# Patient Record
Sex: Female | Born: 1960 | Race: White | Hispanic: No | Smoking: Former smoker
Health system: Southern US, Community
[De-identification: ages and names within clinical notes are randomized; demographics above are authoritative.]

## PROBLEM LIST (undated history)

## (undated) DIAGNOSIS — Z973 Presence of spectacles and contact lenses: Secondary | ICD-10-CM

## (undated) DIAGNOSIS — N12 Tubulo-interstitial nephritis, not specified as acute or chronic: Secondary | ICD-10-CM

## (undated) DIAGNOSIS — M1990 Unspecified osteoarthritis, unspecified site: Secondary | ICD-10-CM

## (undated) DIAGNOSIS — E039 Hypothyroidism, unspecified: Secondary | ICD-10-CM

## (undated) DIAGNOSIS — G4733 Obstructive sleep apnea (adult) (pediatric): Secondary | ICD-10-CM

## (undated) DIAGNOSIS — E7800 Pure hypercholesterolemia, unspecified: Secondary | ICD-10-CM

## (undated) DIAGNOSIS — K589 Irritable bowel syndrome without diarrhea: Secondary | ICD-10-CM

## (undated) DIAGNOSIS — Z8616 Personal history of COVID-19: Secondary | ICD-10-CM

## (undated) DIAGNOSIS — M5416 Radiculopathy, lumbar region: Secondary | ICD-10-CM

## (undated) DIAGNOSIS — F32A Depression, unspecified: Secondary | ICD-10-CM

## (undated) DIAGNOSIS — F419 Anxiety disorder, unspecified: Secondary | ICD-10-CM

## (undated) DIAGNOSIS — M51369 Other intervertebral disc degeneration, lumbar region without mention of lumbar back pain or lower extremity pain: Secondary | ICD-10-CM

## (undated) DIAGNOSIS — M5136 Other intervertebral disc degeneration, lumbar region: Secondary | ICD-10-CM

## (undated) DIAGNOSIS — J449 Chronic obstructive pulmonary disease, unspecified: Secondary | ICD-10-CM

## (undated) DIAGNOSIS — M25512 Pain in left shoulder: Secondary | ICD-10-CM

## (undated) DIAGNOSIS — J189 Pneumonia, unspecified organism: Secondary | ICD-10-CM

## (undated) DIAGNOSIS — E559 Vitamin D deficiency, unspecified: Secondary | ICD-10-CM

## (undated) DIAGNOSIS — Z8619 Personal history of other infectious and parasitic diseases: Secondary | ICD-10-CM

## (undated) DIAGNOSIS — F329 Major depressive disorder, single episode, unspecified: Secondary | ICD-10-CM

## (undated) DIAGNOSIS — I10 Essential (primary) hypertension: Secondary | ICD-10-CM

## (undated) DIAGNOSIS — Z972 Presence of dental prosthetic device (complete) (partial): Secondary | ICD-10-CM

## (undated) DIAGNOSIS — J309 Allergic rhinitis, unspecified: Secondary | ICD-10-CM

## (undated) DIAGNOSIS — M797 Fibromyalgia: Secondary | ICD-10-CM

## (undated) DIAGNOSIS — Z9989 Dependence on other enabling machines and devices: Secondary | ICD-10-CM

## (undated) DIAGNOSIS — Z87448 Personal history of other diseases of urinary system: Secondary | ICD-10-CM

## (undated) DIAGNOSIS — M961 Postlaminectomy syndrome, not elsewhere classified: Secondary | ICD-10-CM

## (undated) DIAGNOSIS — F4310 Post-traumatic stress disorder, unspecified: Secondary | ICD-10-CM

## (undated) DIAGNOSIS — E669 Obesity, unspecified: Secondary | ICD-10-CM

## (undated) DIAGNOSIS — M5030 Other cervical disc degeneration, unspecified cervical region: Secondary | ICD-10-CM

## (undated) DIAGNOSIS — M545 Low back pain, unspecified: Secondary | ICD-10-CM

## (undated) DIAGNOSIS — M65312 Trigger thumb, left thumb: Secondary | ICD-10-CM

## (undated) DIAGNOSIS — J42 Unspecified chronic bronchitis: Secondary | ICD-10-CM

## (undated) DIAGNOSIS — K08109 Complete loss of teeth, unspecified cause, unspecified class: Secondary | ICD-10-CM

## (undated) DIAGNOSIS — G629 Polyneuropathy, unspecified: Secondary | ICD-10-CM

## (undated) DIAGNOSIS — E119 Type 2 diabetes mellitus without complications: Secondary | ICD-10-CM

## (undated) DIAGNOSIS — G8929 Other chronic pain: Secondary | ICD-10-CM

## (undated) DIAGNOSIS — E1142 Type 2 diabetes mellitus with diabetic polyneuropathy: Secondary | ICD-10-CM

## (undated) DIAGNOSIS — K219 Gastro-esophageal reflux disease without esophagitis: Secondary | ICD-10-CM

## (undated) DIAGNOSIS — I2510 Atherosclerotic heart disease of native coronary artery without angina pectoris: Secondary | ICD-10-CM

## (undated) DIAGNOSIS — G894 Chronic pain syndrome: Secondary | ICD-10-CM

## (undated) DIAGNOSIS — E785 Hyperlipidemia, unspecified: Secondary | ICD-10-CM

## (undated) DIAGNOSIS — J45909 Unspecified asthma, uncomplicated: Secondary | ICD-10-CM

## (undated) HISTORY — DX: Polyneuropathy, unspecified: G62.9

## (undated) HISTORY — DX: Major depressive disorder, single episode, unspecified: F32.9

## (undated) HISTORY — PX: CHOLECYSTECTOMY: SHX55

## (undated) HISTORY — DX: Obesity, unspecified: E66.9

## (undated) HISTORY — PX: DILATION AND CURETTAGE OF UTERUS: SHX78

## (undated) HISTORY — PX: BACK SURGERY: SHX140

## (undated) HISTORY — DX: Gastro-esophageal reflux disease without esophagitis: K21.9

## (undated) HISTORY — PX: LAPAROSCOPIC CHOLECYSTECTOMY: SUR755

## (undated) HISTORY — DX: Post-traumatic stress disorder, unspecified: F43.10

## (undated) HISTORY — PX: COLONOSCOPY W/ POLYPECTOMY: SHX1380

## (undated) HISTORY — PX: UPPER GASTROINTESTINAL ENDOSCOPY: SHX188

## (undated) HISTORY — DX: Vitamin D deficiency, unspecified: E55.9

## (undated) HISTORY — DX: Allergic rhinitis, unspecified: J30.9

## (undated) HISTORY — DX: Hyperlipidemia, unspecified: E78.5

## (undated) HISTORY — DX: Essential (primary) hypertension: I10

## (undated) HISTORY — DX: Atherosclerotic heart disease of native coronary artery without angina pectoris: I25.10

## (undated) HISTORY — PX: JOINT REPLACEMENT: SHX530

## (undated) HISTORY — PX: TUBAL LIGATION: SHX77

## (undated) HISTORY — PX: TONSILLECTOMY: SUR1361

## (undated) HISTORY — PX: TRIGGER FINGER RELEASE: SHX641

## (undated) HISTORY — PX: CARPAL TUNNEL RELEASE: SHX101

---
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
## 1995-01-28 HISTORY — PX: LUMBAR DISC SURGERY: SHX700

## 2000-10-23 ENCOUNTER — Encounter: Admission: RE | Admit: 2000-10-23 | Discharge: 2000-10-23 | Payer: Self-pay

## 2001-07-22 ENCOUNTER — Ambulatory Visit (HOSPITAL_BASED_OUTPATIENT_CLINIC_OR_DEPARTMENT_OTHER): Admission: RE | Admit: 2001-07-22 | Discharge: 2001-07-22 | Payer: Self-pay | Admitting: Orthopedic Surgery

## 2001-07-22 HISTORY — PX: CARPAL TUNNEL RELEASE: SHX101

## 2002-11-28 ENCOUNTER — Encounter: Admission: RE | Admit: 2002-11-28 | Discharge: 2002-11-28 | Payer: Self-pay

## 2002-12-15 ENCOUNTER — Encounter
Admission: RE | Admit: 2002-12-15 | Discharge: 2003-03-15 | Payer: Self-pay | Admitting: Physical Medicine & Rehabilitation

## 2003-01-05 ENCOUNTER — Ambulatory Visit (HOSPITAL_BASED_OUTPATIENT_CLINIC_OR_DEPARTMENT_OTHER): Admission: RE | Admit: 2003-01-05 | Discharge: 2003-01-05 | Payer: Self-pay | Admitting: Orthopedic Surgery

## 2003-01-05 HISTORY — PX: CARPAL TUNNEL RELEASE: SHX101

## 2004-01-28 HISTORY — PX: RECTAL PROLAPSE REPAIR: SHX759

## 2004-01-28 HISTORY — PX: BLADDER SUSPENSION: SHX72

## 2004-01-28 HISTORY — PX: ABDOMINAL HYSTERECTOMY: SHX81

## 2006-03-20 ENCOUNTER — Encounter: Admission: RE | Admit: 2006-03-20 | Discharge: 2006-03-20 | Payer: Self-pay | Admitting: Neurosurgery

## 2006-12-08 ENCOUNTER — Encounter: Admission: RE | Admit: 2006-12-08 | Discharge: 2006-12-08 | Payer: Self-pay | Admitting: Neurosurgery

## 2006-12-26 ENCOUNTER — Encounter: Admission: RE | Admit: 2006-12-26 | Discharge: 2006-12-26 | Payer: Self-pay | Admitting: Neurosurgery

## 2010-02-16 ENCOUNTER — Encounter: Payer: Self-pay | Admitting: Orthopedic Surgery

## 2010-02-17 ENCOUNTER — Encounter: Payer: Self-pay | Admitting: Neurosurgery

## 2010-06-14 NOTE — Op Note (Signed)
[place]. [facility]  Patient:    [name], [name] Visit Number: [id number] MRN: [id number]          Service Type: DSU Location: [facility] Attending Physician:  Colbert Ewing Dictated by:   Loreta Ave, M.D. Proc. Date: 07/22/01 Admit Date:  07/22/2001                             Operative Report  PREOPERATIVE DIAGNOSIS:  Right carpal tunnel syndrome.  POSTOPERATIVE DIAGNOSIS:  Right carpal tunnel syndrome.  PROCEDURE:  Right carpal tunnel release.  SURGEON:  Loreta Ave, M.D.  ASSISTANT:  Arlys John D. Petrarca, P.A.-C.  ANESTHESIA:  IV regional.  SPECIMENS:  None.  CULTURES:  None.  COMPLICATIONS:  None.  DRESSING:  Soft compressive with bulky hand dressing and splint.  DESCRIPTION OF PROCEDURE:  Patient brought to the operating room and placed on the operating table in the supine position.  After adequate anesthesia had been obtained, the right arm was prepped and draped in the usual sterile fashion.  A small curved incision over the carpal tunnel heading slightly ulnarward at the distal wrist crease to avoid the palmar branch of the median nerve.  Skin and subcutaneous tissue divided.  Retinaculum over the carpal tunnel exposed and incised under direct visualization from the forearm fascia proximally to the palmar arch distally.  Very tight canal.  Even after release of the retinaculum, marked hourglass constriction of the nerve.  Digital branches, motor branches identified, protected, decompressed.  Epineurotomy, which improved the appearance of the nerve but it still looked a little hourglass with erythema even after that.  No other abnormalities in the carpal canal.  The wound irrigated.  Skin closed with nylon.  Margins of the wound injected with Marcaine.  A sterile compressive dressing with a bulky hand dressing and splint applied.  Anesthesia reversed.  Brought to the recovery room.  Tolerated the surgery well with no  complications. Dictated by:   Loreta Ave, M.D. Attending Physician:  Colbert Ewing DD:  07/22/01 TD:  07/24/01 Job: [id number]

## 2010-06-14 NOTE — Op Note (Signed)
NAMEMarland Kitchen  [name], [name] NO.:  [id number]   MEDICAL RECORD NO.:  [id number]                   PATIENT TYPE:  AMB   LOCATION:  [facility]                                  FACILITY:  [facility]   PHYSICIAN:  Loreta Ave, M.D.              DATE OF BIRTH:  Jun 18, 1960   DATE OF PROCEDURE:  01/05/2003  DATE OF DISCHARGE:                                 OPERATIVE REPORT   PREOPERATIVE DIAGNOSIS:  Left carpal syndrome.   POSTOPERATIVE DIAGNOSIS:  Left carpal tunnel syndrome.   OPERATIVE PROCEDURE:  Left carpal tunnel release.   SURGEON:  Loreta Ave, M.D.   ANESTHESIA:  IV regional.   SPECIMENS:  None.   CULTURES:  None.   COMPLICATIONS:  None.   DRESSINGS:  Soft compressive, bulky hand dressing and splint.   PROCEDURE:  The patient was brought to the operating room and placed on the  operating table in supine position.  After adequate anesthesia had been  obtained, prepped and draped in the usual sterile fashion.  A small curved  incision over the carpal tunnel extending slightly ulnar at the distal wrist  crease avoiding injury to the palmar branch of the median nerve.  The skin  and subcutaneous tissue were divided.  The retinaculum over the carpal  tunnel exposed and incised under direct visualization from the forearm  fascia proximally.  Thickened fibrotic material in the canal debrided.  Moderate hour glass constriction with erythema on the median nerve right in  the middle of the canal.  Improved after carpal tunnel release and  epineurotomy.  Nice release throughout.  Digital branches and motor branches  identified and protected, decompressed.  The wound was irrigated.  The skin  was closed with nylon.  The margins were injected with Marcaine.  Sterile  compressive dressing and splint applied.  Anesthesia reversed.  Brought to  the recovery room.  Tolerated the surgery well without complications.     Loreta Ave, M.D.    DFM/MEDQ  D:  01/05/2003  T:  01/05/2003  Job:  [id number]

## 2011-04-02 DIAGNOSIS — G4733 Obstructive sleep apnea (adult) (pediatric): Secondary | ICD-10-CM

## 2011-04-02 HISTORY — DX: Obstructive sleep apnea (adult) (pediatric): G47.33

## 2012-05-30 ENCOUNTER — Encounter (HOSPITAL_COMMUNITY): Payer: Self-pay

## 2012-05-30 ENCOUNTER — Emergency Department (HOSPITAL_COMMUNITY)
Admission: EM | Admit: 2012-05-30 | Discharge: 2012-05-30 | Disposition: A | Discharge status: Home / Self Care | Payer: Medicaid Other | Attending: Emergency Medicine | Admitting: Emergency Medicine

## 2012-05-30 ENCOUNTER — Emergency Department (HOSPITAL_COMMUNITY): Payer: Medicaid Other

## 2012-05-30 DIAGNOSIS — G8929 Other chronic pain: Secondary | ICD-10-CM | POA: Insufficient documentation

## 2012-05-30 DIAGNOSIS — M25559 Pain in unspecified hip: Secondary | ICD-10-CM | POA: Insufficient documentation

## 2012-05-30 DIAGNOSIS — W010XXA Fall on same level from slipping, tripping and stumbling without subsequent striking against object, initial encounter: Secondary | ICD-10-CM | POA: Insufficient documentation

## 2012-05-30 DIAGNOSIS — Y929 Unspecified place or not applicable: Secondary | ICD-10-CM | POA: Insufficient documentation

## 2012-05-30 DIAGNOSIS — J449 Chronic obstructive pulmonary disease, unspecified: Secondary | ICD-10-CM | POA: Insufficient documentation

## 2012-05-30 DIAGNOSIS — E039 Hypothyroidism, unspecified: Secondary | ICD-10-CM | POA: Insufficient documentation

## 2012-05-30 DIAGNOSIS — M1990 Unspecified osteoarthritis, unspecified site: Secondary | ICD-10-CM

## 2012-05-30 DIAGNOSIS — IMO0001 Reserved for inherently not codable concepts without codable children: Secondary | ICD-10-CM | POA: Insufficient documentation

## 2012-05-30 DIAGNOSIS — E1149 Type 2 diabetes mellitus with other diabetic neurological complication: Secondary | ICD-10-CM | POA: Insufficient documentation

## 2012-05-30 DIAGNOSIS — M549 Dorsalgia, unspecified: Secondary | ICD-10-CM | POA: Insufficient documentation

## 2012-05-30 DIAGNOSIS — R209 Unspecified disturbances of skin sensation: Secondary | ICD-10-CM | POA: Insufficient documentation

## 2012-05-30 DIAGNOSIS — J4489 Other specified chronic obstructive pulmonary disease: Secondary | ICD-10-CM | POA: Insufficient documentation

## 2012-05-30 DIAGNOSIS — M5030 Other cervical disc degeneration, unspecified cervical region: Secondary | ICD-10-CM | POA: Insufficient documentation

## 2012-05-30 DIAGNOSIS — M51379 Other intervertebral disc degeneration, lumbosacral region without mention of lumbar back pain or lower extremity pain: Secondary | ICD-10-CM | POA: Insufficient documentation

## 2012-05-30 DIAGNOSIS — M79609 Pain in unspecified limb: Secondary | ICD-10-CM | POA: Insufficient documentation

## 2012-05-30 DIAGNOSIS — Y9389 Activity, other specified: Secondary | ICD-10-CM | POA: Insufficient documentation

## 2012-05-30 DIAGNOSIS — M5137 Other intervertebral disc degeneration, lumbosacral region: Secondary | ICD-10-CM | POA: Insufficient documentation

## 2012-05-30 DIAGNOSIS — Z87891 Personal history of nicotine dependence: Secondary | ICD-10-CM | POA: Insufficient documentation

## 2012-05-30 DIAGNOSIS — G4730 Sleep apnea, unspecified: Secondary | ICD-10-CM | POA: Insufficient documentation

## 2012-05-30 HISTORY — DX: Chronic obstructive pulmonary disease, unspecified: J44.9

## 2012-05-30 HISTORY — DX: Hypothyroidism, unspecified: E03.9

## 2012-05-30 HISTORY — DX: Other intervertebral disc degeneration, lumbar region without mention of lumbar back pain or lower extremity pain: M51.369

## 2012-05-30 HISTORY — DX: Other cervical disc degeneration, unspecified cervical region: M50.30

## 2012-05-30 HISTORY — DX: Unspecified asthma, uncomplicated: J45.909

## 2012-05-30 HISTORY — DX: Fibromyalgia: M79.7

## 2012-05-30 HISTORY — DX: Other intervertebral disc degeneration, lumbar region: M51.36

## 2012-05-30 MED ORDER — KETOROLAC TROMETHAMINE 30 MG/ML IJ SOLN
30.0000 mg | Freq: Once | INTRAMUSCULAR | Status: AC
  Administered 2012-05-30: 30 mg via INTRAVENOUS
  Filled 2012-05-30: qty 1

## 2012-05-30 MED ORDER — HYDROMORPHONE HCL PF 1 MG/ML IJ SOLN
1.0000 mg | Freq: Once | INTRAMUSCULAR | Status: AC
  Administered 2012-05-30: 1 mg via INTRAVENOUS
  Filled 2012-05-30: qty 1

## 2012-05-30 MED ORDER — DIAZEPAM 5 MG/ML IJ SOLN
5.0000 mg | Freq: Once | INTRAMUSCULAR | Status: AC
  Administered 2012-05-30: 5 mg via INTRAVENOUS
  Filled 2012-05-30: qty 2

## 2012-05-30 NOTE — ED Notes (Signed)
Patient presents with c/o left hip pain. Reports that she "tripped" yesterday while walking but "caught" herself. Denies falling. No immediate pain afterwards. Woke up this AM with pain to left hip that has progressively gotten worse throughout the day. Pain is from the hip and radiates down to the posterior and lateral aspect of leg, into the knee and ankle. Endorses some numbness and tingling to LLE "at times." Patient has hx chronic pain d/t DDD and fibromyalgia. Patient receives narcotic pain medication from pain clinic because of this. Takes Morphine 30 mg BID and Lyrica TID. Patient reports no relief in current pain with prescribed medications. To note, patient has an appointment at pain clinic tomorrow.

## 2012-05-30 NOTE — ED Provider Notes (Signed)
History     CSN: [id number]  Arrival date & time 05/30/12  1940   First MD Initiated Contact with Patient 05/30/12 2034      Chief Complaint  Patient presents with  . Hip Pain    (Consider location/radiation/quality/duration/timing/severity/associated sxs/prior treatment) The history is provided by the patient.  [name] is a 52 y.o. female history of arthritis, fibromyalgia, COPD, chronic pain here presenting with left hip pain. She tripped over something yesterday but caught herself. Did not fall on her buttocks or her head. However today she had increasing pain when she ambulates. She had some tingling down her left leg but denies any weakness. She has a pain specialist and took her pain medicines without relief. Denies any groin numbness or difficulty urinating.    Past Medical History  Diagnosis Date  . DDD (degenerative disc disease), cervical   . DDD (degenerative disc disease), lumbar   . Fibromyalgia   . Diabetes mellitus without complication   . COPD (chronic obstructive pulmonary disease)   . Neuropathy   . Hypothyroid   . Asthma   . Chronic pain   . Sleep apnea     w/ CPAP    Past Surgical History  Procedure Laterality Date  . Back surgery    . Abdominal hysterectomy    . Tubal ligation    . Bladder surgery    . Rectal surgery    . Carpal tunnel release Bilateral   . Cholecystectomy      No family history on file.  History  Substance Use Topics  . Smoking status: Former Smoker    Types: Cigarettes    Quit date: 12/01/2007  . Smokeless tobacco: Never Used  . Alcohol Use: No    OB History   Grav Para Term Preterm Abortions TAB SAB Ect Mult Living                  Review of Systems  Musculoskeletal: Positive for back pain.       L leg paresthesia   All other systems reviewed and are negative.    Allergies  Latex  Home Medications  No current outpatient prescriptions on file.  BP 144/70  Pulse 94  Temp(Src) 98.2 F  (36.8 C) (Oral)  Resp 16  SpO2 96%  Physical Exam  Nursing note and vitals reviewed. Constitutional: She is oriented to person, place, and time.  Uncomfortable   HENT:  Head: Normocephalic.  Mouth/Throat: Oropharynx is clear and moist.  Eyes: Conjunctivae are normal. Pupils are equal, round, and reactive to light.  Neck: Normal range of motion. Neck supple.  Cardiovascular: Normal rate, regular rhythm and normal heart sounds.   Pulmonary/Chest: Effort normal and breath sounds normal. No respiratory distress. She has no wheezes. She has no rales.  Abdominal: Soft. Bowel sounds are normal. She exhibits no distension. There is no tenderness. There is no rebound and no guarding.  Musculoskeletal:  Dec L hip ROM from pain. 2+ pulses, nl sensation. No midline lumbar tenderness.   Neurological: She is alert and oriented to person, place, and time.  No saddle anesthesia   Skin: Skin is warm and dry.  Psychiatric: She has a normal mood and affect. Her behavior is normal. Judgment and thought content normal.    ED Course  Procedures (including critical care time)  Labs Reviewed - No data to display Dg Lumbar Spine Complete  05/30/2012  *RADIOLOGY REPORT*  Clinical Data: Fall, back pain, left hip pain.  LUMBAR SPINE -  COMPLETE 4+ VIEW  Comparison: Lumbar spine MRI 12/26/2006  Findings: Mild degenerative facet disease in the mid and lower lumbar spine.  Early degenerative disc disease changes in the lower lumbar spine.  Minimal spurring anteriorly.  No fracture.  No malalignment.  SI joints are symmetric and unremarkable.  IMPRESSION: Mild degenerative changes as above.  No acute findings.   Original Report Authenticated By: Charlett Nose, M.D.    Dg Hip Complete Left  05/30/2012  *RADIOLOGY REPORT*  Clinical Data: The left hip pain, chronic.  LEFT HIP - COMPLETE 2+ VIEW  Comparison: MRI 03/20/2006  Findings: Degenerative changes within the hips bilaterally, left greater than right.  Joint space  loss, spurring and subchondral sclerosis.  SI joints are symmetric and unremarkable. No acute bony abnormality.  Specifically, no fracture, subluxation, or dislocation.  Soft tissues are intact.  IMPRESSION: Moderate degenerative changes in the hips bilaterally, left greater than right.  No acute findings.   Original Report Authenticated By: Charlett Nose, M.D.      No diagnosis found.    MDM  [name] is a 52 y.o. female here with worsening of chronic leg pain. Xrays showed arthritis. No red flags requiring MRI. Will give pain meds and reassess.   10:28 PM Pain improved with toradol, valium and dilaudid. She will see her pain specialist tomorrow to get her refill for pain meds.         Richardean Canal, MD 05/30/12 2229

## 2012-11-17 ENCOUNTER — Encounter (HOSPITAL_COMMUNITY): Payer: Self-pay | Admitting: Pharmacy Technician

## 2012-11-18 NOTE — H&P (Signed)
HISTORY AND PHYSICAL  [name] is a 52 y.o. female patient with CC: Painful teeth  No diagnosis found.  Past Medical History  Diagnosis Date  . DDD (degenerative disc disease), cervical   . DDD (degenerative disc disease), lumbar   . Fibromyalgia   . Diabetes mellitus without complication   . COPD (chronic obstructive pulmonary disease)   . Neuropathy   . Hypothyroid   . Asthma   . Chronic pain   . Sleep apnea     w/ CPAP    No current facility-administered medications for this encounter.   Current Outpatient Prescriptions  Medication Sig Dispense Refill  . bismuth-metronidazole-tetracycline (PYLERA) 140-125-125 MG per capsule Take 3 capsules by mouth 4 (four) times daily -  before meals and at bedtime.      Marland Kitchen escitalopram (LEXAPRO) 20 MG tablet Take 20 mg by mouth daily.      Marland Kitchen FIBER COMPLETE PO Take 625 mg by mouth 2 (two) times daily.      Marland Kitchen glyBURIDE (DIABETA) 5 MG tablet Take 5 mg by mouth 2 (two) times daily with a meal.      . levothyroxine (SYNTHROID, LEVOTHROID) 50 MCG tablet Take 50 mcg by mouth daily before breakfast.      . metFORMIN (GLUCOPHAGE) 500 MG tablet Take 500 mg by mouth 2 (two) times daily with a meal.      . morphine (MS CONTIN) 30 MG 12 hr tablet Take 30 mg by mouth 2 (two) times daily.      . pantoprazole (PROTONIX) 40 MG tablet Take 40 mg by mouth 2 (two) times daily.      . temazepam (RESTORIL) 30 MG capsule Take 30 mg by mouth at bedtime as needed for sleep.       Allergies  Allergen Reactions  . Latex Itching   Active Problems:   * No active hospital problems. *  Vitals: There were no vitals taken for this visit. Lab results:No results found for this or any previous visit (from the past 24 hour(s)). Radiology Results: No results found. General appearance: alert, cooperative and moderately obese Head: Normocephalic, without obvious abnormality, atraumatic Eyes: negative Ears: normal TM's and external ear canals both  ears Nose: Nares normal. Septum midline. Mucosa normal. No drainage or sinus tenderness. Throat: Dental caries teeth # 4, 5, 6, 10, 11, 12, 13, 15, 21, 22, 27, 28, 31, 32 Neck: no adenopathy, supple, symmetrical, trachea midline and thyroid not enlarged, symmetric, no tenderness/mass/nodules Resp: clear to auscultation bilaterally Cardio: regular rate and rhythm, S1, S2 normal, no murmur, click, rub or gallop  Assessment: Non-restorable teeth #'s 4, 5, 6, 10, 11, 12, 13, 15, 21, 22, 27, 28, 31, 32.   Plan: extraction teeth #'s 4, 5, 6, 10, 11, 12, 13, 15, 21, 22, 27, 28, 31, 32, alveoloplasty. Day surgery. General anesthesia.   [name] 11/18/2012

## 2012-11-23 ENCOUNTER — Other Ambulatory Visit (HOSPITAL_COMMUNITY): Payer: Self-pay | Admitting: *Deleted

## 2012-11-23 ENCOUNTER — Encounter (HOSPITAL_COMMUNITY)
Admission: RE | Admit: 2012-11-23 | Discharge: 2012-11-23 | Disposition: A | Discharge status: Home / Self Care | Payer: Medicaid Other | Source: Ambulatory Visit | Attending: Oral Surgery | Admitting: Oral Surgery

## 2012-11-23 ENCOUNTER — Ambulatory Visit (HOSPITAL_COMMUNITY)
Admission: RE | Admit: 2012-11-23 | Discharge: 2012-11-23 | Disposition: A | Discharge status: Home / Self Care | Payer: Medicaid Other | Source: Ambulatory Visit | Attending: Oral Surgery | Admitting: Oral Surgery

## 2012-11-23 ENCOUNTER — Encounter (HOSPITAL_COMMUNITY): Payer: Self-pay

## 2012-11-23 DIAGNOSIS — Z01812 Encounter for preprocedural laboratory examination: Secondary | ICD-10-CM | POA: Insufficient documentation

## 2012-11-23 DIAGNOSIS — J449 Chronic obstructive pulmonary disease, unspecified: Secondary | ICD-10-CM | POA: Insufficient documentation

## 2012-11-23 DIAGNOSIS — J4489 Other specified chronic obstructive pulmonary disease: Secondary | ICD-10-CM | POA: Insufficient documentation

## 2012-11-23 DIAGNOSIS — Z01818 Encounter for other preprocedural examination: Secondary | ICD-10-CM | POA: Insufficient documentation

## 2012-11-23 HISTORY — DX: Depression, unspecified: F32.A

## 2012-11-23 HISTORY — DX: Irritable bowel syndrome, unspecified: K58.9

## 2012-11-23 HISTORY — DX: Anxiety disorder, unspecified: F41.9

## 2012-11-23 HISTORY — DX: Major depressive disorder, single episode, unspecified: F32.9

## 2012-11-23 LAB — BASIC METABOLIC PANEL
BUN: 17 mg/dL (ref 6–23)
Chloride: 102 mEq/L (ref 96–112)
GFR calc Af Amer: >90 mL/min (ref >90)
GFR calc non Af Amer: >90 mL/min (ref >90)
Glucose, Bld: 88 mg/dL (ref 70–99)
Potassium: 4.1 mEq/L (ref 3.5–5.1)
Sodium: 140 mEq/L (ref 135–145)

## 2012-11-23 LAB — CBC
HCT: 41.8 % (ref 36.0–46.0)
Hemoglobin: 13.8 g/dL (ref 12.0–15.0)
MCH: 30 pg (ref 26.0–34.0)
RBC: 4.6 MIL/uL (ref 3.87–5.11)

## 2012-11-23 NOTE — Pre-Procedure Instructions (Signed)
[name]  11/23/2012   Your procedure is scheduled on:  Friday, November 26, 2012 at 9:50 AM.   Report to [facility] Entrance "A" at 7:45 AM.   Call this number if you have problems the morning of surgery: [phone]   Remember:   Do not eat food or drink liquids after midnight Thursday, 11/15/12.   Take these medicines the morning of surgery with A SIP OF WATER: escitalopram (LEXAPRO), levothyroxine (SYNTHROID, LEVOTHROID), morphine (MS CONTIN), pantoprazole (PROTONIX)                 Do not wear jewelry, make-up or nail polish.  Do not wear lotions, powders, or perfumes. You may wear deodorant.  Do not shave 48 hours prior to surgery. .  Do not bring valuables to the hospital.  [facility] is not responsible                  for any belongings or valuables.               Contacts, dentures or bridgework may not be worn into surgery.  Leave suitcase in the car. After surgery it may be brought to your room.  For patients admitted to the hospital, discharge time is determined by your                treatment team.               Patients discharged the day of surgery will not be allowed to drive  home.  Name and phone number of your driver: Family/friend   Special Instructions: Shower using CHG 2 nights before surgery and the night before surgery.  If you shower the day of surgery use CHG.  Use special wash - you have one bottle of CHG for all showers.  You should use approximately 1/3 of the bottle for each shower.   Please read over the following fact sheets that you were given: Pain Booklet, Coughing and Deep Breathing and Surgical Site Infection Prevention

## 2012-11-24 NOTE — Progress Notes (Signed)
Message left at [facility] pulmonary to request sleep study and stress test.  EKG place in chart from [facility].

## 2012-11-25 MED ORDER — CEFAZOLIN SODIUM-DEXTROSE 2-3 GM-% IV SOLR
2.0000 g | INTRAVENOUS | Status: AC
  Administered 2012-11-26: 2 g via INTRAVENOUS
  Filled 2012-11-25: qty 50

## 2012-11-25 NOTE — Progress Notes (Signed)
Sleep study received and placed on chart.  No stress test received.

## 2012-11-26 ENCOUNTER — Ambulatory Visit (HOSPITAL_COMMUNITY): Payer: Medicaid Other | Admitting: Anesthesiology

## 2012-11-26 ENCOUNTER — Encounter (HOSPITAL_COMMUNITY): Payer: Self-pay | Admitting: Certified Registered"

## 2012-11-26 ENCOUNTER — Ambulatory Visit (HOSPITAL_COMMUNITY)
Admission: RE | Admit: 2012-11-26 | Discharge: 2012-11-26 | Disposition: A | Discharge status: Home / Self Care | Payer: Medicaid Other | Source: Ambulatory Visit | Attending: Oral Surgery | Admitting: Oral Surgery

## 2012-11-26 ENCOUNTER — Encounter (HOSPITAL_COMMUNITY)
Admission: RE | Disposition: A | Discharge status: Home / Self Care | Payer: Self-pay | Source: Ambulatory Visit | Attending: Oral Surgery

## 2012-11-26 ENCOUNTER — Encounter (HOSPITAL_COMMUNITY): Payer: Medicaid Other | Admitting: Anesthesiology

## 2012-11-26 DIAGNOSIS — IMO0001 Reserved for inherently not codable concepts without codable children: Secondary | ICD-10-CM | POA: Insufficient documentation

## 2012-11-26 DIAGNOSIS — K219 Gastro-esophageal reflux disease without esophagitis: Secondary | ICD-10-CM | POA: Insufficient documentation

## 2012-11-26 DIAGNOSIS — K029 Dental caries, unspecified: Secondary | ICD-10-CM

## 2012-11-26 DIAGNOSIS — K0530 Chronic periodontitis, unspecified: Secondary | ICD-10-CM

## 2012-11-26 DIAGNOSIS — J449 Chronic obstructive pulmonary disease, unspecified: Secondary | ICD-10-CM | POA: Insufficient documentation

## 2012-11-26 DIAGNOSIS — E119 Type 2 diabetes mellitus without complications: Secondary | ICD-10-CM | POA: Insufficient documentation

## 2012-11-26 DIAGNOSIS — M2749 Other cysts of jaw: Secondary | ICD-10-CM | POA: Insufficient documentation

## 2012-11-26 DIAGNOSIS — E039 Hypothyroidism, unspecified: Secondary | ICD-10-CM | POA: Insufficient documentation

## 2012-11-26 DIAGNOSIS — K0889 Other specified disorders of teeth and supporting structures: Secondary | ICD-10-CM | POA: Insufficient documentation

## 2012-11-26 DIAGNOSIS — F411 Generalized anxiety disorder: Secondary | ICD-10-CM | POA: Insufficient documentation

## 2012-11-26 DIAGNOSIS — M2740 Unspecified cyst of jaw: Secondary | ICD-10-CM

## 2012-11-26 DIAGNOSIS — J4489 Other specified chronic obstructive pulmonary disease: Secondary | ICD-10-CM | POA: Insufficient documentation

## 2012-11-26 DIAGNOSIS — F329 Major depressive disorder, single episode, unspecified: Secondary | ICD-10-CM | POA: Insufficient documentation

## 2012-11-26 DIAGNOSIS — F3289 Other specified depressive episodes: Secondary | ICD-10-CM | POA: Insufficient documentation

## 2012-11-26 HISTORY — PX: MULTIPLE EXTRACTIONS WITH ALVEOLOPLASTY: SHX5342

## 2012-11-26 LAB — GLUCOSE, CAPILLARY
Glucose-Capillary: 119 mg/dL — ABNORMAL HIGH (ref 70–99)
Glucose-Capillary: 107 mg/dL — ABNORMAL HIGH (ref 70–99)
Glucose-Capillary: 142 mg/dL — ABNORMAL HIGH (ref 70–99)

## 2012-11-26 SURGERY — MULTIPLE EXTRACTION WITH ALVEOLOPLASTY
Anesthesia: General | Wound class: Clean Contaminated

## 2012-11-26 MED ORDER — ONDANSETRON HCL 4 MG/2ML IJ SOLN
INTRAMUSCULAR | Status: DC | PRN
  Administered 2012-11-26: 4 mg via INTRAVENOUS

## 2012-11-26 MED ORDER — ONDANSETRON HCL 4 MG/2ML IJ SOLN: 4.0000 mg | Freq: Once | INTRAMUSCULAR | Status: DC | PRN

## 2012-11-26 MED ORDER — LACTATED RINGERS IV SOLN
INTRAVENOUS | Status: DC
  Administered 2012-11-26: 50 mL/h via INTRAVENOUS

## 2012-11-26 MED ORDER — MIDAZOLAM HCL 5 MG/5ML IJ SOLN
INTRAMUSCULAR | Status: DC | PRN
  Administered 2012-11-26: 2 mg via INTRAVENOUS

## 2012-11-26 MED ORDER — HYDROMORPHONE HCL PF 1 MG/ML IJ SOLN
INTRAMUSCULAR | Status: AC
  Filled 2012-11-26: qty 1

## 2012-11-26 MED ORDER — MEPERIDINE HCL 25 MG/ML IJ SOLN: 6.2500 mg | INTRAMUSCULAR | Status: DC | PRN

## 2012-11-26 MED ORDER — AMOXICILLIN 500 MG PO CAPS: 500.0000 mg | ORAL_CAPSULE | Freq: Three times a day (TID) | ORAL | Status: DC

## 2012-11-26 MED ORDER — OXYCODONE HCL 5 MG PO TABS
5.0000 mg | ORAL_TABLET | Freq: Once | ORAL | Status: AC | PRN
  Administered 2012-11-26: 5 mg via ORAL

## 2012-11-26 MED ORDER — PHENYLEPHRINE HCL 10 MG/ML IJ SOLN
INTRAMUSCULAR | Status: DC | PRN
  Administered 2012-11-26 (×2): 40 ug via INTRAVENOUS

## 2012-11-26 MED ORDER — LIDOCAINE HCL (CARDIAC) 20 MG/ML IV SOLN
INTRAVENOUS | Status: DC | PRN
  Administered 2012-11-26: 80 mg via INTRAVENOUS

## 2012-11-26 MED ORDER — OXYCODONE-ACETAMINOPHEN 10-325 MG PO TABS: 1.0000 | ORAL_TABLET | ORAL | Status: DC | PRN

## 2012-11-26 MED ORDER — LIDOCAINE-EPINEPHRINE 2 %-1:100000 IJ SOLN
INTRAMUSCULAR | Status: AC
  Filled 2012-11-26: qty 1

## 2012-11-26 MED ORDER — HYDROMORPHONE HCL PF 1 MG/ML IJ SOLN
0.2500 mg | INTRAMUSCULAR | Status: DC | PRN
  Administered 2012-11-26 (×4): 0.5 mg via INTRAVENOUS

## 2012-11-26 MED ORDER — SODIUM CHLORIDE 0.9 % IR SOLN
Status: DC | PRN
  Administered 2012-11-26: 1000 mL

## 2012-11-26 MED ORDER — LACTATED RINGERS IV SOLN
INTRAVENOUS | Status: DC | PRN
  Administered 2012-11-26: 10:00:00 via INTRAVENOUS

## 2012-11-26 MED ORDER — LIDOCAINE-EPINEPHRINE 2 %-1:100000 IJ SOLN
INTRAMUSCULAR | Status: DC | PRN
  Administered 2012-11-26: 14 mL

## 2012-11-26 MED ORDER — PROPOFOL 10 MG/ML IV BOLUS
INTRAVENOUS | Status: DC | PRN
  Administered 2012-11-26: 200 mg via INTRAVENOUS

## 2012-11-26 MED ORDER — HYDROMORPHONE HCL PF 1 MG/ML IJ SOLN
INTRAMUSCULAR | Status: AC
  Administered 2012-11-26: 0.5 mg via INTRAVENOUS
  Filled 2012-11-26: qty 1

## 2012-11-26 MED ORDER — OXYCODONE HCL 5 MG PO TABS
ORAL_TABLET | ORAL | Status: AC
  Filled 2012-11-26: qty 1

## 2012-11-26 MED ORDER — FENTANYL CITRATE 0.05 MG/ML IJ SOLN
INTRAMUSCULAR | Status: DC | PRN
  Administered 2012-11-26: 50 ug via INTRAVENOUS
  Administered 2012-11-26: 150 ug via INTRAVENOUS

## 2012-11-26 MED ORDER — OXYCODONE HCL 5 MG/5ML PO SOLN: 5.0000 mg | Freq: Once | ORAL | Status: AC | PRN

## 2012-11-26 MED ORDER — SUCCINYLCHOLINE CHLORIDE 20 MG/ML IJ SOLN
INTRAMUSCULAR | Status: DC | PRN
  Administered 2012-11-26: 100 mg via INTRAVENOUS

## 2012-11-26 SURGICAL SUPPLY — 27 items
BUR CROSS CUT FISSURE 1.6 (BURR) ×2 IMPLANT
BUR EGG ELITE 4.0 (BURR) IMPLANT
CANISTER SUCTION 2500CC (MISCELLANEOUS) ×2 IMPLANT
CLOTH BEACON ORANGE TIMEOUT ST (SAFETY) ×2 IMPLANT
COVER SURGICAL LIGHT HANDLE (MISCELLANEOUS) ×2 IMPLANT
CRADLE DONUT ADULT HEAD (MISCELLANEOUS) ×2 IMPLANT
DECANTER SPIKE VIAL GLASS SM (MISCELLANEOUS) ×2 IMPLANT
GAUZE PACKING FOLDED 2  STR (GAUZE/BANDAGES/DRESSINGS) ×1
GAUZE PACKING FOLDED 2 STR (GAUZE/BANDAGES/DRESSINGS) ×1 IMPLANT
GLOVE BIO SURGEON STRL SZ 6.5 (GLOVE) ×2 IMPLANT
GLOVE BIO SURGEON STRL SZ7.5 (GLOVE) ×2 IMPLANT
GLOVE BIOGEL PI IND STRL 7.0 (GLOVE) ×1 IMPLANT
GLOVE BIOGEL PI INDICATOR 7.0 (GLOVE) ×1
GOWN STRL NON-REIN LRG LVL3 (GOWN DISPOSABLE) ×2 IMPLANT
GOWN STRL REIN XL XLG (GOWN DISPOSABLE) ×2 IMPLANT
KIT BASIN OR (CUSTOM PROCEDURE TRAY) ×2 IMPLANT
KIT ROOM TURNOVER OR (KITS) ×2 IMPLANT
NEEDLE 22X1 1/2 (OR ONLY) (NEEDLE) ×2 IMPLANT
NS IRRIG 1000ML POUR BTL (IV SOLUTION) ×2 IMPLANT
PAD ARMBOARD 7.5X6 YLW CONV (MISCELLANEOUS) ×4 IMPLANT
SUT CHROMIC 3 0 PS 2 (SUTURE) ×2 IMPLANT
SYR CONTROL 10ML LL (SYRINGE) ×2 IMPLANT
TOWEL OR 17X26 10 PK STRL BLUE (TOWEL DISPOSABLE) ×2 IMPLANT
TRAY ENT MC OR (CUSTOM PROCEDURE TRAY) ×2 IMPLANT
TUBING IRRIGATION (MISCELLANEOUS) IMPLANT
WATER STERILE IRR 1000ML POUR (IV SOLUTION) IMPLANT
YANKAUER SUCT BULB TIP NO VENT (SUCTIONS) ×2 IMPLANT

## 2012-11-26 NOTE — Op Note (Signed)
11/26/2012  10:55 AM  PATIENT:  [name]  52 y.o. female  PRE-OPERATIVE DIAGNOSIS:  non restorable teeth #'s 4, 5, 6, 10, 11, 12, 13, 15, 21, 22, 27, 28, 31, 32  POST-OPERATIVE DIAGNOSIS:  SAME + cyst right maxilla  PROCEDURE:  Procedure(s): MULTIPLE EXTRACTION teeth #'s 4, 5, 6, 10, 11, 12, 13, 15, 21, 22, 27, 28, 31, 32 WITH ALVEOLOPLASTY, removal cyst right maxilla  SURGEON:  Surgeon(s): [name], DDS  ANESTHESIA:   local and general  EBL:  minimal  DRAINS: none   SPECIMEN:  Cyst right maxilla  COUNTS:  YES  PLAN OF CARE: Discharge to home after PACU  PATIENT DISPOSITION:  PACU - hemodynamically stable.   PROCEDURE DETAILS: Dictation # [id number]  [name], DMD 11/26/2012 10:55 AM

## 2012-11-26 NOTE — H&P (Signed)
H&P documentation  -History and Physical Reviewed  -Patient has been re-examined  -No change in the plan of care  [name] M  

## 2012-11-26 NOTE — Preoperative (Signed)
Beta Blockers   Reason not to administer Beta Blockers:Not Applicable 

## 2012-11-26 NOTE — Anesthesia Preprocedure Evaluation (Addendum)
Anesthesia Evaluation  Patient identified by MRN, date of birth, ID band Patient awake    Reviewed: Allergy & Precautions, H&P , NPO status , Patient's Chart, lab work & pertinent test results, reviewed documented beta blocker date and time   Airway Mallampati: I TM Distance: >3 FB Neck ROM: Full    Dental  (+) Missing, Dental Advisory Given and Poor Dentition   Pulmonary shortness of breath and with exertion, asthma , sleep apnea , COPD COPD inhaler,  breath sounds clear to auscultation        Cardiovascular Rhythm:Regular     Neuro/Psych Anxiety Depression  Neuromuscular disease    GI/Hepatic GERD-  Medicated and Controlled,  Endo/Other  diabetes, Type 2, Oral Hypoglycemic AgentsHypothyroidism   Renal/GU      Musculoskeletal  (+) Fibromyalgia -  Abdominal (+)  Abdomen: soft. Bowel sounds: normal.  Peds  Hematology   Anesthesia Other Findings   Reproductive/Obstetrics                        Anesthesia Physical Anesthesia Plan  ASA: III  Anesthesia Plan: General   Post-op Pain Management:    Induction: Intravenous  Airway Management Planned: Nasal ETT  Additional Equipment:   Intra-op Plan:   Post-operative Plan: Extubation in OR  Informed Consent: I have reviewed the patients History and Physical, chart, labs and discussed the procedure including the risks, benefits and alternatives for the proposed anesthesia with the patient or authorized representative who has indicated his/her understanding and acceptance.     Plan Discussed with: CRNA and Surgeon  Anesthesia Plan Comments:         Anesthesia Quick Evaluation

## 2012-11-26 NOTE — Anesthesia Procedure Notes (Signed)
Procedure Name: Intubation Date/Time: 11/26/2012 10:11 AM Performed by: Ellin Goodie Pre-anesthesia Checklist: Patient identified, Emergency Drugs available, Patient being monitored, Suction available and Timeout performed Patient Re-evaluated:Patient Re-evaluated prior to inductionOxygen Delivery Method: Circle system utilized Preoxygenation: Pre-oxygenation with 100% oxygen Intubation Type: IV induction Ventilation: Mask ventilation without difficulty Laryngoscope Size: Mac and 3 Grade View: Grade I Nasal Tubes: Nasal prep performed, Nasal Rae, Right and Magill forceps- large, utilized Tube size: 7.0 mm Number of attempts: 1 Placement Confirmation: ETT inserted through vocal cords under direct vision,  positive ETCO2 and breath sounds checked- equal and bilateral Tube secured with: Tape Dental Injury: Teeth and Oropharynx as per pre-operative assessment  Comments: Easy induction and nasal intubation.  MAC 3 blade used to visualize vocal cords and Magill forceps used to pass ETT through cords.  Dr. Michelle Piper verified placement of ETT.  Carlynn Herald, CRNA

## 2012-11-26 NOTE — Transfer of Care (Signed)
Immediate Anesthesia Transfer of Care Note  Patient: [name]  Procedure(s) Performed: Procedure(s): MULTIPLE EXTRACTION WITH ALVEOLOPLASTY (N/A)  Patient Location: PACU  Anesthesia Type:General  Level of Consciousness: awake, alert  and oriented  Airway & Oxygen Therapy: Patient Spontanous Breathing  Post-op Assessment: Report given to PACU RN  Post vital signs: stable  Complications: No apparent anesthesia complications

## 2012-11-26 NOTE — Anesthesia Postprocedure Evaluation (Signed)
Anesthesia Post Note  Patient: [name]  Procedure(s) Performed: Procedure(s) (LRB): MULTIPLE EXTRACTION WITH ALVEOLOPLASTY (N/A)  Anesthesia type: general  Patient location: PACU  Post pain: Pain level controlled  Post assessment: Patient's Cardiovascular Status Stable  Last Vitals:  Filed Vitals:   11/26/12 1215  BP:   Pulse: 80  Temp:   Resp: 24    Post vital signs: Reviewed and stable  Level of consciousness: sedated  Complications: No apparent anesthesia complications

## 2012-11-27 NOTE — Op Note (Signed)
NAME:  [name], [name]  ACCOUNT NO.:  [id number]  MEDICAL RECORD NO.:  [id number]  LOCATION:  [facility]                         FACILITY:  [facility]  PHYSICIAN:  [name], M.D.  DATE OF BIRTH:  Jun 24, 1960  DATE OF PROCEDURE: DATE OF DISCHARGE:  11/26/2012                              OPERATIVE REPORT   PREOPERATIVE DIAGNOSIS:  Nonrestorable teeth numbers 4, 5, 6, 10, 11, 12, 13, 15, 21, 22, 27, 28, 31, 32.  POSTOPERATIVE DIAGNOSIS:  Nonrestorable teeth numbers 4, 5, 6, 10, 11, 12, 13, 15, 21, 22, 27, 28, 31, 32 plus cyst right maxilla.  PROCEDURE:  Extraction of teeth numbers 4, 5, 6, 10, 11, 12, 13, 15, 21, 22, 27, 28, 31, 32.  Alveoplasty of right and left maxilla and mandible, removal of cyst right maxilla.  SURGEON:  [name], M.D.  ANESTHESIA:  General.  Dr. Michelle Piper, attending nasal intubation.  DESCRIPTION OF PROCEDURE:  The patient was taken to the operating room, placed on the table in supine position.  General anesthesia was administered intravenously and a nasal endotracheal tube was placed and secured.  The eyes were protected.  The patient was draped for the procedure.  Time-out was performed.  The posterior pharynx was suctioned.  The throat pack was placed.  A 2% lidocaine with 1:100,000 epinephrine was infiltrated in an inferior alveolar block on the right and left side and buccal and palatal infiltration in the maxilla.  Total of 14 mL was utilized.  The bite block was placed in the right side of the mouth and a sweetheart retractor was used to retract the tongue.  A 15 blade was used to make an incision around teeth numbers 21, 22, on the buccal and lingual surfaces with proximal and distal extensions and wedge excisions in the mandible and then in the maxilla.  A 15 blade used to make an incision around teeth numbers 10, 11, 12, 13, 15.  The periosteum was then reflected from around the teeth and the teeth were elevated with a 301 elevator.  The  lower teeth were removed with Asch forceps and the upper teeth were removed with the rongeur and the #150 universal forceps.  The sockets were curetted and the periosteum was reflected to expose the alveolar crest in the maxilla and mandible on the left side, an egg-shaped bur under irrigation and a bone file was used to perform alveoplasty.  Then, these areas were closed with 3-0 chromic and the bite block and sweetheart retractor were repositioned to the other side of the mouth.  A 15 blade was then used to make an incision around teeth numbers 27, 28, 31, and 32.  On the buccal and lingual aspects in the maxilla around teeth numbers 2, 4, 5, and 6 with a proximal and distal wedge excision to allow for primary closure. Then, the periosteum was reflected and the teeth were elevated with a 301 elevator.  The lower teeth were removed with the Asch forceps in the anterior region and a #151 forceps in the posterior region in the maxilla.  The teeth were removed with the #150 forceps.  Then, the sockets were curetted.  Upon curetting in the area of tooth #6 and 5, there was a large bony defect  apical to the teeth and this area was opened further and found to have a cyst present.  Portions of this cyst was suctioned but another portion of the cyst was removed and submitted for pathology in formalin.  Then, the periosteum was reflected in the maxilla and mandible on the right side.  Then, the bone file and egg- shaped bur were used to perform the alveoplasty.  Then, the mandible was closed with 3-0 chromic after irrigating and the maxilla was closed with 3-0 chromic after irrigating.  Care was taken to perform primary closure in the maxilla so that there was no oral communication with the cyst area.  The oral cavity was then inspected found to have good contour hemostasis and closure.  The oral cavity was irrigated and suctioned. The throat pack was removed.  The patient was awakened, taken to  the recovery room, breathing spontaneously in good condition.  ESTIMATED BLOOD LOSS:  Minimum.  COMPLICATIONS:  None.  SPECIMEN:  Cyst right maxilla.     [name], M.D.     SMJ/MEDQ  D:  11/26/2012  T:  11/27/2012  Job:  [id number]

## 2012-11-30 ENCOUNTER — Encounter (HOSPITAL_COMMUNITY): Payer: Self-pay | Admitting: Oral Surgery

## 2013-04-28 DIAGNOSIS — M1990 Unspecified osteoarthritis, unspecified site: Secondary | ICD-10-CM

## 2013-04-28 DIAGNOSIS — M961 Postlaminectomy syndrome, not elsewhere classified: Secondary | ICD-10-CM

## 2013-04-28 DIAGNOSIS — E119 Type 2 diabetes mellitus without complications: Secondary | ICD-10-CM | POA: Insufficient documentation

## 2013-04-28 DIAGNOSIS — M797 Fibromyalgia: Secondary | ICD-10-CM | POA: Insufficient documentation

## 2013-04-28 DIAGNOSIS — F32A Depression, unspecified: Secondary | ICD-10-CM | POA: Insufficient documentation

## 2013-04-28 HISTORY — DX: Unspecified osteoarthritis, unspecified site: M19.90

## 2013-04-28 HISTORY — DX: Type 2 diabetes mellitus without complications: E11.9

## 2013-04-28 HISTORY — DX: Postlaminectomy syndrome, not elsewhere classified: M96.1

## 2014-03-23 ENCOUNTER — Other Ambulatory Visit: Payer: Self-pay | Admitting: Orthopedic Surgery

## 2014-03-23 ENCOUNTER — Encounter (HOSPITAL_BASED_OUTPATIENT_CLINIC_OR_DEPARTMENT_OTHER): Payer: Self-pay | Admitting: *Deleted

## 2014-03-23 NOTE — Progress Notes (Signed)
[facility]-she had ctr done here 2003-to bring meds and cpap will need istat-called [facility] for ekg

## 2014-03-25 NOTE — H&P (Signed)
[name] is an 54 y.o. female.   CC / Reason for Visit: Right thumb problem HPI: This patient presents for reevaluation of her right thumb having undergone a trigger thumb injection on one-27-16.  She reports that it is not fully well, but much improved.  There is still some slight catching with each flexion cycle, but the pain is significantly better and catching is not as pronounced. She also reports to me some ongoing numbness and tingling in the right small finger and ulnar portion of her ring finger.  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  HPI 03-09-13: This patient is a 54 year old female who presents for evaluation of primarily her left upper extremity.  She reports that she has painful episodes where the index finger and the thumb will cramp, somewhat like making an okay sign.  She doesn't really have much in the way of numbness or tingling with this when it occurs.  She has undergone open carpal tunnel releases bilaterally around 2005-2006.  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  Past Medical History  Diagnosis Date  . DDD (degenerative disc disease), cervical   . DDD (degenerative disc disease), lumbar   . COPD (chronic obstructive pulmonary disease)   . Neuropathy   . Hypothyroid   . Asthma   . Chronic pain   . Depression   . Anxiety   . Shortness of breath     WITH EXERTION   . Diabetes mellitus without complication   . Sleep apnea     w/ CPAP  . GERD (gastroesophageal reflux disease)   . Fibromyalgia   . IBS (irritable bowel syndrome)   . Full dentures   . Wears glasses     Past Surgical History  Procedure Laterality Date  . Back surgery    . Abdominal hysterectomy    . Tubal ligation     . Bladder surgery    . Rectal surgery    . Carpal tunnel release Bilateral   . Cholecystectomy    . Multiple extractions with alveoloplasty N/A 11/26/2012    Procedure: MULTIPLE EXTRACTION WITH ALVEOLOPLASTY;  Surgeon: Georgia LopesScott M Jensen, DDS;  Location: [facility] OR;  Service: Oral Surgery;  Laterality: N/A;    History reviewed. No pertinent family history. Social History:  reports that she quit smoking about 6 years ago. Her smoking use included Cigarettes. She has never used smokeless tobacco. She reports that she does not drink alcohol or use illicit drugs.  Allergies:  Allergies  Allergen Reactions  . Latex Itching    No prescriptions prior to admission    No results found for this or any previous visit (from the past 48 hour(s)). No results found.  Review of Systems  All other systems reviewed and are negative.   Height 5\' 6"  (1.676 m), weight 99.791 kg (220 lb). Physical Exam  Constitutional:  WD, WN, NAD HEENT:  NCAT, EOMI Neuro/Psych:  Alert & oriented to person, place, and time; appropriate mood & affect Lymphatic: No generalized UE edema or lymphadenopathy Extremities / MSK:  Both UE are normal with respect to appearance, ranges of motion, joint stability, muscle strength/tone, sensation, & perfusion except as otherwise noted:  Right thumb mildly tender over the A1 pulley, catching with each flexion cycle.  She has good  interosseus strength and strength and FDP #4 and 5, but subjectively altered sensibility on the small finger and ulnar half of the ring finger.  She can detect a 3.6 1 monofilament across all the digits.  There is achy pain at the medial side of her elbow over the ulnar nerve with percussion, but no radiating paresthesias by her report  Labs / Xrays:  No radiographic studies obtained today.  Assessment: 1.  Right trigger thumb, improving with nonoperative treatment 2.  Right ulnar neuropathy at the elbow  Procedure: The site was prepped with alcohol,  topically anesthetized with ethyl chloride, and  1 mL generic Celestone / 0.5 mL lidocaine was injected through a 22-gauge needle.  A portion of the medication was delivered within the flexor tendon sheath, and the remainder in the subcutaneous space.  A Band-Aid was applied.  Right thumb was injected  Plan:  In addition to reinjecting the thumb, hoping for full resolution with this second injection, she did decide ultimately that she would like to undergo surgical decompression of her right ulnar nerve, which I think is reasonable given the chronicity of her symptoms and the lack of response to this point.  We discussed the details of the release, the possible need for subcutaneous transposition rather than just in situ decompression depending upon the stability of the nerve, as well as sometimes the accessory incision that is created and the medial aspect of the upper arm if needed.  She thinks she would like to go next week, but needs to confirm that with her ride.  She will call Olegario Messier, our surgery coordinator for further planning once she knows her availability.   The details of the operative procedure were discussed with the patient.  Questions were invited and answered.  In addition to the goal of the procedure, the risks of the procedure to include but not limited to bleeding; infection; damage to the nerves or blood vessels that could result in bleeding, numbness, weakness, chronic pain, and the need for additional procedures; stiffness; the need for revision surgery; and anesthetic risks, were reviewed.  No specific outcome was guaranteed or implied.  Informed consent was obtained.  Rotha Cassels A. 03/25/2014, 9:50 PM

## 2014-03-27 ENCOUNTER — Ambulatory Visit (HOSPITAL_BASED_OUTPATIENT_CLINIC_OR_DEPARTMENT_OTHER): Payer: Medicaid Other | Admitting: Certified Registered"

## 2014-03-27 ENCOUNTER — Encounter (HOSPITAL_BASED_OUTPATIENT_CLINIC_OR_DEPARTMENT_OTHER)
Admission: RE | Disposition: A | Discharge status: Home / Self Care | Payer: Self-pay | Source: Ambulatory Visit | Attending: Orthopedic Surgery

## 2014-03-27 ENCOUNTER — Encounter (HOSPITAL_BASED_OUTPATIENT_CLINIC_OR_DEPARTMENT_OTHER): Payer: Self-pay | Admitting: Certified Registered"

## 2014-03-27 ENCOUNTER — Ambulatory Visit (HOSPITAL_BASED_OUTPATIENT_CLINIC_OR_DEPARTMENT_OTHER)
Admission: RE | Admit: 2014-03-27 | Discharge: 2014-03-27 | Disposition: A | Discharge status: Home / Self Care | Payer: Medicaid Other | Source: Ambulatory Visit | Attending: Orthopedic Surgery | Admitting: Orthopedic Surgery

## 2014-03-27 DIAGNOSIS — Z9851 Tubal ligation status: Secondary | ICD-10-CM | POA: Insufficient documentation

## 2014-03-27 DIAGNOSIS — G5622 Lesion of ulnar nerve, left upper limb: Secondary | ICD-10-CM | POA: Insufficient documentation

## 2014-03-27 DIAGNOSIS — J449 Chronic obstructive pulmonary disease, unspecified: Secondary | ICD-10-CM | POA: Diagnosis not present

## 2014-03-27 DIAGNOSIS — E119 Type 2 diabetes mellitus without complications: Secondary | ICD-10-CM | POA: Insufficient documentation

## 2014-03-27 DIAGNOSIS — J45909 Unspecified asthma, uncomplicated: Secondary | ICD-10-CM | POA: Insufficient documentation

## 2014-03-27 DIAGNOSIS — Z91040 Latex allergy status: Secondary | ICD-10-CM | POA: Diagnosis not present

## 2014-03-27 DIAGNOSIS — F419 Anxiety disorder, unspecified: Secondary | ICD-10-CM | POA: Insufficient documentation

## 2014-03-27 DIAGNOSIS — M5136 Other intervertebral disc degeneration, lumbar region: Secondary | ICD-10-CM | POA: Diagnosis not present

## 2014-03-27 DIAGNOSIS — G4730 Sleep apnea, unspecified: Secondary | ICD-10-CM | POA: Diagnosis not present

## 2014-03-27 DIAGNOSIS — F329 Major depressive disorder, single episode, unspecified: Secondary | ICD-10-CM | POA: Diagnosis not present

## 2014-03-27 DIAGNOSIS — Z9049 Acquired absence of other specified parts of digestive tract: Secondary | ICD-10-CM | POA: Diagnosis not present

## 2014-03-27 DIAGNOSIS — E039 Hypothyroidism, unspecified: Secondary | ICD-10-CM | POA: Insufficient documentation

## 2014-03-27 DIAGNOSIS — Z87891 Personal history of nicotine dependence: Secondary | ICD-10-CM | POA: Diagnosis not present

## 2014-03-27 DIAGNOSIS — M5030 Other cervical disc degeneration, unspecified cervical region: Secondary | ICD-10-CM | POA: Insufficient documentation

## 2014-03-27 HISTORY — PX: ULNAR NERVE TRANSPOSITION: SHX2595

## 2014-03-27 HISTORY — DX: Complete loss of teeth, unspecified cause, unspecified class: K08.109

## 2014-03-27 HISTORY — DX: Complete loss of teeth, unspecified cause, unspecified class: Z97.2

## 2014-03-27 HISTORY — DX: Presence of spectacles and contact lenses: Z97.3

## 2014-03-27 LAB — POCT I-STAT, CHEM 8
BUN: 14 mg/dL (ref 6–23)
CALCIUM ION: 1.14 mmol/L (ref 1.12–1.23)
CHLORIDE: 104 mmol/L (ref 96–112)
CREATININE: 0.7 mg/dL (ref 0.50–1.10)
GLUCOSE: 114 mg/dL — AB (ref 70–99)
HCT: 40 % (ref 36.0–46.0)
Hemoglobin: 13.6 g/dL (ref 12.0–15.0)
Potassium: 4 mmol/L (ref 3.5–5.1)
SODIUM: 143 mmol/L (ref 135–145)
TCO2: 24 mmol/L (ref 0–100)

## 2014-03-27 LAB — GLUCOSE, CAPILLARY: Glucose-Capillary: 120 mg/dL — ABNORMAL HIGH (ref 70–99)

## 2014-03-27 SURGERY — ULNAR NERVE DECOMPRESSION/TRANSPOSITION
Anesthesia: General | Site: Elbow | Laterality: Right

## 2014-03-27 MED ORDER — MIDAZOLAM HCL 2 MG/2ML IJ SOLN: 1.0000 mg | INTRAMUSCULAR | Status: DC | PRN

## 2014-03-27 MED ORDER — CEFAZOLIN SODIUM-DEXTROSE 2-3 GM-% IV SOLR
2.0000 g | INTRAVENOUS | Status: AC
  Administered 2014-03-27: 2 g via INTRAVENOUS

## 2014-03-27 MED ORDER — LIDOCAINE HCL (CARDIAC) 20 MG/ML IV SOLN
INTRAVENOUS | Status: DC | PRN
  Administered 2014-03-27: 60 mg via INTRAVENOUS

## 2014-03-27 MED ORDER — PROPOFOL 10 MG/ML IV BOLUS
INTRAVENOUS | Status: DC | PRN
  Administered 2014-03-27: 150 mg via INTRAVENOUS

## 2014-03-27 MED ORDER — OXYCODONE-ACETAMINOPHEN 5-325 MG PO TABS: 1.0000 | ORAL_TABLET | Freq: Four times a day (QID) | ORAL | Status: DC | PRN

## 2014-03-27 MED ORDER — DEXAMETHASONE SODIUM PHOSPHATE 10 MG/ML IJ SOLN
INTRAMUSCULAR | Status: DC | PRN
  Administered 2014-03-27: 4 mg via INTRAVENOUS

## 2014-03-27 MED ORDER — LACTATED RINGERS IV SOLN
INTRAVENOUS | Status: DC
  Administered 2014-03-27 (×2): via INTRAVENOUS

## 2014-03-27 MED ORDER — PROPOFOL 10 MG/ML IV EMUL
INTRAVENOUS | Status: AC
  Filled 2014-03-27: qty 50

## 2014-03-27 MED ORDER — FENTANYL CITRATE 0.05 MG/ML IJ SOLN
INTRAMUSCULAR | Status: AC
  Filled 2014-03-27: qty 6

## 2014-03-27 MED ORDER — OXYCODONE HCL 5 MG PO TABS
5.0000 mg | ORAL_TABLET | Freq: Once | ORAL | Status: AC | PRN
  Administered 2014-03-27: 5 mg via ORAL

## 2014-03-27 MED ORDER — MIDAZOLAM HCL 2 MG/2ML IJ SOLN
INTRAMUSCULAR | Status: AC
  Filled 2014-03-27: qty 2

## 2014-03-27 MED ORDER — FENTANYL CITRATE 0.05 MG/ML IJ SOLN
50.0000 ug | INTRAMUSCULAR | Status: DC | PRN
Start: 2014-03-27 — End: 2014-03-27

## 2014-03-27 MED ORDER — FENTANYL CITRATE 0.05 MG/ML IJ SOLN
INTRAMUSCULAR | Status: DC | PRN
  Administered 2014-03-27: 50 ug via INTRAVENOUS
  Administered 2014-03-27: 25 ug via INTRAVENOUS

## 2014-03-27 MED ORDER — ONDANSETRON HCL 4 MG/2ML IJ SOLN: 4.0000 mg | Freq: Four times a day (QID) | INTRAMUSCULAR | Status: DC | PRN

## 2014-03-27 MED ORDER — LACTATED RINGERS IV SOLN: INTRAVENOUS | Status: DC

## 2014-03-27 MED ORDER — FENTANYL CITRATE 0.05 MG/ML IJ SOLN
INTRAMUSCULAR | Status: AC
  Filled 2014-03-27: qty 2

## 2014-03-27 MED ORDER — MIDAZOLAM HCL 5 MG/5ML IJ SOLN
INTRAMUSCULAR | Status: DC | PRN
  Administered 2014-03-27: 2 mg via INTRAVENOUS

## 2014-03-27 MED ORDER — BUPIVACAINE-EPINEPHRINE 0.5% -1:200000 IJ SOLN
INTRAMUSCULAR | Status: DC | PRN
  Administered 2014-03-27: 20 mL

## 2014-03-27 MED ORDER — BUPIVACAINE-EPINEPHRINE (PF) 0.5% -1:200000 IJ SOLN
INTRAMUSCULAR | Status: AC
  Filled 2014-03-27: qty 30

## 2014-03-27 MED ORDER — CEFAZOLIN SODIUM-DEXTROSE 2-3 GM-% IV SOLR
INTRAVENOUS | Status: AC
  Filled 2014-03-27: qty 50

## 2014-03-27 MED ORDER — FENTANYL CITRATE 0.05 MG/ML IJ SOLN
25.0000 ug | INTRAMUSCULAR | Status: DC | PRN
  Administered 2014-03-27 (×2): 50 ug via INTRAVENOUS

## 2014-03-27 MED ORDER — OXYCODONE HCL 5 MG PO TABS
ORAL_TABLET | ORAL | Status: AC
  Filled 2014-03-27: qty 1

## 2014-03-27 MED ORDER — LIDOCAINE HCL (PF) 1 % IJ SOLN
INTRAMUSCULAR | Status: AC
  Filled 2014-03-27: qty 30

## 2014-03-27 MED ORDER — OXYCODONE HCL 5 MG/5ML PO SOLN: 5.0000 mg | Freq: Once | ORAL | Status: AC | PRN

## 2014-03-27 MED ORDER — ONDANSETRON HCL 4 MG/2ML IJ SOLN
INTRAMUSCULAR | Status: DC | PRN
  Administered 2014-03-27: 4 mg via INTRAVENOUS

## 2014-03-27 SURGICAL SUPPLY — 63 items
APL SKNCLS STERI-STRIP NONHPOA (GAUZE/BANDAGES/DRESSINGS)
BENZOIN TINCTURE PRP APPL 2/3 (GAUZE/BANDAGES/DRESSINGS) ×1 IMPLANT
BLADE MINI RND TIP GREEN BEAV (BLADE) IMPLANT
BLADE SURG 15 STRL LF DISP TIS (BLADE) ×1 IMPLANT
BLADE SURG 15 STRL SS (BLADE) ×3
BNDG CMPR 9X4 STRL LF SNTH (GAUZE/BANDAGES/DRESSINGS)
BNDG COHESIVE 4X5 TAN STRL (GAUZE/BANDAGES/DRESSINGS) ×3 IMPLANT
BNDG ESMARK 4X9 LF (GAUZE/BANDAGES/DRESSINGS) IMPLANT
BNDG GAUZE ELAST 4 BULKY (GAUZE/BANDAGES/DRESSINGS) ×6 IMPLANT
CHLORAPREP W/TINT 26ML (MISCELLANEOUS) ×3 IMPLANT
CLOSURE WOUND 1/2 X4 (GAUZE/BANDAGES/DRESSINGS)
CORDS BIPOLAR (ELECTRODE) ×3 IMPLANT
COVER BACK TABLE 60X90IN (DRAPES) ×3 IMPLANT
COVER MAYO STAND STRL (DRAPES) ×3 IMPLANT
CUFF TOURN SGL LL 18 NRW (TOURNIQUET CUFF) ×3 IMPLANT
CUFF TOURNIQUET SINGLE 18IN (TOURNIQUET CUFF) IMPLANT
DRAIN PENROSE 1/2X12 LTX STRL (WOUND CARE) IMPLANT
DRAIN PENROSE 1/4X12 LTX STRL (WOUND CARE) IMPLANT
DRAPE EXTREMITY T 121X128X90 (DRAPE) ×3 IMPLANT
DRAPE SURG 17X23 STRL (DRAPES) ×3 IMPLANT
DRAPE U-SHAPE 47X51 STRL (DRAPES) IMPLANT
DRSG ADAPTIC 3X8 NADH LF (GAUZE/BANDAGES/DRESSINGS) ×2 IMPLANT
DRSG EMULSION OIL 3X3 NADH (GAUZE/BANDAGES/DRESSINGS) IMPLANT
GAUZE SPONGE 4X4 12PLY STRL (GAUZE/BANDAGES/DRESSINGS) ×3 IMPLANT
GLOVE BIO SURGEON STRL SZ7.5 (GLOVE) ×1 IMPLANT
GLOVE BIOGEL PI IND STRL 7.0 (GLOVE) ×1 IMPLANT
GLOVE BIOGEL PI IND STRL 8 (GLOVE) ×1 IMPLANT
GLOVE BIOGEL PI INDICATOR 7.0 (GLOVE) ×2
GLOVE BIOGEL PI INDICATOR 8 (GLOVE) ×2
GLOVE ECLIPSE 6.5 STRL STRAW (GLOVE) ×1 IMPLANT
GLOVE SURG SS PI 6.5 STRL IVOR (GLOVE) ×4 IMPLANT
GLOVE SURG SS PI 7.5 STRL IVOR (GLOVE) ×2 IMPLANT
GOWN STRL REUS W/ TWL LRG LVL3 (GOWN DISPOSABLE) ×2 IMPLANT
GOWN STRL REUS W/TWL LRG LVL3 (GOWN DISPOSABLE) ×6
GOWN STRL REUS W/TWL XL LVL3 (GOWN DISPOSABLE) ×3 IMPLANT
LOOP VESSEL MAXI BLUE (MISCELLANEOUS) IMPLANT
NDL HYPO 25X1 1.5 SAFETY (NEEDLE) IMPLANT
NEEDLE HYPO 25X1 1.5 SAFETY (NEEDLE) ×3 IMPLANT
NS IRRIG 1000ML POUR BTL (IV SOLUTION) ×3 IMPLANT
PACK BASIN DAY SURGERY FS (CUSTOM PROCEDURE TRAY) ×3 IMPLANT
PADDING CAST ABS 4INX4YD NS (CAST SUPPLIES) ×4
PADDING CAST ABS COTTON 4X4 ST (CAST SUPPLIES) IMPLANT
RUBBERBAND STERILE (MISCELLANEOUS) IMPLANT
SLING ARM FOAM STRAP LRG (SOFTGOODS) ×3 IMPLANT
SLING ARM LRG ADULT FOAM STRAP (SOFTGOODS) IMPLANT
SLING ARM MED ADULT FOAM STRAP (SOFTGOODS) IMPLANT
SLING ARM XL FOAM STRAP (SOFTGOODS) IMPLANT
STOCKINETTE 6  STRL (DRAPES) ×2
STOCKINETTE 6 STRL (DRAPES) ×1 IMPLANT
STRIP CLOSURE SKIN 1/2X4 (GAUZE/BANDAGES/DRESSINGS) ×1 IMPLANT
SUT ETHILON 8 0 BV130 4 (SUTURE) IMPLANT
SUT VIC AB 0 SH 27 (SUTURE) IMPLANT
SUT VIC AB 2-0 SH 27 (SUTURE)
SUT VIC AB 2-0 SH 27XBRD (SUTURE) IMPLANT
SUT VIC AB 3-0 SH 27 (SUTURE) ×3
SUT VIC AB 3-0 SH 27X BRD (SUTURE) ×1 IMPLANT
SUT VICRYL RAPIDE 4-0 (SUTURE) IMPLANT
SUT VICRYL RAPIDE 4/0 PS 2 (SUTURE) ×2 IMPLANT
SYR BULB 3OZ (MISCELLANEOUS) ×3 IMPLANT
SYRINGE 10CC LL (SYRINGE) ×2 IMPLANT
TOWEL OR 17X24 6PK STRL BLUE (TOWEL DISPOSABLE) ×3 IMPLANT
TOWEL OR NON WOVEN STRL DISP B (DISPOSABLE) ×1 IMPLANT
UNDERPAD 30X30 INCONTINENT (UNDERPADS AND DIAPERS) ×3 IMPLANT

## 2014-03-27 NOTE — Discharge Instructions (Signed)
Discharge Instructions ° ° °You have a light dressing on your hand.  °You may begin gentle motion of your fingers and hand immediately, but you should not do any heavy lifting or gripping.  Elevate your hand to reduce pain & swelling of the digits.  Ice over the operative site may be helpful to reduce pain & swelling.  DO NOT USE HEAT. °Pain medicine has been prescribed for you.  °Use your medicine as needed over the first 48 hours, and then you can begin to taper your use. You may use Tylenol in place of your prescribed pain medication, but not IN ADDITION to it. °Leave the dressing in place until the third day after your surgery and then remove it, leaving it open to air.  °After the bandage has been removed you may shower, regularly washing the incision and letting the water run over it, but not submerging it (no swimming, soaking it in dishwater, etc.) °You may drive a car when you are off of prescription pain medications and can safely control your vehicle with both hands. °We will address whether therapy will be required or not when you return to the office. °You may have already made your follow-up appointment when we completed your preop visit.  If not, please call our office today or the next business day to make your return appointment for 10-15 days after surgery. ° ° °Please call [phone] during normal business hours or [phone] after hours for any problems. Including the following: ° °- excessive redness of the incisions °- drainage for more than 4 days °- fever of more than 101.5 F ° °*Please note that pain medications will not be refilled after hours or on weekends. ° ° °Post Anesthesia Home Care Instructions ° °Activity: °Get plenty of rest for the remainder of the day. A responsible adult should stay with you for 24 hours following the procedure.  °For the next 24 hours, DO NOT: °-Drive a car °-Operate machinery °-Drink alcoholic beverages °-Take any medication unless instructed by your  physician °-Make any legal decisions or sign important papers. ° °Meals: °Start with liquid foods such as gelatin or soup. Progress to regular foods as tolerated. Avoid greasy, spicy, heavy foods. If nausea and/or vomiting occur, drink only clear liquids until the nausea and/or vomiting subsides. Call your physician if vomiting continues. ° °Special Instructions/Symptoms: °Your throat may feel dry or sore from the anesthesia or the breathing tube placed in your throat during surgery. If this causes discomfort, gargle with warm salt water. The discomfort should disappear within 24 hours. ° ° °

## 2014-03-27 NOTE — Transfer of Care (Signed)
Immediate Anesthesia Transfer of Care Note  Patient: [name]  Procedure(s) Performed: Procedure(s): RIGHT ULNAR NEUROPLASTY AT THE ELBOW (Right)  Patient Location: PACU  Anesthesia Type:General  Level of Consciousness: awake, alert , oriented and patient cooperative  Airway & Oxygen Therapy: Patient Spontanous Breathing and Patient connected to face mask oxygen  Post-op Assessment: Report given to RN and Post -op Vital signs reviewed and stable  Post vital signs: Reviewed and stable  Last Vitals:  Filed Vitals:   03/27/14 0857  BP: 125/75  Pulse: 77  Temp: 36.8 C  Resp: 20    Complications: No apparent anesthesia complications

## 2014-03-27 NOTE — Op Note (Addendum)
03/27/2014  10:07 AM  PATIENT:  [name]  54 y.o. female  PRE-OPERATIVE DIAGNOSIS:  Left ulnar neuropathy  POST-OPERATIVE DIAGNOSIS:  Same  PROCEDURE:  Left ulnar neuroplasty at the elbow  SURGEON: Cliffton Astersavid A. Janee Mornhompson, MD  PHYSICIAN ASSISTANT: Danielle RankinKirsten Schrader, OPA-C  ANESTHESIA:  general  SPECIMENS:  None  DRAINS:   None  EBL:  less than 50 mL  PREOPERATIVE INDICATIONS:  [name] is a  54 y.o. female with signs and symptoms of left ulnar neuropathy, localized to the region of the elbow. This was confirmed electrodiagnostic Nedra HaiLee and fail to resolve nonoperatively.  The risks benefits and alternatives were discussed with the patient preoperatively including but not limited to the risks of infection, bleeding, nerve injury, cardiopulmonary complications, the need for revision surgery, among others, and the patient verbalized understanding and consented to proceed.  OPERATIVE IMPLANTS: None  OPERATIVE PROCEDURE:  After receiving prophylactic antibiotics, the patient was escorted to the operative theatre and placed in a supine position.  General anesthesia was administered. A surgical "time-out" was performed during which the planned procedure, proposed operative site, and the correct patient identity were compared to the operative consent and agreement confirmed by the circulating nurse according to current facility policy.  The exposed skin was prepped with Chloraprep and draped in the usual sterile fashion. A sterile tourniquet was applied. The limb was exsanguinated with an Esmarch bandage and the tourniquet inflated to approximately 100mmHg higher than systolic BP. Incision was marked and anesthetized by instilling Marcaine with epinephrine.  A curvilinear incision was marked and made over the ulnar nerve at the elbow. Skin was incised sharply with scalpel, subcutaneous taste tissues were dissected with blunt spreading dissection with care to preserve  crossing cutaneous nerves. The nerve was identified just proximal to the right condylar groove and dissected free at that level. Elevating the skin and subcutaneous taste tissues with full-thickness flaps, the nerve was meticulously decompressed distally to include incising the superficial fascia the FCU. The deep fascia was not well-developed and was not impinging upon the nerve. The nerve was then freed in the same manner proximally, and a separate auxiliary incision was made over the course of the nerve in the mid brachium to allow access for decompression that level as the nerve traversed from the posterior to the anterior compartment.  The nerve diameter was larger just proximal to the hospice ligament, and more narrowed at that level. The elbow was cycled from flexion to extension several times in the nerve remained stable in its groove without dislocation.  Wounds were copiously irrigated, and the tourniquet released. The mid brachium incision was anesthetized with Marcaine with epinephrine. Additional hemostasis was obtained with bipolar electrocautery and the wounds were closed with 3-0 Vicryl deep dermal buried sutures and a running 4-0 Vicryl horizontal mattress suture and the skin. A bulky dressing without plaster was applied and she was awakened and taken to room stable condition, breathing spontaneously  DISPOSITION: Discharged home with typical instructions returning in 10-15 days.

## 2014-03-27 NOTE — Anesthesia Procedure Notes (Signed)
Procedure Name: LMA Insertion Date/Time: 03/27/2014 10:17 AM Performed by: Kaidence Callaway Pre-anesthesia Checklist: Patient identified, Emergency Drugs available, Suction available and Patient being monitored Patient Re-evaluated:Patient Re-evaluated prior to inductionOxygen Delivery Method: Circle System Utilized Preoxygenation: Pre-oxygenation with 100% oxygen Intubation Type: IV induction Ventilation: Mask ventilation without difficulty LMA: LMA inserted LMA Size: 4.0 Number of attempts: 1 Airway Equipment and Method: Bite block Placement Confirmation: positive ETCO2 Tube secured with: Tape Dental Injury: Teeth and Oropharynx as per pre-operative assessment

## 2014-03-27 NOTE — Interval H&P Note (Signed)
History and Physical Interval Note:  03/27/2014 10:06 AM  [name]  has presented today for surgery, with the diagnosis of right ulnar neuropathy  The various methods of treatment have been discussed with the patient and family. After consideration of risks, benefits and other options for treatment, the patient has consented to  Procedure(s): RIGHT ULNAR NEUROPLASTY AT THE ELBOW (Right) as a surgical intervention .  The patient's history has been reviewed, patient examined, no change in status, stable for surgery.  I have reviewed the patient's chart and labs.  Questions were answered to the patient's satisfaction.     Niel Peretti A.

## 2014-03-27 NOTE — Anesthesia Preprocedure Evaluation (Addendum)
Anesthesia Evaluation  Patient identified by MRN, date of birth, ID band Patient awake    Reviewed: Allergy & Precautions, NPO status , Patient's Chart, lab work & pertinent test results  Airway Mallampati: II   Neck ROM: full    Dental   Pulmonary asthma , sleep apnea and Continuous Positive Airway Pressure Ventilation , COPDformer smoker,          Cardiovascular negative cardio ROS      Neuro/Psych PSYCHIATRIC DISORDERS Anxiety Depression  Neuromuscular disease    GI/Hepatic GERD-  Medicated,  Endo/Other  diabetes, Type 2, Oral Hypoglycemic AgentsHypothyroidism   Renal/GU      Musculoskeletal  (+) Arthritis -, Fibromyalgia -  Abdominal   Peds  Hematology   Anesthesia Other Findings   Reproductive/Obstetrics                            Anesthesia Physical Anesthesia Plan  ASA: II  Anesthesia Plan: General   Post-op Pain Management:    Induction: Intravenous  Airway Management Planned: LMA  Additional Equipment:   Intra-op Plan:   Post-operative Plan:   Informed Consent: I have reviewed the patients History and Physical, chart, labs and discussed the procedure including the risks, benefits and alternatives for the proposed anesthesia with the patient or authorized representative who has indicated his/her understanding and acceptance.     Plan Discussed with: CRNA, Anesthesiologist and Surgeon  Anesthesia Plan Comments:         Anesthesia Quick Evaluation

## 2014-03-27 NOTE — Anesthesia Postprocedure Evaluation (Signed)
Anesthesia Post Note  Patient: [name]  Procedure(s) Performed: Procedure(s) (LRB): RIGHT ULNAR NEUROPLASTY AT THE ELBOW (Right)  Anesthesia type: General  Patient location: PACU  Post pain: Pain level controlled and Adequate analgesia  Post assessment: Post-op Vital signs reviewed, Patient's Cardiovascular Status Stable, Respiratory Function Stable, Patent Airway and Pain level controlled  Last Vitals:  Filed Vitals:   03/27/14 1145  BP: 110/56  Pulse: 78  Temp:   Resp: 15    Post vital signs: Reviewed and stable  Level of consciousness: awake, alert  and oriented  Complications: No apparent anesthesia complications

## 2014-03-28 ENCOUNTER — Encounter (HOSPITAL_BASED_OUTPATIENT_CLINIC_OR_DEPARTMENT_OTHER): Payer: Self-pay | Admitting: Orthopedic Surgery

## 2014-05-25 IMAGING — CR DG LUMBAR SPINE COMPLETE 4+V
5 series · 5 of 5 positions shown · non-contrast
Comparison: Lumbar spine MRI 12/26/2006

CLINICAL DATA: Fall, back pain, left hip pain.

LUMBAR SPINE - COMPLETE 4+ VIEW

[t l-spine a.p.]
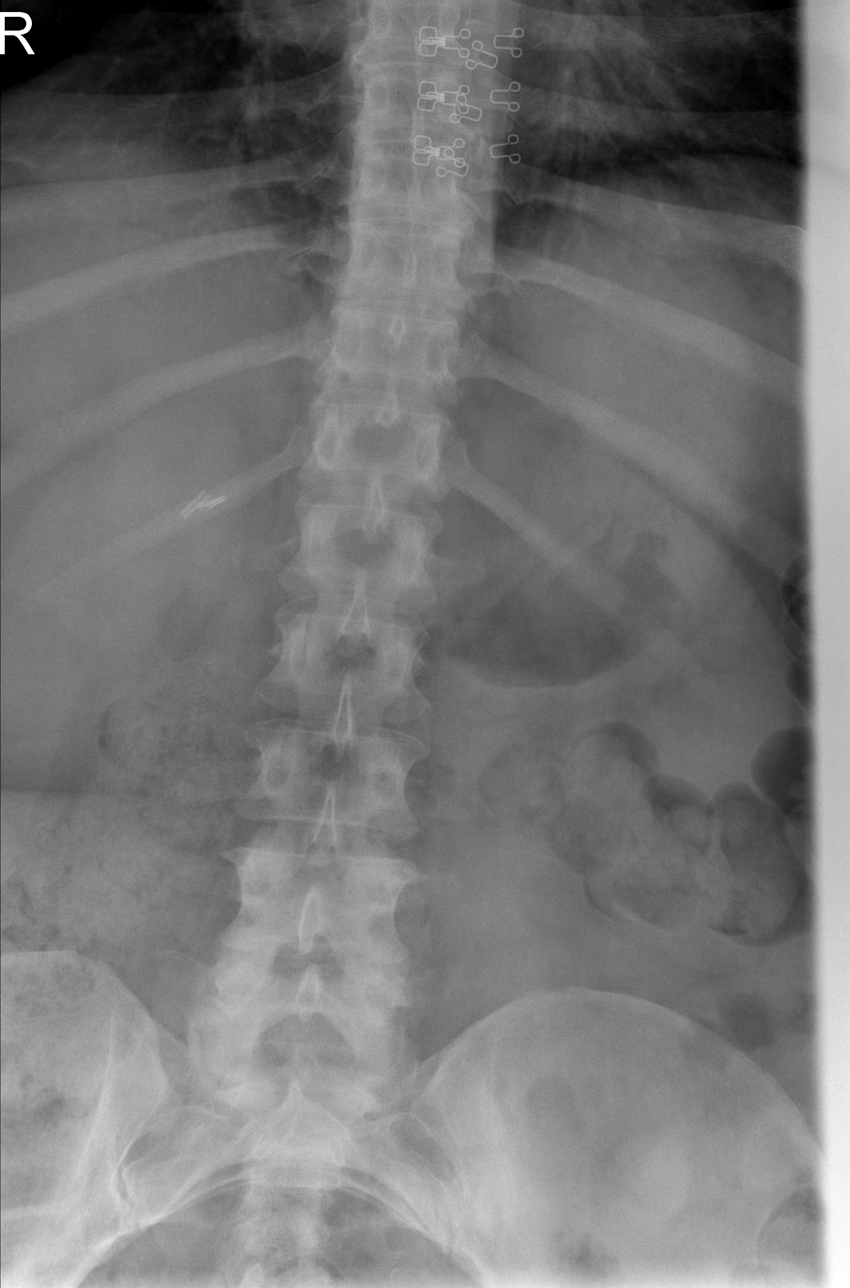

[t l-spine oblique exposure (1 of 2)]
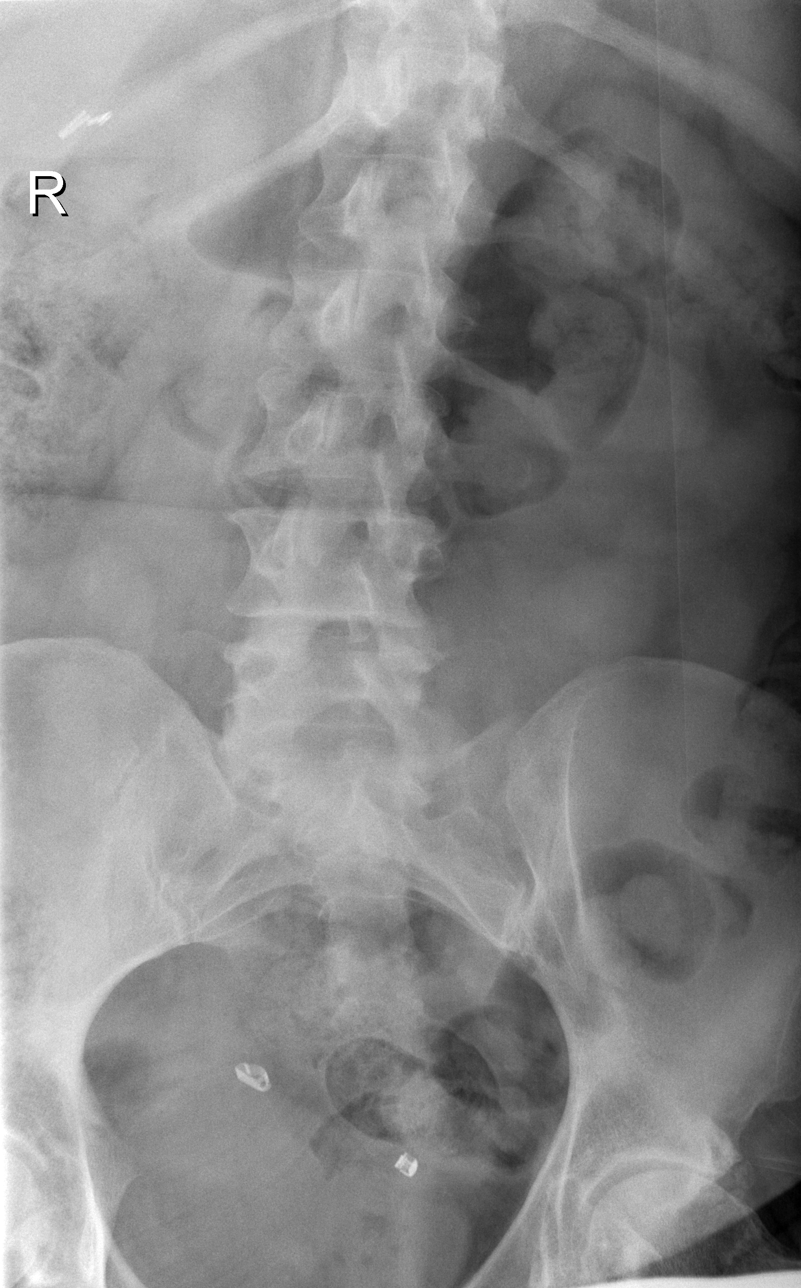

[t l-spine oblique exposure (2 of 2)]
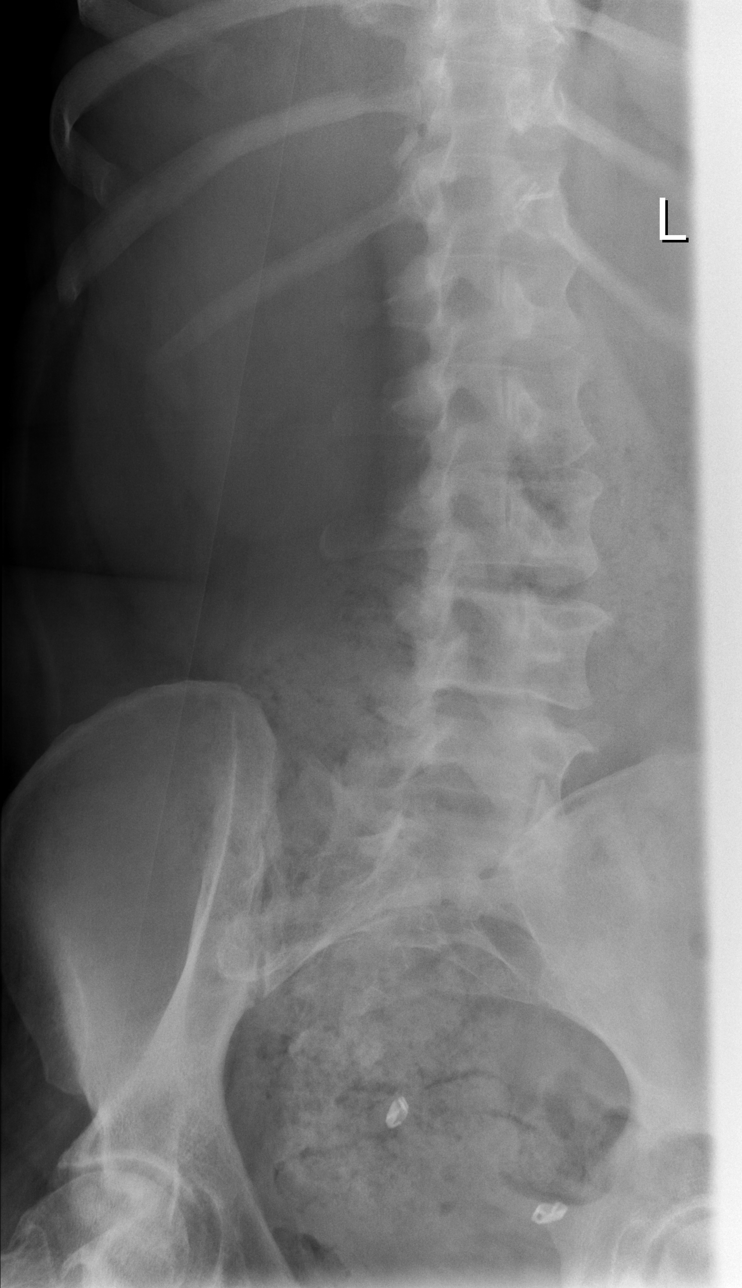

[t l-spine lat]
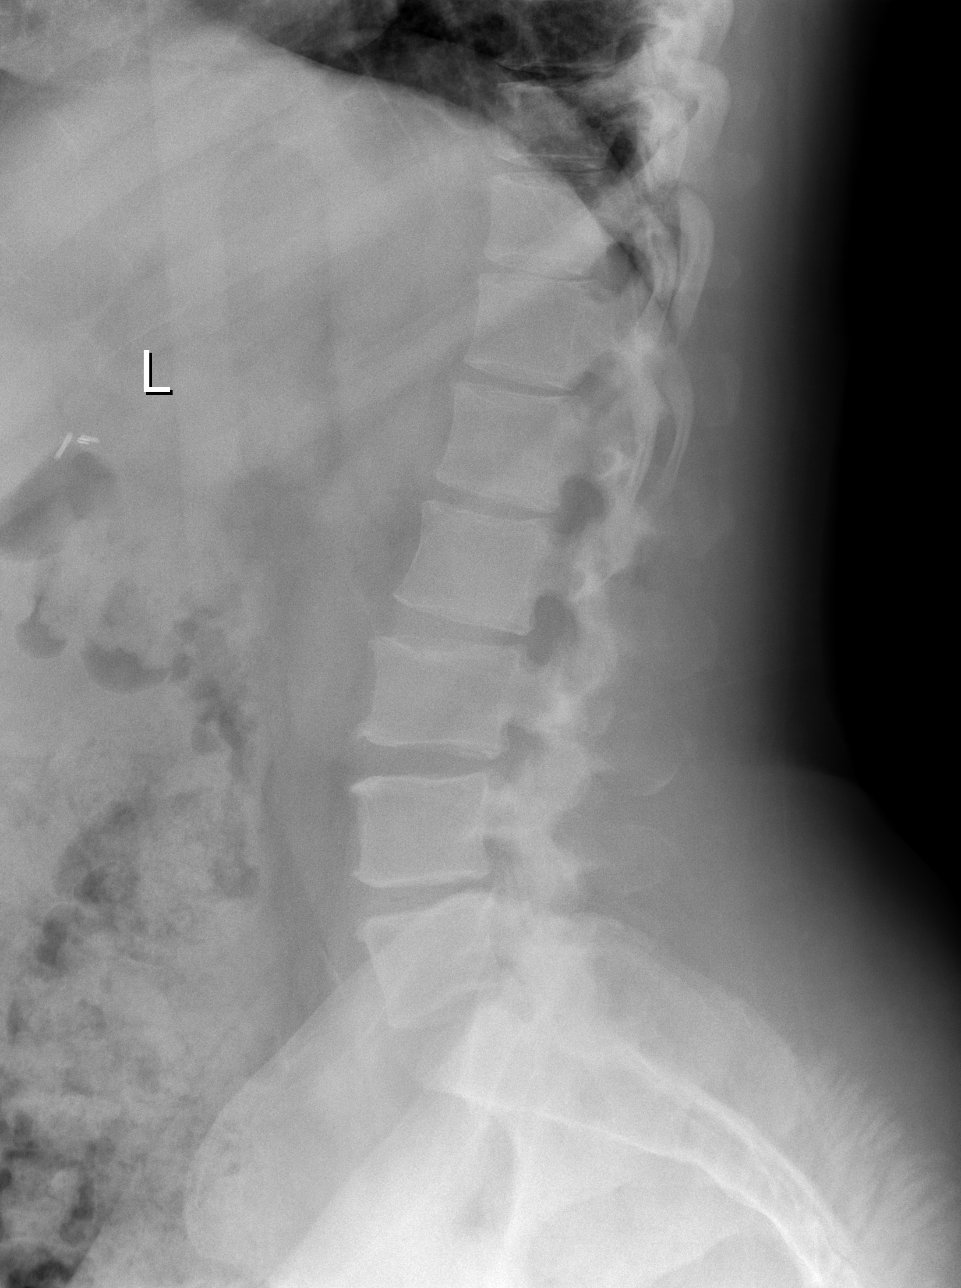

[t l-spine l5-s1 spot]
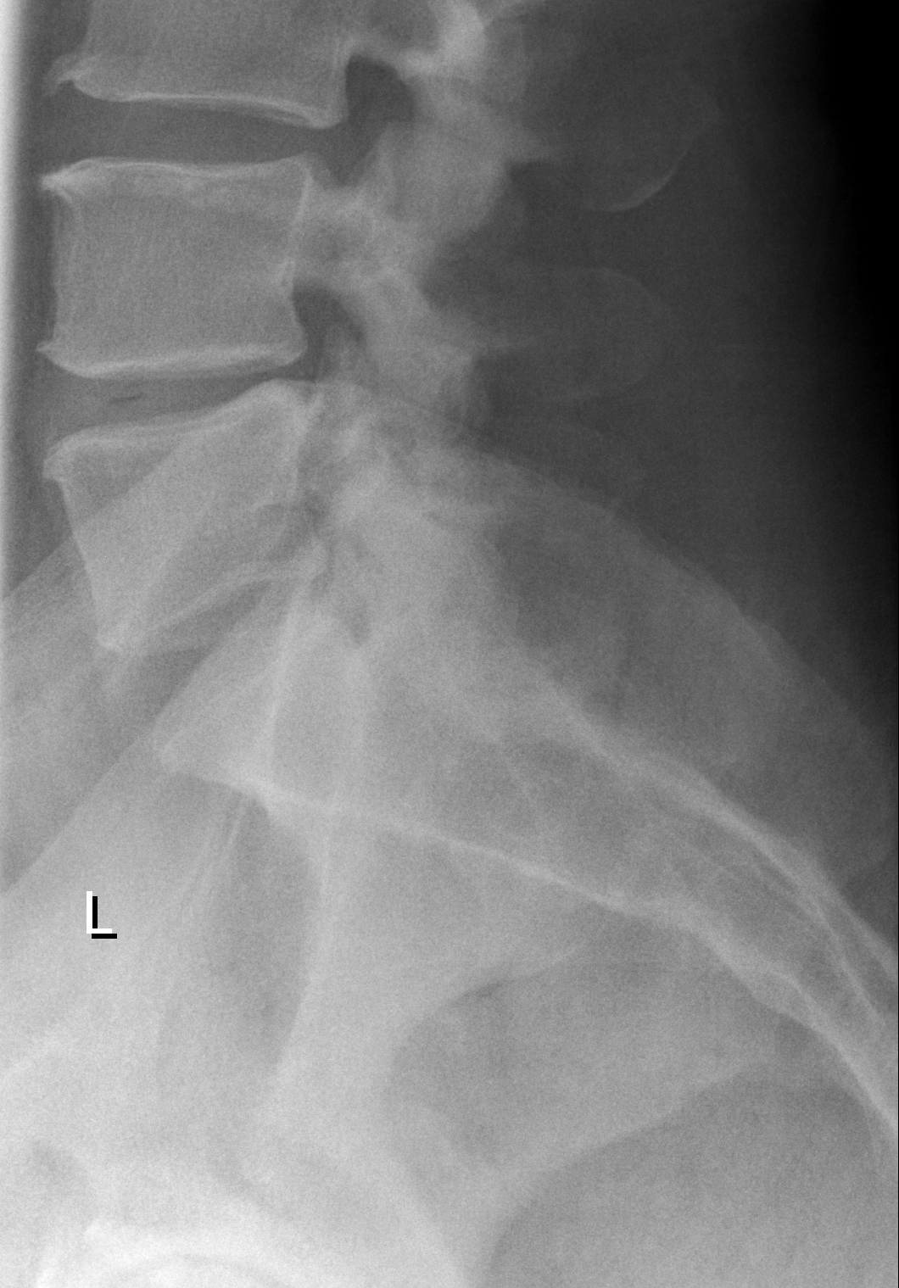

[5 of 5 positions shown; findings below may reference images not displayed]

FINDINGS: Mild degenerative facet disease in the mid and lower
lumbar spine.  Early degenerative disc disease changes in the lower
lumbar spine.  Minimal spurring anteriorly.  No fracture.  No
malalignment.  SI joints are symmetric and unremarkable.
IMPRESSION: Mild degenerative changes as above.  No acute findings.

## 2014-06-27 ENCOUNTER — Other Ambulatory Visit: Payer: Self-pay | Admitting: Orthopedic Surgery

## 2014-06-29 NOTE — Pre-Procedure Instructions (Addendum)
    [name]  06/29/2014     Your procedure is scheduled on Monday, June 13.  [facility]30 [facility].M.  Call this number if you have problems the morning of surgery:  [phone]                For any other questions, please call [phone], Monday - Friday 8 AM - 4 PM.     Remember:  Do not eat food or drink liquids after midnight Sunday, June 12.  Take these medicines the morning of surgery with A SIP OF WATER: escitalopram (LEXAPRO), levothyroxine (SYNTHROID, LEVOTHROID), pregabalin (LYRICA), morphine (MS CONTIN).  May use Fluticasone Furoate-Vilanterol.               Take if needed:clonazePAM [facility](KLONOPIN).                Stop taking Aspirin, Coumadin, Plavix, Effient and Herbal medications.  Do not take any NSAIDs ie: Ibuprofen,  Advil,Naproxen or any medication containing Aspirin.                Do not wear jewelry, make-up or nail polish.  Do not wear lotions, powders, or perfumes.                Do not shave 48 hours prior to surgery.  Do not bring valuables to the hospital.  [facility] is not responsible for any belongings or valuables.   Contacts, dentures or bridgework may not be worn into surgery.  Leave your suitcase in the car.  After surgery it may be brought to your room.  For patients admitted to the hospital, discharge time will be determined by your treatment team.  Patients discharged the day of surgery will not be allowed to drive home.    Please read over the following fact sheets that you were given. Pain Booklet, Coughing and Deep Breathing and Surgical Site Infection Prevention, How to Manage Your Diabetes Before and After Surgery and What Do I Do About My Diabetes Medications?

## 2014-06-30 ENCOUNTER — Ambulatory Visit (HOSPITAL_COMMUNITY): Admission: RE | Admit: 2014-06-30 | Payer: Medicaid Other | Source: Ambulatory Visit

## 2014-06-30 ENCOUNTER — Encounter (HOSPITAL_COMMUNITY): Payer: Self-pay

## 2014-06-30 ENCOUNTER — Encounter (HOSPITAL_COMMUNITY)
Admission: RE | Admit: 2014-06-30 | Discharge: 2014-06-30 | Disposition: A | Discharge status: Home / Self Care | Payer: Medicaid Other | Source: Ambulatory Visit | Attending: Orthopedic Surgery | Admitting: Orthopedic Surgery

## 2014-06-30 DIAGNOSIS — Z01818 Encounter for other preprocedural examination: Secondary | ICD-10-CM

## 2014-06-30 HISTORY — DX: Pneumonia, unspecified organism: J18.9

## 2014-06-30 LAB — CBC WITH DIFFERENTIAL/PLATELET
Basophils Absolute: 0 10*3/uL (ref 0.0–0.1)
Basophils Relative: 1 % (ref 0–1)
Eosinophils Absolute: 0.2 10*3/uL (ref 0.0–0.7)
Eosinophils Relative: 3 % (ref 0–5)
HCT: 39.1 % (ref 36.0–46.0)
Hemoglobin: 12.5 g/dL (ref 12.0–15.0)
LYMPHS PCT: 32 % (ref 12–46)
Lymphs Abs: 2.2 10*3/uL (ref 0.7–4.0)
MCH: 27.8 pg (ref 26.0–34.0)
MCHC: 32 g/dL (ref 30.0–36.0)
MCV: 86.9 fL (ref 78.0–100.0)
Monocytes Absolute: 0.6 10*3/uL (ref 0.1–1.0)
Monocytes Relative: 9 % (ref 3–12)
NEUTROS ABS: 3.8 10*3/uL (ref 1.7–7.7)
NEUTROS PCT: 55 % (ref 43–77)
Platelets: 242 10*3/uL (ref 150–400)
RBC: 4.5 MIL/uL (ref 3.87–5.11)
RDW: 13.7 % (ref 11.5–15.5)
WBC: 6.8 10*3/uL (ref 4.0–10.5)

## 2014-06-30 LAB — COMPREHENSIVE METABOLIC PANEL
ALT: 30 U/L (ref 14–54)
AST: 36 U/L (ref 15–41)
Albumin: 3.8 g/dL (ref 3.5–5.0)
Alkaline Phosphatase: 71 U/L (ref 38–126)
Anion gap: 11 (ref 5–15)
BILIRUBIN TOTAL: 0.1 mg/dL — AB (ref 0.3–1.2)
BUN: 8 mg/dL (ref 6–20)
CHLORIDE: 102 mmol/L (ref 101–111)
CO2: 26 mmol/L (ref 22–32)
Calcium: 9.3 mg/dL (ref 8.9–10.3)
Creatinine, Ser: 0.76 mg/dL (ref 0.44–1.00)
GFR calc Af Amer: >60 mL/min (ref >60)
GFR calc non Af Amer: >60 mL/min (ref >60)
GLUCOSE: 203 mg/dL — AB (ref 65–99)
Potassium: 3.9 mmol/L (ref 3.5–5.1)
Sodium: 139 mmol/L (ref 135–145)
TOTAL PROTEIN: 7.4 g/dL (ref 6.5–8.1)

## 2014-06-30 LAB — SURGICAL PCR SCREEN
MRSA, PCR: NEGATIVE
Staphylococcus aureus: NEGATIVE

## 2014-06-30 LAB — URINALYSIS, ROUTINE W REFLEX MICROSCOPIC
BILIRUBIN URINE: NEGATIVE
GLUCOSE, UA: NEGATIVE mg/dL
HGB URINE DIPSTICK: NEGATIVE
Ketones, ur: NEGATIVE mg/dL
Nitrite: NEGATIVE
Protein, ur: NEGATIVE mg/dL
SPECIFIC GRAVITY, URINE: 1.026 (ref 1.005–1.030)
UROBILINOGEN UA: 0.2 mg/dL (ref 0.0–1.0)
pH: 5 (ref 5.0–8.0)

## 2014-06-30 LAB — URINE MICROSCOPIC-ADD ON

## 2014-06-30 LAB — PROTIME-INR
INR: 1.01 (ref 0.00–1.49)
Prothrombin Time: 13.5 seconds (ref 11.6–15.2)

## 2014-06-30 LAB — TYPE AND SCREEN
ABO/RH(D): O NEG
ANTIBODY SCREEN: NEGATIVE

## 2014-06-30 LAB — GLUCOSE, CAPILLARY: Glucose-Capillary: 218 mg/dL — ABNORMAL HIGH (ref 65–99)

## 2014-06-30 LAB — APTT: aPTT: 29 seconds (ref 24–37)

## 2014-06-30 LAB — ABO/RH: ABO/RH(D): O NEG

## 2014-06-30 NOTE — Progress Notes (Addendum)
Ms Lori MillingMorales- is a type II diabetic, does not check CBGs, but said she will start checking it prior to surgery to surgery."  I know how important it is for it to be normal."  Last A1C was 6.4 [facility]. Patients PCP is Dr Mauricio Poegina York and she manages DM, I request labs and last office note. Patient [place] Dr Blenda [place], she said she had a chest -xray within the past year there.  I have faxed a request for records. Patient denies chest pain or shortness of breath, states she does not use nebulizer or rescue inhaler.

## 2014-06-30 NOTE — Pre-Procedure Instructions (Signed)
    [name]  06/30/2014     Your procedure is scheduled on Monday, June 13.  [facility] 8:30 A.M.  Call this number if you have problems the morning of surgery:  [phone]                For any other questions, please call [phone], Monday - Friday 8 AM - 4 PM.     Remember:  Do not eat food or drink liquids after midnight Sunday, June 12.  Take these medicines the morning of surgery with A SIP OF WATER: escitalopram (LEXAPRO), levothyroxine (SYNTHROID, LEVOTHROID), pregabalin (LYRICA), morphine (MS CONTIN).  May use Fluticasone Furoate-Vilanterol.               Take if needed:clonazePAM [facility](KLONOPIN).                Stop taking Aspirin, Coumadin, Plavix, Effient and Herbal medications.  Do not take any NSAIDs ie: Ibuprofen,  Advil,Naproxen or any medication containing Aspirin.                Do not wear jewelry, make-up or nail polish.  Do not wear lotions, powders, or perfumes.                Do not shave 48 hours prior to surgery.  Do not bring valuables to the hospital.  [facility] is not responsible for any belongings or valuables.   Contacts, dentures or bridgework may not be worn into surgery.  Leave your suitcase in the car.  After surgery it may be brought to your room.  For patients admitted to the hospital, discharge time will be determined by your treatment team.  Patients discharged the day of surgery will not be allowed to drive home.    Please read over the following fact sheets that you were given. Pain Booklet, Coughing and Deep Breathing and Surgical Site Infection Prevention, How to Manage Your Diabetes Before and After Surgery and What Do I Do About My Diabetes Medications?

## 2014-07-01 LAB — HEMOGLOBIN A1C
HEMOGLOBIN A1C: 7.3 % — AB (ref 4.8–5.6)
Mean Plasma Glucose: 163 mg/dL

## 2014-07-10 ENCOUNTER — Encounter (HOSPITAL_COMMUNITY)
Admission: RE | Disposition: A | Discharge status: Home Health | Payer: Self-pay | Source: Ambulatory Visit | Attending: Orthopedic Surgery

## 2014-07-10 ENCOUNTER — Inpatient Hospital Stay (HOSPITAL_COMMUNITY)
Admission: RE | Admit: 2014-07-10 | Discharge: 2014-07-12 | DRG: 470 | Disposition: A | Discharge status: Home Health | Payer: Medicaid Other | Source: Ambulatory Visit | Attending: Orthopedic Surgery | Admitting: Orthopedic Surgery

## 2014-07-10 ENCOUNTER — Encounter (HOSPITAL_COMMUNITY): Payer: Self-pay | Admitting: *Deleted

## 2014-07-10 ENCOUNTER — Inpatient Hospital Stay (HOSPITAL_COMMUNITY): Payer: Medicaid Other | Admitting: Certified Registered Nurse Anesthetist

## 2014-07-10 ENCOUNTER — Inpatient Hospital Stay (HOSPITAL_COMMUNITY): Payer: Medicaid Other

## 2014-07-10 DIAGNOSIS — E039 Hypothyroidism, unspecified: Secondary | ICD-10-CM | POA: Diagnosis present

## 2014-07-10 DIAGNOSIS — Z87891 Personal history of nicotine dependence: Secondary | ICD-10-CM

## 2014-07-10 DIAGNOSIS — Z79891 Long term (current) use of opiate analgesic: Secondary | ICD-10-CM | POA: Diagnosis not present

## 2014-07-10 DIAGNOSIS — F419 Anxiety disorder, unspecified: Secondary | ICD-10-CM | POA: Diagnosis present

## 2014-07-10 DIAGNOSIS — E119 Type 2 diabetes mellitus without complications: Secondary | ICD-10-CM | POA: Diagnosis present

## 2014-07-10 DIAGNOSIS — G4730 Sleep apnea, unspecified: Secondary | ICD-10-CM | POA: Diagnosis present

## 2014-07-10 DIAGNOSIS — M25552 Pain in left hip: Secondary | ICD-10-CM | POA: Diagnosis present

## 2014-07-10 DIAGNOSIS — M1612 Unilateral primary osteoarthritis, left hip: Principal | ICD-10-CM | POA: Diagnosis present

## 2014-07-10 DIAGNOSIS — G8929 Other chronic pain: Secondary | ICD-10-CM | POA: Diagnosis present

## 2014-07-10 DIAGNOSIS — Z419 Encounter for procedure for purposes other than remedying health state, unspecified: Secondary | ICD-10-CM

## 2014-07-10 DIAGNOSIS — Z91040 Latex allergy status: Secondary | ICD-10-CM | POA: Diagnosis not present

## 2014-07-10 DIAGNOSIS — J449 Chronic obstructive pulmonary disease, unspecified: Secondary | ICD-10-CM | POA: Diagnosis present

## 2014-07-10 DIAGNOSIS — M797 Fibromyalgia: Secondary | ICD-10-CM | POA: Diagnosis present

## 2014-07-10 DIAGNOSIS — F329 Major depressive disorder, single episode, unspecified: Secondary | ICD-10-CM | POA: Diagnosis present

## 2014-07-10 DIAGNOSIS — Z79899 Other long term (current) drug therapy: Secondary | ICD-10-CM | POA: Diagnosis not present

## 2014-07-10 DIAGNOSIS — K219 Gastro-esophageal reflux disease without esophagitis: Secondary | ICD-10-CM | POA: Diagnosis present

## 2014-07-10 HISTORY — PX: TOTAL HIP ARTHROPLASTY: SHX124

## 2014-07-10 HISTORY — DX: Unilateral primary osteoarthritis, left hip: M16.12

## 2014-07-10 LAB — GLUCOSE, CAPILLARY
GLUCOSE-CAPILLARY: 145 mg/dL — AB (ref 65–99)
Glucose-Capillary: 136 mg/dL — ABNORMAL HIGH (ref 65–99)
Glucose-Capillary: 204 mg/dL — ABNORMAL HIGH (ref 65–99)

## 2014-07-10 SURGERY — ARTHROPLASTY, HIP, TOTAL, ANTERIOR APPROACH
Anesthesia: Monitor Anesthesia Care | Site: Hip | Laterality: Left

## 2014-07-10 MED ORDER — ALUM & MAG HYDROXIDE-SIMETH 200-200-20 MG/5ML PO SUSP: 30.0000 mL | ORAL | Status: DC | PRN

## 2014-07-10 MED ORDER — OXYCODONE-ACETAMINOPHEN 5-325 MG PO TABS: 1.0000 | ORAL_TABLET | ORAL | Status: DC | PRN

## 2014-07-10 MED ORDER — PROPOFOL 10 MG/ML IV BOLUS
INTRAVENOUS | Status: AC
  Filled 2014-07-10: qty 20

## 2014-07-10 MED ORDER — HYDROMORPHONE HCL 1 MG/ML IJ SOLN
0.2500 mg | INTRAMUSCULAR | Status: DC | PRN
  Administered 2014-07-10 (×4): 0.5 mg via INTRAVENOUS

## 2014-07-10 MED ORDER — BUPIVACAINE LIPOSOME 1.3 % IJ SUSP
20.0000 mL | INTRAMUSCULAR | Status: DC
  Filled 2014-07-10: qty 20

## 2014-07-10 MED ORDER — METHOCARBAMOL 500 MG PO TABS
500.0000 mg | ORAL_TABLET | Freq: Four times a day (QID) | ORAL | Status: DC | PRN
  Administered 2014-07-10 – 2014-07-12 (×6): 500 mg via ORAL
  Filled 2014-07-10 (×7): qty 1

## 2014-07-10 MED ORDER — ONDANSETRON HCL 4 MG/2ML IJ SOLN
INTRAMUSCULAR | Status: AC
  Filled 2014-07-10: qty 2

## 2014-07-10 MED ORDER — ONDANSETRON HCL 4 MG PO TABS: 4.0000 mg | ORAL_TABLET | Freq: Four times a day (QID) | ORAL | Status: DC | PRN

## 2014-07-10 MED ORDER — ONDANSETRON HCL 4 MG/2ML IJ SOLN: 4.0000 mg | Freq: Four times a day (QID) | INTRAMUSCULAR | Status: DC | PRN

## 2014-07-10 MED ORDER — KETOROLAC TROMETHAMINE 15 MG/ML IJ SOLN
15.0000 mg | Freq: Four times a day (QID) | INTRAMUSCULAR | Status: AC
  Administered 2014-07-10 – 2014-07-11 (×4): 15 mg via INTRAVENOUS
  Filled 2014-07-10 (×3): qty 1

## 2014-07-10 MED ORDER — FENTANYL CITRATE (PF) 250 MCG/5ML IJ SOLN
INTRAMUSCULAR | Status: AC
  Filled 2014-07-10: qty 5

## 2014-07-10 MED ORDER — PROPOFOL INFUSION 10 MG/ML OPTIME
INTRAVENOUS | Status: DC | PRN
  Administered 2014-07-10: 50 ug/kg/min via INTRAVENOUS

## 2014-07-10 MED ORDER — PREGABALIN 100 MG PO CAPS
200.0000 mg | ORAL_CAPSULE | Freq: Three times a day (TID) | ORAL | Status: DC
  Administered 2014-07-10 – 2014-07-12 (×6): 200 mg via ORAL
  Filled 2014-07-10 (×7): qty 2

## 2014-07-10 MED ORDER — PROMETHAZINE HCL 25 MG/ML IJ SOLN: 12.5000 mg | Freq: Four times a day (QID) | INTRAMUSCULAR | Status: DC | PRN

## 2014-07-10 MED ORDER — MIDAZOLAM HCL 5 MG/5ML IJ SOLN: INTRAMUSCULAR | Status: DC | PRN

## 2014-07-10 MED ORDER — ASPIRIN EC 325 MG PO TBEC
325.0000 mg | DELAYED_RELEASE_TABLET | Freq: Two times a day (BID) | ORAL | Status: DC
  Administered 2014-07-10 – 2014-07-12 (×4): 325 mg via ORAL
  Filled 2014-07-10 (×3): qty 1

## 2014-07-10 MED ORDER — FENTANYL CITRATE (PF) 100 MCG/2ML IJ SOLN
INTRAMUSCULAR | Status: DC | PRN
  Administered 2014-07-10: 50 ug via INTRAVENOUS
  Administered 2014-07-10: 25 ug via INTRAVENOUS
  Administered 2014-07-10: 50 ug via INTRAVENOUS

## 2014-07-10 MED ORDER — PHENYLEPHRINE 40 MCG/ML (10ML) SYRINGE FOR IV PUSH (FOR BLOOD PRESSURE SUPPORT)
PREFILLED_SYRINGE | INTRAVENOUS | Status: AC
  Filled 2014-07-10: qty 20

## 2014-07-10 MED ORDER — BUPIVACAINE HCL 0.5 % IJ SOLN
INTRAMUSCULAR | Status: DC | PRN
  Administered 2014-07-10: 20 mL

## 2014-07-10 MED ORDER — LIDOCAINE HCL (CARDIAC) 20 MG/ML IV SOLN
INTRAVENOUS | Status: AC
  Filled 2014-07-10: qty 5

## 2014-07-10 MED ORDER — GLYBURIDE 5 MG PO TABS
5.0000 mg | ORAL_TABLET | Freq: Two times a day (BID) | ORAL | Status: DC
  Administered 2014-07-11 – 2014-07-12 (×4): 5 mg via ORAL
  Filled 2014-07-10 (×6): qty 1

## 2014-07-10 MED ORDER — 0.9 % SODIUM CHLORIDE (POUR BTL) OPTIME
TOPICAL | Status: DC | PRN
Start: 2014-07-10 — End: 2014-07-10
  Administered 2014-07-10: 1000 mL

## 2014-07-10 MED ORDER — CLONAZEPAM 1 MG PO TABS
1.0000 mg | ORAL_TABLET | Freq: Three times a day (TID) | ORAL | Status: DC | PRN
  Administered 2014-07-11 (×2): 1 mg via ORAL
  Filled 2014-07-10 (×2): qty 1

## 2014-07-10 MED ORDER — POLYETHYLENE GLYCOL 3350 17 G PO PACK: 17.0000 g | PACK | Freq: Every day | ORAL | Status: DC | PRN

## 2014-07-10 MED ORDER — SUCCINYLCHOLINE CHLORIDE 20 MG/ML IJ SOLN
INTRAMUSCULAR | Status: AC
  Filled 2014-07-10: qty 1

## 2014-07-10 MED ORDER — KETOROLAC TROMETHAMINE 15 MG/ML IJ SOLN
INTRAMUSCULAR | Status: AC
  Filled 2014-07-10: qty 1

## 2014-07-10 MED ORDER — ASPIRIN EC 325 MG PO TBEC: 325.0000 mg | DELAYED_RELEASE_TABLET | Freq: Two times a day (BID) | ORAL | Status: DC

## 2014-07-10 MED ORDER — OXYCODONE HCL 5 MG PO TABS
5.0000 mg | ORAL_TABLET | Freq: Once | ORAL | Status: AC | PRN
  Administered 2014-07-10: 5 mg via ORAL

## 2014-07-10 MED ORDER — MIDAZOLAM HCL 2 MG/2ML IJ SOLN
INTRAMUSCULAR | Status: AC
  Filled 2014-07-10: qty 2

## 2014-07-10 MED ORDER — METFORMIN HCL 500 MG PO TABS
500.0000 mg | ORAL_TABLET | Freq: Two times a day (BID) | ORAL | Status: DC
  Administered 2014-07-11 – 2014-07-12 (×4): 500 mg via ORAL
  Filled 2014-07-10 (×4): qty 1

## 2014-07-10 MED ORDER — SODIUM CHLORIDE 0.9 % IV SOLN: INTRAVENOUS | Status: DC

## 2014-07-10 MED ORDER — METHOCARBAMOL 1000 MG/10ML IJ SOLN
500.0000 mg | Freq: Four times a day (QID) | INTRAVENOUS | Status: DC | PRN
  Administered 2014-07-10: 500 mg via INTRAVENOUS
  Filled 2014-07-10 (×2): qty 5

## 2014-07-10 MED ORDER — FLEET ENEMA 7-19 GM/118ML RE ENEM: 1.0000 | ENEMA | Freq: Once | RECTAL | Status: AC | PRN

## 2014-07-10 MED ORDER — MORPHINE SULFATE ER 30 MG PO TBCR
30.0000 mg | EXTENDED_RELEASE_TABLET | Freq: Two times a day (BID) | ORAL | Status: DC
Start: 2014-07-10 — End: 2014-07-12
  Administered 2014-07-10 – 2014-07-12 (×4): 30 mg via ORAL
  Filled 2014-07-10 (×4): qty 1

## 2014-07-10 MED ORDER — DOCUSATE SODIUM 100 MG PO CAPS
100.0000 mg | ORAL_CAPSULE | Freq: Two times a day (BID) | ORAL | Status: DC
  Administered 2014-07-10 – 2014-07-12 (×4): 100 mg via ORAL
  Filled 2014-07-10 (×4): qty 1

## 2014-07-10 MED ORDER — ACETAMINOPHEN 325 MG PO TABS
650.0000 mg | ORAL_TABLET | Freq: Four times a day (QID) | ORAL | Status: DC | PRN
Start: 2014-07-10 — End: 2014-07-12
  Administered 2014-07-12 (×2): 650 mg via ORAL
  Filled 2014-07-10 (×2): qty 2

## 2014-07-10 MED ORDER — HYDROMORPHONE HCL 1 MG/ML IJ SOLN
1.0000 mg | INTRAMUSCULAR | Status: DC | PRN
  Administered 2014-07-11: 1 mg via INTRAVENOUS
  Filled 2014-07-10: qty 1

## 2014-07-10 MED ORDER — BISACODYL 10 MG RE SUPP: 10.0000 mg | Freq: Every day | RECTAL | Status: DC | PRN

## 2014-07-10 MED ORDER — INSULIN ASPART 100 UNIT/ML ~~LOC~~ SOLN
0.0000 [IU] | Freq: Three times a day (TID) | SUBCUTANEOUS | Status: DC
  Administered 2014-07-11: 3 [IU] via SUBCUTANEOUS
  Administered 2014-07-11 (×2): 2 [IU] via SUBCUTANEOUS
  Administered 2014-07-12 (×2): 3 [IU] via SUBCUTANEOUS
  Administered 2014-07-12: 5 [IU] via SUBCUTANEOUS

## 2014-07-10 MED ORDER — BUPIVACAINE IN DEXTROSE 0.75-8.25 % IT SOLN
INTRATHECAL | Status: DC | PRN
  Administered 2014-07-10: 1.6 mL via INTRATHECAL

## 2014-07-10 MED ORDER — CEFAZOLIN SODIUM-DEXTROSE 2-3 GM-% IV SOLR
2.0000 g | Freq: Four times a day (QID) | INTRAVENOUS | Status: AC
  Administered 2014-07-10: 2 g via INTRAVENOUS
  Filled 2014-07-10 (×2): qty 50

## 2014-07-10 MED ORDER — OXYCODONE-ACETAMINOPHEN 5-325 MG PO TABS
1.0000 | ORAL_TABLET | ORAL | Status: DC | PRN
  Administered 2014-07-10 – 2014-07-12 (×8): 2 via ORAL
  Filled 2014-07-10 (×8): qty 2

## 2014-07-10 MED ORDER — HYDROMORPHONE HCL 1 MG/ML IJ SOLN
INTRAMUSCULAR | Status: AC
  Filled 2014-07-10: qty 1

## 2014-07-10 MED ORDER — METHOCARBAMOL 750 MG PO TABS: 750.0000 mg | ORAL_TABLET | Freq: Three times a day (TID) | ORAL | Status: DC | PRN

## 2014-07-10 MED ORDER — LIRAGLUTIDE 18 MG/3ML ~~LOC~~ SOPN
1.8000 mg | PEN_INJECTOR | Freq: Every day | SUBCUTANEOUS | Status: DC
  Administered 2014-07-11 – 2014-07-12 (×2): 1.8 mg via SUBCUTANEOUS

## 2014-07-10 MED ORDER — CEFAZOLIN SODIUM-DEXTROSE 2-3 GM-% IV SOLR
2.0000 g | INTRAVENOUS | Status: AC
  Administered 2014-07-10: 2 g via INTRAVENOUS
  Filled 2014-07-10: qty 50

## 2014-07-10 MED ORDER — ALBUMIN HUMAN 5 % IV SOLN
INTRAVENOUS | Status: DC | PRN
  Administered 2014-07-10: 15:00:00 via INTRAVENOUS

## 2014-07-10 MED ORDER — DIPHENHYDRAMINE HCL 12.5 MG/5ML PO ELIX
12.5000 mg | ORAL_SOLUTION | ORAL | Status: DC | PRN
  Administered 2014-07-10 – 2014-07-12 (×4): 25 mg via ORAL
  Filled 2014-07-10 (×4): qty 10

## 2014-07-10 MED ORDER — TEMAZEPAM 15 MG PO CAPS
30.0000 mg | ORAL_CAPSULE | Freq: Every evening | ORAL | Status: DC | PRN
  Administered 2014-07-10: 30 mg via ORAL
  Filled 2014-07-10: qty 2

## 2014-07-10 MED ORDER — PHENYLEPHRINE HCL 10 MG/ML IJ SOLN
10.0000 mg | INTRAVENOUS | Status: DC | PRN
  Administered 2014-07-10: 20 ug/min via INTRAVENOUS

## 2014-07-10 MED ORDER — ACETAMINOPHEN 650 MG RE SUPP: 650.0000 mg | Freq: Four times a day (QID) | RECTAL | Status: DC | PRN

## 2014-07-10 MED ORDER — ESCITALOPRAM OXALATE 10 MG PO TABS
20.0000 mg | ORAL_TABLET | Freq: Every day | ORAL | Status: DC
  Administered 2014-07-11 – 2014-07-12 (×2): 20 mg via ORAL
  Filled 2014-07-10 (×2): qty 2

## 2014-07-10 MED ORDER — LACTATED RINGERS IV SOLN
INTRAVENOUS | Status: DC
  Administered 2014-07-10 (×2): via INTRAVENOUS

## 2014-07-10 MED ORDER — OXYCODONE HCL 5 MG/5ML PO SOLN: 5.0000 mg | Freq: Once | ORAL | Status: AC | PRN

## 2014-07-10 MED ORDER — CHLORHEXIDINE GLUCONATE 4 % EX LIQD: 60.0000 mL | Freq: Once | CUTANEOUS | Status: DC

## 2014-07-10 MED ORDER — BUPIVACAINE LIPOSOME 1.3 % IJ SUSP
INTRAMUSCULAR | Status: DC | PRN
Start: 2014-07-10 — End: 2014-07-10
  Administered 2014-07-10: 20 mL

## 2014-07-10 MED ORDER — TRANEXAMIC ACID 1000 MG/10ML IV SOLN
1000.0000 mg | INTRAVENOUS | Status: AC
  Administered 2014-07-10: 1000 mg via INTRAVENOUS
  Filled 2014-07-10: qty 10

## 2014-07-10 MED ORDER — FENTANYL CITRATE (PF) 100 MCG/2ML IJ SOLN: INTRAMUSCULAR | Status: DC | PRN

## 2014-07-10 MED ORDER — LEVOTHYROXINE SODIUM 50 MCG PO TABS
50.0000 ug | ORAL_TABLET | Freq: Every day | ORAL | Status: DC
  Administered 2014-07-11 – 2014-07-12 (×2): 50 ug via ORAL
  Filled 2014-07-10 (×2): qty 1

## 2014-07-10 MED ORDER — OXYCODONE HCL 5 MG PO TABS
ORAL_TABLET | ORAL | Status: AC
  Filled 2014-07-10: qty 1

## 2014-07-10 MED ORDER — MIDAZOLAM HCL 5 MG/5ML IJ SOLN
INTRAMUSCULAR | Status: DC | PRN
Start: 2014-07-10 — End: 2014-07-10
  Administered 2014-07-10 (×2): 1 mg via INTRAVENOUS

## 2014-07-10 SURGICAL SUPPLY — 59 items
APL SKNCLS STERI-STRIP NONHPOA (GAUZE/BANDAGES/DRESSINGS) ×1
BENZOIN TINCTURE PRP APPL 2/3 (GAUZE/BANDAGES/DRESSINGS) ×3 IMPLANT
BLADE SAW SGTL 18X1.27X75 (BLADE) ×2 IMPLANT
BLADE SAW SGTL 18X1.27X75MM (BLADE) ×1
BLADE SURG ROTATE 9660 (MISCELLANEOUS) IMPLANT
BNDG COHESIVE 6X5 TAN STRL LF (GAUZE/BANDAGES/DRESSINGS) IMPLANT
BNDG GAUZE ELAST 4 BULKY (GAUZE/BANDAGES/DRESSINGS) IMPLANT
CAPT HIP TOTAL 2 ×2 IMPLANT
CELLS DAT CNTRL 66122 CELL SVR (MISCELLANEOUS) ×1 IMPLANT
CLOSURE STERI-STRIP 1/2X4 (GAUZE/BANDAGES/DRESSINGS) ×1
CLSR STERI-STRIP ANTIMIC 1/2X4 (GAUZE/BANDAGES/DRESSINGS) ×1 IMPLANT
COVER SURGICAL LIGHT HANDLE (MISCELLANEOUS) ×3 IMPLANT
DRAPE C-ARM 42X72 X-RAY (DRAPES) ×3 IMPLANT
DRAPE IMP U-DRAPE 54X76 (DRAPES) ×3 IMPLANT
DRAPE STERI IOBAN 125X83 (DRAPES) ×3 IMPLANT
DRAPE U-SHAPE 47X51 STRL (DRAPES) ×9 IMPLANT
DRSG AQUACEL AG ADV 3.5X10 (GAUZE/BANDAGES/DRESSINGS) ×3 IMPLANT
DRSG MEPILEX BORDER 4X8 (GAUZE/BANDAGES/DRESSINGS) ×3 IMPLANT
DURAPREP 26ML APPLICATOR (WOUND CARE) ×3 IMPLANT
ELECT BLADE 4.0 EZ CLEAN MEGAD (MISCELLANEOUS)
ELECT CAUTERY BLADE 6.4 (BLADE) ×3 IMPLANT
ELECT REM PT RETURN 9FT ADLT (ELECTROSURGICAL) ×3
ELECTRODE BLDE 4.0 EZ CLN MEGD (MISCELLANEOUS) IMPLANT
ELECTRODE REM PT RTRN 9FT ADLT (ELECTROSURGICAL) ×1 IMPLANT
GAUZE XEROFORM 1X8 LF (GAUZE/BANDAGES/DRESSINGS) ×3 IMPLANT
GLOVE BIOGEL PI IND STRL 8 (GLOVE) ×2 IMPLANT
GLOVE BIOGEL PI INDICATOR 8 (GLOVE) ×4
GLOVE ECLIPSE 7.5 STRL STRAW (GLOVE) ×6 IMPLANT
GOWN STRL REUS W/ TWL LRG LVL3 (GOWN DISPOSABLE) ×2 IMPLANT
GOWN STRL REUS W/ TWL XL LVL3 (GOWN DISPOSABLE) ×2 IMPLANT
GOWN STRL REUS W/TWL LRG LVL3 (GOWN DISPOSABLE) ×6
GOWN STRL REUS W/TWL XL LVL3 (GOWN DISPOSABLE) ×6
HOOD PEEL AWAY FACE SHEILD DIS (HOOD) ×6 IMPLANT
KIT BASIN OR (CUSTOM PROCEDURE TRAY) ×3 IMPLANT
KIT ROOM TURNOVER OR (KITS) ×3 IMPLANT
MANIFOLD NEPTUNE II (INSTRUMENTS) ×3 IMPLANT
NDL SPNL 22GX3.5 QUINCKE BK (NEEDLE) ×1 IMPLANT
NEEDLE SPNL 22GX3.5 QUINCKE BK (NEEDLE) ×3 IMPLANT
NS IRRIG 1000ML POUR BTL (IV SOLUTION) ×3 IMPLANT
PACK TOTAL JOINT (CUSTOM PROCEDURE TRAY) ×3 IMPLANT
PACK UNIVERSAL I (CUSTOM PROCEDURE TRAY) ×3 IMPLANT
PAD ARMBOARD 7.5X6 YLW CONV (MISCELLANEOUS) ×6 IMPLANT
RETRACTOR WND ALEXIS 18 MED (MISCELLANEOUS) ×1 IMPLANT
RTRCTR WOUND ALEXIS 18CM MED (MISCELLANEOUS) ×3
SPONGE LAP 18X18 X RAY DECT (DISPOSABLE) IMPLANT
STAPLER VISISTAT 35W (STAPLE) IMPLANT
SUT ETHIBOND NAB CT1 #1 30IN (SUTURE) ×6 IMPLANT
SUT MNCRL AB 3-0 PS2 18 (SUTURE) IMPLANT
SUT VIC AB 0 CT1 27 (SUTURE) ×3
SUT VIC AB 0 CT1 27XBRD ANBCTR (SUTURE) ×1 IMPLANT
SUT VIC AB 1 CT1 27 (SUTURE) ×6
SUT VIC AB 1 CT1 27XBRD ANBCTR (SUTURE) ×2 IMPLANT
SUT VIC AB 2-0 CT1 27 (SUTURE) ×3
SUT VIC AB 2-0 CT1 TAPERPNT 27 (SUTURE) ×1 IMPLANT
SYR 50ML LL SCALE MARK (SYRINGE) ×3 IMPLANT
TOWEL OR 17X24 6PK STRL BLUE (TOWEL DISPOSABLE) ×3 IMPLANT
TOWEL OR 17X26 10 PK STRL BLUE (TOWEL DISPOSABLE) ×3 IMPLANT
TRAY FOLEY CATH 16FR SILVER (SET/KITS/TRAYS/PACK) IMPLANT
WATER STERILE IRR 1000ML POUR (IV SOLUTION) ×6 IMPLANT

## 2014-07-10 NOTE — Brief Op Note (Signed)
07/10/2014  2:46 PM  PATIENT:  [name]  54 y.o. female  PRE-OPERATIVE DIAGNOSIS:  DEGENERATIVE JOINT DISEASE LEFT HIP  POST-OPERATIVE DIAGNOSIS:  DEGENERATIVE JOINT DISEASE LEFT HIP  PROCEDURE:  Procedure(s): TOTAL HIP ARTHROPLASTY ANTERIOR APPROACH (Left)  SURGEON:  Surgeon(s) and Role:    * Jodi Geralds, MD - Primary  PHYSICIAN ASSISTANT:   ASSISTANTS: bethune    ANESTHESIA:   general  EBL:  Total I/O In: -  Out: 700 [Urine:250; Blood:450]  BLOOD ADMINISTERED:none  DRAINS: none   LOCAL MEDICATIONS USED:  OTHER experel  SPECIMEN:  No Specimen  DISPOSITION OF SPECIMEN:  N/A  COUNTS:  YES  TOURNIQUET:  * No tourniquets in log *  DICTATION: .Other Dictation: Dictation Number [id number]  PLAN OF CARE: Admit to inpatient   PATIENT DISPOSITION:  PACU - hemodynamically stable.   Delay start of Pharmacological VTE agent (>24hrs) due to surgical blood loss or risk of bleeding: no

## 2014-07-10 NOTE — H&P (Signed)
TOTAL HIP ADMISSION H&P  Patient is admitted for left total hip arthroplasty.  Subjective:  Chief Complaint: left hip pain  HPI: [name], 54 y.o. female, has a history of pain and functional disability in the left hip(s) due to arthritis and patient has failed non-surgical conservative treatments for greater than 12 weeks to include NSAID's and/or analgesics, supervised PT with diminished ADL's post treatment, weight reduction as appropriate and activity modification.  Onset of symptoms was gradual starting 8 years ago with gradually worsening course since that time.The patient noted no past surgery on the left hip(s).  Patient currently rates pain in the left hip at 8 out of 10 with activity. Patient has night pain, worsening of pain with activity and weight bearing, trendelenberg gait, pain with passive range of motion, crepitus and joint swelling. Patient has evidence of periarticular osteophytes, joint subluxation and joint space narrowing by imaging studies. This condition presents safety issues increasing the risk of falls. This patient has had failure of all reasonable conservative care.  There is no current active infection.  There are no active problems to display for this patient.  Past Medical History  Diagnosis Date  . DDD (degenerative disc disease), cervical   . DDD (degenerative disc disease), lumbar   . COPD (chronic obstructive pulmonary disease)   . Neuropathy   . Hypothyroid   . Asthma   . Chronic pain   . Depression   . Anxiety   . Shortness of breath     WITH EXERTION   . Sleep apnea     w/ CPAP  . Fibromyalgia   . IBS (irritable bowel syndrome)   . Full dentures   . Wears glasses   . Pneumonia   . Diabetes mellitus without complication     Type II  . GERD (gastroesophageal reflux disease)     not taking medications  . H. pylori infection     Past Surgical History  Procedure Laterality Date  . Back surgery  1997  . Abdominal  hysterectomy    . Tubal ligation    . Bladder surgery      with hysterectomy  . Rectal surgery      with hysterectomy  . Carpal tunnel release Bilateral   . Cholecystectomy    . Multiple extractions with alveoloplasty N/A 11/26/2012    Procedure: MULTIPLE EXTRACTION WITH ALVEOLOPLASTY;  Surgeon: Gae Bon, DDS;  Location: [facility];  Service: Oral Surgery;  Laterality: N/A;  . Ulnar nerve transposition Right 03/27/2014    Procedure: RIGHT ULNAR NEUROPLASTY AT THE ELBOW;  Surgeon: Jolyn Nap, MD;  Location: [facility];  Service: Orthopedics;  Laterality: Right;  . Colonoscopy w/ polypectomy    . Upper gastrointestinal endoscopy      Prescriptions prior to admission  Medication Sig Dispense Refill Last Dose  . clonazePAM (KLONOPIN) 1 MG tablet Take 1 mg by mouth 3 (three) times daily as needed for anxiety.   Past Month at Unknown time  . cyclobenzaprine (FLEXERIL) 10 MG tablet Take 10 mg by mouth daily as needed for muscle spasms.    07/09/2014 at Unknown time  . escitalopram (LEXAPRO) 20 MG tablet Take 20 mg by mouth daily.   07/10/2014 at 0800  . Fluticasone Furoate-Vilanterol 100-25 MCG/INH AEPB Inhale into the lungs.   Past Week at Unknown time  . glyBURIDE (DIABETA) 5 MG tablet Take 5 mg by mouth 2 (two) times daily with a meal.   07/09/2014 at Unknown time  .  levothyroxine (SYNTHROID, LEVOTHROID) 50 MCG tablet Take 50 mcg by mouth daily before breakfast.   07/10/2014 at 0800  . Melatonin 10 MG TABS Take 10 mg by mouth.   Past Week at Unknown time  . metFORMIN (GLUCOPHAGE) 500 MG tablet Take 500 mg by mouth 2 (two) times daily with a meal.   07/09/2014 at Unknown time  . morphine (MS CONTIN) 30 MG 12 hr tablet Take 30 mg by mouth 2 (two) times daily.   Past Week at Unknown time  . pregabalin (LYRICA) 200 MG capsule Take 200 mg by mouth 3 (three) times daily.   07/09/2014 at Unknown time  . temazepam (RESTORIL) 30 MG capsule Take 30 mg by mouth at bedtime as needed for  sleep.   Past Week at Unknown time  . VICTOZA 18 MG/3ML SOPN Inject 1.8 mg into the skin daily.   3 07/09/2014 at Unknown time   Allergies  Allergen Reactions  . Latex Itching    History  Substance Use Topics  . Smoking status: Former Smoker    Types: Cigarettes    Quit date: 12/01/2007  . Smokeless tobacco: Never Used  . Alcohol Use: No    History reviewed. No pertinent family history.   ROS ROS: I have reviewed the patient's review of systems thoroughly and there are no positive responses as relates to the HPI.  Objective:  Physical Exam  Vital signs in last 24 hours: Temp:  [98.7 F (37.1 C)] 98.7 F (37.1 C) (06/13 1112) Pulse Rate:  [83] 83 (06/13 1112) Resp:  [18] 18 (06/13 1112) BP: (133)/(78) 133/78 mmHg (06/13 1112) SpO2:  [98 %] 98 % (06/13 1112) Weight:  [234 lb 2.1 oz (106.201 kg)] 234 lb 2.1 oz (106.201 kg) (06/13 1103) Well-developed well-nourished patient in no acute distress. Alert and oriented x3 HEENT:within normal limits Cardiac: Regular rate and rhythm Pulmonary: Lungs clear to auscultation Abdomen: Soft and nontender.  Normal active bowel sounds  Musculoskeletal: (Left hip: Painful internal rotation.  Limited internal rotation.  Pain with flexion extension.  No instability.  Labs: Recent Results (from the past 2160 hour(s))  Glucose, capillary     Status: Abnormal   Collection Time: 06/30/14 10:10 AM  Result Value Ref Range   Glucose-Capillary 218 (H) 65 - 99 mg/dL  Surgical pcr screen     Status: None   Collection Time: 06/30/14 11:26 AM  Result Value Ref Range   MRSA, PCR NEGATIVE NEGATIVE   Staphylococcus aureus NEGATIVE NEGATIVE    Comment:        The Xpert SA Assay (FDA approved for NASAL specimens in patients over 9 years of age), is one component of a comprehensive surveillance program.  Test performance has been validated by [facility] for patients greater than or equal to 55 year old. It is not intended to diagnose  infection nor to guide or monitor treatment.   APTT     Status: None   Collection Time: 06/30/14 11:27 AM  Result Value Ref Range   aPTT 29 24 - 37 seconds  CBC WITH DIFFERENTIAL     Status: None   Collection Time: 06/30/14 11:27 AM  Result Value Ref Range   WBC 6.8 4.0 - 10.5 K/uL   RBC 4.50 3.87 - 5.11 MIL/uL   Hemoglobin 12.5 12.0 - 15.0 g/dL   HCT 39.1 36.0 - 46.0 %   MCV 86.9 78.0 - 100.0 fL   MCH 27.8 26.0 - 34.0 pg   MCHC 32.0 30.0 -  36.0 g/dL   RDW 13.7 11.5 - 15.5 %   Platelets 242 150 - 400 K/uL   Neutrophils Relative % 55 43 - 77 %   Neutro Abs 3.8 1.7 - 7.7 K/uL   Lymphocytes Relative 32 12 - 46 %   Lymphs Abs 2.2 0.7 - 4.0 K/uL   Monocytes Relative 9 3 - 12 %   Monocytes Absolute 0.6 0.1 - 1.0 K/uL   Eosinophils Relative 3 0 - 5 %   Eosinophils Absolute 0.2 0.0 - 0.7 K/uL   Basophils Relative 1 0 - 1 %   Basophils Absolute 0.0 0.0 - 0.1 K/uL  Comprehensive metabolic panel     Status: Abnormal   Collection Time: 06/30/14 11:27 AM  Result Value Ref Range   Sodium 139 135 - 145 mmol/L   Potassium 3.9 3.5 - 5.1 mmol/L   Chloride 102 101 - 111 mmol/L   CO2 26 22 - 32 mmol/L   Glucose, Bld 203 (H) 65 - 99 mg/dL   BUN 8 6 - 20 mg/dL   Creatinine, Ser 0.76 0.44 - 1.00 mg/dL   Calcium 9.3 8.9 - 10.3 mg/dL   Total Protein 7.4 6.5 - 8.1 g/dL   Albumin 3.8 3.5 - 5.0 g/dL   AST 36 15 - 41 U/L   ALT 30 14 - 54 U/L   Alkaline Phosphatase 71 38 - 126 U/L   Total Bilirubin 0.1 (L) 0.3 - 1.2 mg/dL   GFR calc non Af Amer >60 >60 mL/min   GFR calc Af Amer >60 >60 mL/min    Comment: (NOTE) The eGFR has been calculated using the CKD EPI equation. This calculation has not been validated in all clinical situations. eGFR's persistently <60 mL/min signify possible Chronic Kidney Disease.    Anion gap 11 5 - 15  Protime-INR     Status: None   Collection Time: 06/30/14 11:27 AM  Result Value Ref Range   Prothrombin Time 13.5 11.6 - 15.2 seconds   INR 1.01 0.00 - 1.49   Urinalysis, Routine w reflex microscopic     Status: Abnormal   Collection Time: 06/30/14 11:27 AM  Result Value Ref Range   Color, Urine YELLOW YELLOW   APPearance HAZY (A) CLEAR   Specific Gravity, Urine 1.026 1.005 - 1.030   pH 5.0 5.0 - 8.0   Glucose, UA NEGATIVE NEGATIVE mg/dL   Hgb urine dipstick NEGATIVE NEGATIVE   Bilirubin Urine NEGATIVE NEGATIVE   Ketones, ur NEGATIVE NEGATIVE mg/dL   Protein, ur NEGATIVE NEGATIVE mg/dL   Urobilinogen, UA 0.2 0.0 - 1.0 mg/dL   Nitrite NEGATIVE NEGATIVE   Leukocytes, UA SMALL (A) NEGATIVE  Hemoglobin A1c     Status: Abnormal   Collection Time: 06/30/14 11:27 AM  Result Value Ref Range   Hgb A1c MFr Bld 7.3 (H) 4.8 - 5.6 %    Comment: (NOTE)         Pre-diabetes: 5.7 - 6.4         Diabetes: >6.4         Glycemic control for adults with diabetes: <7.0    Mean Plasma Glucose 163 mg/dL    Comment: (NOTE) Performed At: [facility] [place], [place] [id number] Lindon Romp MD IE:[id number]   Urine microscopic-add on     Status: Abnormal   Collection Time: 06/30/14 11:27 AM  Result Value Ref Range   Squamous Epithelial / LPF FEW (A) RARE   WBC, UA 3-6 <3 WBC/hpf  Bacteria, UA FEW (A) RARE   Urine-Other MUCOUS PRESENT   Type and screen     Status: None   Collection Time: 06/30/14 11:35 AM  Result Value Ref Range   ABO/RH(D) O NEG    Antibody Screen NEG    Sample Expiration 07/14/2014   ABO/Rh     Status: None   Collection Time: 06/30/14 11:35 AM  Result Value Ref Range   ABO/RH(D) O NEG   Glucose, capillary     Status: Abnormal   Collection Time: 07/10/14 11:11 AM  Result Value Ref Range   Glucose-Capillary 145 (H) 65 - 99 mg/dL     Estimated body mass index is 37.81 kg/(m^2) as calculated from the following:   Height as of this encounter: '5\' 6"'  (1.676 m).   Weight as of this encounter: 234 lb 2.1 oz (106.201 kg).   Imaging Review Plain radiographs demonstrate severe degenerative joint disease  of the left hip(s). The bone quality appears to be good for age and reported activity level.  Assessment/Plan:  End stage arthritis, left hip(s)  The patient history, physical examination, clinical judgement of the provider and imaging studies are consistent with end stage degenerative joint disease of the left hip(s) and total hip arthroplasty is deemed medically necessary. The treatment options including medical management, injection therapy, arthroscopy and arthroplasty were discussed at length. The risks and benefits of total hip arthroplasty were presented and reviewed. The risks due to aseptic loosening, infection, stiffness, dislocation/subluxation,  thromboembolic complications and other imponderables were discussed.  The patient acknowledged the explanation, agreed to proceed with the plan and consent was signed. Patient is being admitted for inpatient treatment for surgery, pain control, PT, OT, prophylactic antibiotics, VTE prophylaxis, progressive ambulation and ADL's and discharge planning.The patient is planning to be discharged home with home health services

## 2014-07-10 NOTE — Transfer of Care (Signed)
Immediate Anesthesia Transfer of Care Note  Patient: [name]  Procedure(s) Performed: Procedure(s): TOTAL HIP ARTHROPLASTY ANTERIOR APPROACH (Left)  Patient Location: PACU  Anesthesia Type:Spinal  Level of Consciousness: awake, alert  and patient cooperative  Airway & Oxygen Therapy: Patient Spontanous Breathing and Patient connected to nasal cannula oxygen  Post-op Assessment: Report given to RN, Post -op Vital signs reviewed and stable and Patient moving all extremities  Post vital signs: Reviewed and stable  Last Vitals:  Filed Vitals:   07/10/14 1520  BP: 91/60  Pulse:   Temp: 36.4 C  Resp:     Complications: No apparent anesthesia complications

## 2014-07-10 NOTE — Discharge Instructions (Signed)

## 2014-07-10 NOTE — Anesthesia Preprocedure Evaluation (Addendum)
Anesthesia Evaluation  Patient identified by MRN, date of birth, ID band Patient awake    Reviewed: Allergy & Precautions, NPO status , Patient's Chart, lab work & pertinent test results  Airway Mallampati: II  TM Distance: >3 FB Neck ROM: full    Dental  (+) Edentulous Upper, Edentulous Lower, Dental Advisory Given   Pulmonary shortness of breath, asthma , sleep apnea and Continuous Positive Airway Pressure Ventilation , COPDformer smoker,  breath sounds clear to auscultation        Cardiovascular hypertension, Pt. on medications negative cardio ROS  Rhythm:regular Rate:Normal     Neuro/Psych Anxiety Depression    GI/Hepatic GERD-  Medicated and Controlled,  Endo/Other  diabetes, Well Controlled, Type 2, Oral Hypoglycemic AgentsHypothyroidism obese  Renal/GU      Musculoskeletal  (+) Arthritis -, Fibromyalgia -  Abdominal   Peds  Hematology   Anesthesia Other Findings   Reproductive/Obstetrics                           Anesthesia Physical Anesthesia Plan  ASA: III  Anesthesia Plan: MAC and Spinal   Post-op Pain Management:    Induction: Intravenous  Airway Management Planned: Simple Face Mask  Additional Equipment:   Intra-op Plan:   Post-operative Plan: Extubation in OR  Informed Consent: I have reviewed the patients History and Physical, chart, labs and discussed the procedure including the risks, benefits and alternatives for the proposed anesthesia with the patient or authorized representative who has indicated his/her understanding and acceptance.     Plan Discussed with: CRNA, Anesthesiologist and Surgeon  Anesthesia Plan Comments:        Anesthesia Quick Evaluation

## 2014-07-10 NOTE — Anesthesia Procedure Notes (Signed)
Spinal Patient location during procedure: OR Start time: 07/10/2014 12:28 PM End time: 07/10/2014 12:34 PM Staffing Anesthesiologist: HODIERNE, ADAM Performed by: anesthesiologist  Preanesthetic Checklist Completed: patient identified, site marked, surgical consent, pre-op evaluation, timeout performed, IV checked, risks and benefits discussed and monitors and equipment checked Spinal Block Patient position: sitting Prep: Betadine and site prepped and draped Patient monitoring: heart rate, cardiac monitor, continuous pulse ox and blood pressure Approach: midline Location: L2-3 Injection technique: single-shot Needle Needle type: Pencan  Needle gauge: 22 G Needle length: 10 cm Assessment Sensory level: T6 Additional Notes Pt tolerated the procedure well.

## 2014-07-10 NOTE — Anesthesia Postprocedure Evaluation (Signed)
  Anesthesia Post-op Note  Patient: [name]  Procedure(s) Performed: Procedure(s): TOTAL HIP ARTHROPLASTY ANTERIOR APPROACH (Left)  Patient Location: PACU  Anesthesia Type:Spinal  Level of Consciousness: awake, alert  and oriented  Airway and Oxygen Therapy: Patient Spontanous Breathing  Post-op Pain: none  Post-op Assessment: Post-op Vital signs reviewed, Patient's Cardiovascular Status Stable and Respiratory Function Stable   LLE Sensation: Decreased, Tingling   RLE Sensation: Decreased, Tingling L Sensory Level: S4-Perianal area (able to wiggle toes and lift hips off of bed) R Sensory Level: S4-Perianal area (able to wiggle toes and lift hips off of bed)  Post-op Vital Signs: Reviewed and stable  Last Vitals:  Filed Vitals:   07/10/14 1600  BP: 94/72  Pulse: 73  Temp:   Resp: 17    Complications: No apparent anesthesia complications

## 2014-07-11 ENCOUNTER — Encounter (HOSPITAL_COMMUNITY): Payer: Self-pay | Admitting: General Practice

## 2014-07-11 LAB — GLUCOSE, CAPILLARY
GLUCOSE-CAPILLARY: 145 mg/dL — AB (ref 65–99)
GLUCOSE-CAPILLARY: 181 mg/dL — AB (ref 65–99)
Glucose-Capillary: 148 mg/dL — ABNORMAL HIGH (ref 65–99)
Glucose-Capillary: 163 mg/dL — ABNORMAL HIGH (ref 65–99)

## 2014-07-11 LAB — BASIC METABOLIC PANEL
ANION GAP: 6 (ref 5–15)
BUN: 10 mg/dL (ref 6–20)
CHLORIDE: 106 mmol/L (ref 101–111)
CO2: 29 mmol/L (ref 22–32)
CREATININE: 0.73 mg/dL (ref 0.44–1.00)
Calcium: 8.9 mg/dL (ref 8.9–10.3)
GFR calc non Af Amer: >60 mL/min (ref >60)
GLUCOSE: 134 mg/dL — AB (ref 65–99)
Potassium: 4.3 mmol/L (ref 3.5–5.1)
Sodium: 141 mmol/L (ref 135–145)

## 2014-07-11 LAB — CBC
HCT: 32.1 % — ABNORMAL LOW (ref 36.0–46.0)
HEMOGLOBIN: 10.2 g/dL — AB (ref 12.0–15.0)
MCH: 28.1 pg (ref 26.0–34.0)
MCHC: 31.8 g/dL (ref 30.0–36.0)
MCV: 88.4 fL (ref 78.0–100.0)
Platelets: 190 10*3/uL (ref 150–400)
RBC: 3.63 MIL/uL — AB (ref 3.87–5.11)
RDW: 14 % (ref 11.5–15.5)
WBC: 6.6 10*3/uL (ref 4.0–10.5)

## 2014-07-11 NOTE — Progress Notes (Signed)
Subjective: 1 Day Post-Op Procedure(s) (LRB): TOTAL HIP ARTHROPLASTY ANTERIOR APPROACH (Left) Patient reports pain as moderate. Taking by mouth. Foley out this a.m. Has voided 1. Not out of bed yet.   Objective: Vital signs in last 24 hours: Temp:  [97.5 F (36.4 C)-98.7 F (37.1 C)] 98.7 F (37.1 C) (06/14 0442) Pulse Rate:  [72-97] 97 (06/14 0442) Resp:  [10-18] 17 (06/14 0442) BP: (87-133)/(54-78) 112/58 mmHg (06/14 0442) SpO2:  [95 %-99 %] 98 % (06/14 0442) Weight:  [106.201 kg (234 lb 2.1 oz)] 106.201 kg (234 lb 2.1 oz) (06/13 1103)  Intake/Output from previous day: 06/13 0701 - 06/14 0700 In: 4040.7 [P.O.:480; I.V.:3255.7; IV Piggyback:305] Out: 2650 [Urine:2200; Blood:450] Intake/Output this shift: Total I/O In: -  Out: 50 [Urine:50]   Recent Labs  07/11/14 0509  HGB 10.2*    Recent Labs  07/11/14 0509  WBC 6.6  RBC 3.63*  HCT 32.1*  PLT 190    Recent Labs  07/11/14 0509  NA 141  K 4.3  CL 106  CO2 29  BUN 10  CREATININE 0.73  GLUCOSE 134*  CALCIUM 8.9   No results for input(s): LABPT, INR in the last 72 hours. Left hip exam: Neurovascular intact Sensation intact distally Intact pulses distally Dorsiflexion/Plantar flexion intact Incision: dressing C/D/I Compartment soft  Assessment/Plan: 1 Day Post-Op Procedure(s) (LRB): TOTAL HIP ARTHROPLASTY ANTERIOR APPROACH (Left) Plan: Aspirin 325 mg twice daily with SCDs for DVT prophylaxis. Up with therapy Plan for discharge tomorrow  [name] 07/11/2014, 9:30 AM

## 2014-07-11 NOTE — Care Management (Signed)
Utilization review completed by Tarra Pence N. Dariela Stoker, RN BSN 

## 2014-07-11 NOTE — Progress Notes (Signed)
Pt states she does not need any assistance with CPAP or mask application and will place herself on when ready. CPAP is set at [facility] on room air via home ffm. RT will continue to monitor as needed.

## 2014-07-11 NOTE — Op Note (Signed)
NAME:  SOFIJA, ANTWI NO.:  [id number]  MEDICAL RECORD NO.:  [id number]  LOCATION:  [id number]                        FACILITY:  [facility]  PHYSICIAN:  Harvie Junior, M.D.   DATE OF BIRTH:  Jun 21, 1960  DATE OF PROCEDURE:  07/10/2014 DATE OF DISCHARGE:                              OPERATIVE REPORT   PREOPERATIVE DIAGNOSIS:  End-stage degenerative joint disease, left hip.  POSTOPERATIVE DIAGNOSIS:  End-stage degenerative joint disease, left hip.  PROCEDURE: 1: Left total hip replacement with a pinnacle cup size 48, size 8 Corail standard offset stem, a 32 mm +0 delta ceramic hip ball, and a +4 neutral liner. 2: interpretation of multiple intra-operative fluoroscopic images  SURGEON:  Harvie Junior, M.D.  ASSISTANT:  Marshia Ly, PA  ANESTHESIA:  General.  BRIEF HISTORY:  Ms. Ardyth Harps is a 54 year old female with a long history of significant complaints of left hip pain.  She had been treated conservatively for a period of time.  After failure of all conservative care, she is taken to the operating room for left total hip replacement.  Because of her young age, we felt anterior approach was appropriate.  She did have relatively large size with BMI of 37, but we felt that we could do this through an anterior approach.  She is brought into the operating room for this procedure.  DESCRIPTION OF PROCEDURE:  The patient was brought to the operating room.  After adequate anesthesia was obtained with general anesthetic, the patient was placed supine on the operating table.  She was then moved on to the HANA bed and all bony prominences were well padded.  The patient had her initial position placed.  Once this was completed, the patient underwent positioning and after time-out was called and we were satisfied with position on the HANA bed, we underwent an approach for an anterior approach to the hip, subcutaneous tissue down the level of the tensor fascia, tensor  fascia was identified and divided in line with its fibers.  Once that was completed, the tensor was retracted laterally and retractors were put in place on the anterior aspect of the hip. Capsulotomy was taken.  Tags were placed front and back and once that was done, retractors were replaced and original neck cut was made.  The head was removed.  The retractor was then put in place anterior and posterior in the hip and after sequential reaming up to a level of 47 mm, a 48 mm pinnacle cup was placed with 45 degrees of lateral opening and 30 degrees of anteversion.  Once this was placed, the hole eliminator was placed and a +4 neutral liner was placed.  Attention was then turned to the stem side.  Retractor was put in place under the femoral neck and the hip was externally rotated, put in a down over position.  Retractors were put in place, release of the posterior capsule was undertaken, and following this, the rongeur followed by a cookie cutter were used to gain entrance into the canal.  Once this was achieved, the chili pepper was used followed by a size 8 broach.  Size 8 broach was really the biggest broach that we could get in and we really struggled to get it down all the way, thought  it could be in a varus position.  We ultimately got a provisional neck on and put a +0 ball and the stem actually looked perfect in terms of its varus valgus alignment and the length was a couple mm long, but otherwise excellent.  At this point, the hip was dislocated, put back in this position.  We took the 8 and advanced the broach down.  Really this way we could get it, we re- cleaned out laterally with a curette and a rongeur and then advanced the 8 broach down, did get about 3 mm more of countersink and co-planed calcar plane to that level.  Once that was done, the final stem was then put in, the size 8 Corail stem, +0 ball was placed, reduction was undertaken, and final images were obtained at that  point.  Excellent range of motion and stability were achieved.  We did a stability check at 60 degrees of extension and 90 of external rotation.  No tendency towards subluxation or dislocation.  At this point, the wounds were irrigated, suctioned dry.  The anterior capsule was closed with Vicryl interrupted, the tensor fascia closed with 0-Vicryl, the skin with 0 and 2-0 Vicryl, and 3-0 Monocryl subcuticular.  Benzoin and Steri-Strips were applied.  Sterile compressive dressing was applied.  The patient was taken to the recovery room and was noted to be in a satisfactory condition with the estimated blood loss for the procedure was 500 mL.     Harvie Junior, M.D.   ______________________________ Harvie Junior, M.D.    Ranae Plumber  D:  07/10/2014  T:  07/11/2014  Job:  [id number]  cc:   Harvie Junior, M.D.

## 2014-07-11 NOTE — Progress Notes (Signed)
RT placed patient on CPAP for the night. Pt tolerating well at this time.  

## 2014-07-11 NOTE — Plan of Care (Signed)
Problem: Consults Goal: Diagnosis- Total Joint Replacement Outcome: Completed/Met Date Met:  07/11/14 Primary Total Hip  left

## 2014-07-11 NOTE — Care Management Note (Signed)
Case Management Note  Patient Details  Name: [name] MRN: [id number] Date of Birth: [birth year]  Subjective/Objective:                S/p left total hip arthroplasty    Action/Plan:   Set up with [facility] for HHPT by MD office. Spoke with patient, no change in discharge plan. Patient states that she will have family available to assist her after discharge. Contacted [facility] and requested rolling walker and 3N1 be delivered to patient's room.  Expected Discharge Date:                  Expected Discharge Plan:  Home w Home Health Services  In-House Referral:  NA  Discharge planning Services  CM Consult  Post Acute Care Choice:  Durable Medical Equipment, Home Health Choice offered to:  Patient  DME Arranged:  3-N-1, Walker rolling DME Agency:  [facility]  HH Arranged:  PT [facility] Agency:  [facility]  Status of Service:  In process, will continue to follow  Medicare Important Message Given:    Date Medicare IM Given:    Medicare IM give by:    Date Additional Medicare IM Given:    Additional Medicare Important Message give by:     If discussed at Long Length of Stay Meetings, dates discussed:    Additional Comments:  Monica Becton, RN 07/11/2014, 3:35 PM

## 2014-07-11 NOTE — Evaluation (Addendum)
Occupational Therapy Evaluation Patient Details Name: [name] MRN: [id number] DOB: [birth year] Today's Date: 07/11/2014    History of Present Illness Pt is a 54 y.o. F s/p L THA, direct anterior approach.  Pt's PMH includes DDD cervical and lumbar spine, COPD, neuropathy, asthma, chronic pain, depression and anxiety, SOB w/ exertion, fibromyalgia, type II DM, back surgery, B carpal tunnel release.   Clinical Impression   Pt s/p above. Pt independent with ADLs, PTA. Feel pt will benefit from acute OT to increase independence with BADLs prior to d/c. Plan to practice tub transfer next session.    Follow Up Recommendations  No OT follow up;Supervision - Intermittent    Equipment Recommendations  Other (comment) (AE)    Recommendations for Other Services       Precautions / Restrictions Precautions Precautions: Fall Precaution Comments: h/o falls Restrictions Weight Bearing Restrictions: Yes LLE Weight Bearing: Weight bearing as tolerated      Mobility Bed Mobility Overal bed mobility: Modified Independent (with HOB elevated; supine to sit)                Transfers Overall transfer level: Needs assistance   Transfers: Sit to/from Stand Sit to Stand: Min guard         General transfer comment: cue for hand placement.    Balance    No LOB in session. Balance not formally assessed. History of falls                                        ADL Overall ADL's : Needs assistance/impaired Eating/Feeding: Sitting;Independent               Upper Body Dressing : Set up;Sitting   Lower Body Dressing: Minimal assistance;With adaptive equipment;Sit to/from stand   Toilet Transfer: Min guard;RW;Ambulation (bed)           Functional mobility during ADLs: Min guard;Rolling walker General ADL Comments: Educated on safety such as safe footwear, use of bag on walker, rugs/items on floor, and sitting for LB ADLs. Educated on  LB dressing technique and AE. Pt practiced with sockaid. Discussed how 3 in 1 can be positioned with one leg in and one leg outside if it will not all fit.      Vision  Pt wears glasses   Perception     Praxis      Pertinent Vitals/Pain Pain Assessment: 0-10 Pain Score: 6  Pain Location: left hip Pain Descriptors / Indicators: Burning;Aching;Throbbing Pain Intervention(s): Monitored during session     Hand Dominance Right   Extremity/Trunk Assessment Upper Extremity Assessment Upper Extremity Assessment: Overall WFL for tasks assessed   Lower Extremity Assessment Lower Extremity Assessment: Defer to PT evaluation       Communication Communication Communication: No difficulties   Cognition Arousal/Alertness: Awake/alert Behavior During Therapy: WFL for tasks assessed/performed Overall Cognitive Status: Within Functional Limits for tasks assessed                     General Comments       Exercises       Shoulder Instructions      Home Living Family/patient expects to be discharged to:: Private residence Living Arrangements: Children;Other relatives (son, daughter, and their daughter) Available Help at Discharge: Family;Available 24 hours/day (son at all times) Type of Home: Mobile home Home Access: Stairs to enter Entrance Stairs-Number of Steps: 3 Entrance  Stairs-Rails: Left;Right;Can reach both Home Layout: One level     Bathroom Shower/Tub: Tub/shower unit         Home Equipment: Bedside commode;Walker - 2 wheels (delivered to room)          Prior Functioning/Environment Level of Independence: Independent        Comments: per PT evaluation, pt says she should have been using AD as she has fallen (hip would give out) 6-7x in the past month    OT Diagnosis: Acute pain   OT Problem List: Decreased knowledge of use of DME or AE;Decreased knowledge of precautions;Pain;Decreased activity tolerance;Decreased range of motion   OT  Treatment/Interventions: Self-care/ADL training;DME and/or AE instruction;Balance training;Patient/family education;Therapeutic activities    OT Goals(Current goals can be found in the care plan section) Acute Rehab OT Goals Patient Stated Goal: go home OT Goal Formulation: With patient Time For Goal Achievement: 07/18/14 Potential to Achieve Goals: Good ADL Goals Pt Will Perform Lower Body Dressing: sit to/from stand;with modified independence;with adaptive equipment Pt Will Transfer to Toilet: with modified independence;ambulating (3 in 1 over commode) Pt Will Perform Tub/Shower Transfer: Tub transfer;with supervision;ambulating;rolling walker;with set-up (3 in 1 versus tub bench)  OT Frequency: Min 2X/week   Barriers to D/C:            Co-evaluation              End of Session Equipment Utilized During Treatment: Gait belt;Rolling walker  Activity Tolerance: Patient tolerated treatment well Patient left: in bed;with call bell/phone within reach   Time: 1610-9604 OT Time Calculation (min): 16 min Charges:  OT General Charges $OT Visit: 1 Procedure OT Evaluation $Initial OT Evaluation Tier I: 1 Procedure G-CodesEarlie Raveling OTR/L [id number] 07/11/2014, 4:50 PM

## 2014-07-11 NOTE — Evaluation (Signed)
Physical Therapy Evaluation Patient Details Name: [name] MRN: [id number] DOB: [birth year] Today's Date: 07/11/2014   History of Present Illness  Pt is a 54 y/o F s/p L THA, direct ant approach.  Pt's PMH includes DDD cervical and lumbar spine, COPD, neuropathy, asthma, chronic pain, depression and anxiety, SOB w/ exertion, fibromyalgia, type II DM, back surgery, B carpal tunnel release.  Clinical Impression  Pt is s/p L THA resulting in the deficits listed below (see PT Problem List). Pt demonstrated ability to ambulate 60 ft this session. Pt w/ knee buckle x1 after ambulating 60 ft and pt sat in recliner chair.  Pt will benefit from skilled PT to increase their independence and safety with mobility to allow discharge to the venue listed below.     Follow Up Recommendations Home health PT;Supervision for mobility/OOB    Equipment Recommendations  Rolling walker with 5" wheels    Recommendations for Other Services       Precautions / Restrictions Precautions Precautions: Fall Precaution Comments: h/o falls Restrictions Weight Bearing Restrictions: Yes LLE Weight Bearing: Weight bearing as tolerated      Mobility  Bed Mobility Overal bed mobility: Needs Assistance Bed Mobility: Supine to Sit     Supine to sit: Min assist     General bed mobility comments: Min assist to guide LLE OOB. Pt w/ use of bed rail and increased time.  Cues for proper breathing technique as pt demonstrates valsalve maneuver.  Transfers Overall transfer level: Needs assistance Equipment used: Rolling walker (2 wheeled) Transfers: Sit to/from Stand Sit to Stand: Min guard         General transfer comment: Cues for proper hand placement, increased time.  Dec WB through LLE.  Ambulation/Gait Ambulation/Gait assistance: Min guard Ambulation Distance (Feet): 60 Feet Assistive device: Rolling walker (2 wheeled) Gait Pattern/deviations: Step-to pattern;Antalgic;Decreased stride  length;Decreased weight shift to left;Trunk flexed   Gait velocity interpretation: Below normal speed for age/gender General Gait Details: Inc WB through BUEs, dec weight shift to LLE.  Pt w/ knee buckle x1 after ambulating 60 ft but pt was able to maintain balance w/ use of RW.    Stairs            Wheelchair Mobility    Modified Rankin (Stroke Patients Only)       Balance Overall balance assessment: Needs assistance;History of Falls Sitting-balance support: Bilateral upper extremity supported;Feet supported Sitting balance-Leahy Scale: Fair     Standing balance support: Bilateral upper extremity supported;During functional activity Standing balance-Leahy Scale: Fair                               Pertinent Vitals/Pain Pain Assessment: 0-10 Pain Score: 5  Pain Location: L hip Pain Descriptors / Indicators: Aching;Discomfort;Grimacing;Guarding Pain Intervention(s): Limited activity within patient's tolerance;Monitored during session;Repositioned    Home Living Family/patient expects to be discharged to:: Private residence Living Arrangements: Children;Other relatives (son, daughter, and their daughter) Available Help at Discharge: Family;Available 24 hours/day (son at all times) Type of Home: Mobile home Home Access: Stairs to enter Entrance Stairs-Rails: Left;Right;Can reach both Entrance Stairs-Number of Steps: 3 Home Layout: One level Home Equipment: None      Prior Function Level of Independence: Independent         Comments: says she should have been using AD as she has fallen (hip would give out) 6-7x in the past month     Hand Dominance   Dominant  Hand: Right    Extremity/Trunk Assessment               Lower Extremity Assessment: LLE deficits/detail   LLE Deficits / Details: weakness and limited ROM as expected s/p L THA     Communication   Communication: No difficulties  Cognition Arousal/Alertness: Awake/alert Behavior  During Therapy: WFL for tasks assessed/performed Overall Cognitive Status: Within Functional Limits for tasks assessed                      General Comments      Exercises Total Joint Exercises Ankle Circles/Pumps: AROM;Both;10 reps;Supine Quad Sets: AROM;Both;5 reps;Supine Heel Slides: AROM;Both;5 reps;Supine      Assessment/Plan    PT Assessment Patient needs continued PT services  PT Diagnosis Difficulty walking;Abnormality of gait;Acute pain;Generalized weakness   PT Problem List Decreased strength;Decreased range of motion;Decreased activity tolerance;Decreased balance;Decreased mobility;Decreased coordination;Decreased knowledge of use of DME;Decreased safety awareness;Decreased knowledge of precautions;Impaired sensation;Decreased skin integrity;Obesity;Pain  PT Treatment Interventions DME instruction;Gait training;Stair training;Functional mobility training;Therapeutic activities;Therapeutic exercise;Balance training;Neuromuscular re-education;Patient/family education;Modalities   PT Goals (Current goals can be found in the Care Plan section) Acute Rehab PT Goals Patient Stated Goal: to go home PT Goal Formulation: With patient Time For Goal Achievement: 07/18/14 Potential to Achieve Goals: Good    Frequency 7X/week   Barriers to discharge Inaccessible home environment 3 steps to enter home    Co-evaluation               End of Session Equipment Utilized During Treatment: Gait belt Activity Tolerance: Patient limited by fatigue Patient left: in chair;with call bell/phone within reach Nurse Communication: Mobility status;Precautions;Weight bearing status         Time: 5170-0174 PT Time Calculation (min) (ACUTE ONLY): 28 min   Charges:   PT Evaluation $Initial PT Evaluation Tier I: 1 Procedure PT Treatments $Gait Training: 8-22 mins   PT G CodesMichail Jewels PT, DPT [phone] Pager: [phone] 07/11/2014, 11:47 AM

## 2014-07-11 NOTE — Progress Notes (Signed)
Physical Therapy Treatment Patient Details Name: [name] MRN: [id number] DOB: [birth year] Today's Date: 07/11/2014    History of Present Illness Pt is a 54 y.o. F s/p L THA, direct ant approach.  Pt's PMH includes DDD cervical and lumbar spine, COPD, neuropathy, asthma, chronic pain, depression and anxiety, SOB w/ exertion, fibromyalgia, type II DM, back surgery, B carpal tunnel release.    PT Comments    Pt demonstrated ability to ambulate 180 ft and ascend/descend 2 steps this session.  Pt would benefit from further practice w/ stair training prior to d/c (see notes below).  Pt will benefit from continued skilled PT services to increase functional independence and safety.   Follow Up Recommendations  Home health PT;Supervision for mobility/OOB     Equipment Recommendations  Rolling walker with 5" wheels    Recommendations for Other Services       Precautions / Restrictions Precautions Precautions: Fall Precaution Comments: h/o falls Restrictions Weight Bearing Restrictions: Yes LLE Weight Bearing: Weight bearing as tolerated    Mobility  Bed Mobility Overal bed mobility: Needs Assistance Bed Mobility: Supine to Sit;Sit to Supine     Supine to sit: Modified independent (Device/Increase time) Sit to supine: Min assist   General bed mobility comments: Mod I supine>sit w/ increased time and use of bed rails.  Min assist managing LLE sit>supine.  Transfers Overall transfer level: Needs assistance Equipment used: Rolling walker (2 wheeled) Transfers: Sit to/from Stand Sit to Stand: Min guard         General transfer comment: cues for hand placement.  Ambulation/Gait Ambulation/Gait assistance: Min guard Ambulation Distance (Feet): 180 Feet Assistive device: Rolling walker (2 wheeled) Gait Pattern/deviations: Step-to pattern;Decreased stride length;Antalgic;Trunk flexed   Gait velocity interpretation: Below normal speed for  age/gender General Gait Details: Continues to demonstrated inc WB thorugh BUEs.  Cues to tighten LLE prior to stepping w/ LLE to ensure stability, pt reports improved stability w/ this technique.   Stairs Stairs: Yes Stairs assistance: Min guard Stair Management: Two rails;Forwards;Step to pattern Number of Stairs: 2 General stair comments: Cues for technique.  Pt w/ dec L knee F during descent causing pain when stepping down w/ RLE.  Instructed pt to allow L knee to bed to avoid L hip pain.  Wheelchair Mobility    Modified Rankin (Stroke Patients Only)       Balance Overall balance assessment: Needs assistance;History of Falls Sitting-balance support: Bilateral upper extremity supported;Feet supported Sitting balance-Leahy Scale: Fair     Standing balance support: Bilateral upper extremity supported;During functional activity Standing balance-Leahy Scale: Fair                      Cognition Arousal/Alertness: Awake/alert Behavior During Therapy: WFL for tasks assessed/performed Overall Cognitive Status: Within Functional Limits for tasks assessed                      Exercises Total Joint Exercises Hip ABduction/ADduction: AROM;Left;10 reps;Standing Long Arc Quad: AROM;Both;10 reps;Seated Knee Flexion: AROM;Left;10 reps;Standing    General Comments        Pertinent Vitals/Pain Pain Assessment: 0-10 Pain Score: 5  Pain Location: L hip Pain Descriptors / Indicators: Aching;Constant Pain Intervention(s): Limited activity within patient's tolerance;Monitored during session;Repositioned;RN gave pain meds during session    Home Living Family/patient expects to be discharged to:: Private residence Living Arrangements: Children;Other relatives (son, daughter, and their daughter) Available Help at Discharge: Family;Available 24 hours/day (son at all times) Type of Home:  Mobile home Home Access: Stairs to enter Entrance Stairs-Rails: Left;Right;Can  reach both Home Layout: One level Home Equipment: Bedside commode;Walker - 2 wheels (delivered to room)      Prior Function Level of Independence: Independent      Comments: says she should have been using AD as she has fallen (hip would give out) 6-7x in the past month   PT Goals (current goals can now be found in the care plan section) Acute Rehab PT Goals Patient Stated Goal: go home PT Goal Formulation: With patient Time For Goal Achievement: 07/18/14 Potential to Achieve Goals: Good Progress towards PT goals: Progressing toward goals    Frequency  7X/week    PT Plan Current plan remains appropriate    Co-evaluation             End of Session Equipment Utilized During Treatment: Gait belt Activity Tolerance: Patient limited by fatigue Patient left: in bed;with call bell/phone within reach;with SCD's reapplied     Time: 3704-8889 PT Time Calculation (min) (ACUTE ONLY): 28 min  Charges:  $Gait Training: 8-22 mins $Therapeutic Exercise: 8-22 mins                    G Codes:      Michail Jewels PT, DPT [phone] Pager: [phone] 07/11/2014, 5:02 PM

## 2014-07-12 LAB — URINALYSIS, ROUTINE W REFLEX MICROSCOPIC
BILIRUBIN URINE: NEGATIVE
GLUCOSE, UA: NEGATIVE mg/dL
Hgb urine dipstick: NEGATIVE
KETONES UR: NEGATIVE mg/dL
Leukocytes, UA: NEGATIVE
Nitrite: NEGATIVE
Protein, ur: NEGATIVE mg/dL
Specific Gravity, Urine: 1.006 (ref 1.005–1.030)
Urobilinogen, UA: 0.2 mg/dL (ref 0.0–1.0)
pH: 5 (ref 5.0–8.0)

## 2014-07-12 LAB — GLUCOSE, CAPILLARY
GLUCOSE-CAPILLARY: 208 mg/dL — AB (ref 65–99)
GLUCOSE-CAPILLARY: 159 mg/dL — AB (ref 65–99)
Glucose-Capillary: 173 mg/dL — ABNORMAL HIGH (ref 65–99)

## 2014-07-12 LAB — CBC
HCT: 31.4 % — ABNORMAL LOW (ref 36.0–46.0)
Hemoglobin: 9.9 g/dL — ABNORMAL LOW (ref 12.0–15.0)
MCH: 27.7 pg (ref 26.0–34.0)
MCHC: 31.5 g/dL (ref 30.0–36.0)
MCV: 88 fL (ref 78.0–100.0)
PLATELETS: 192 10*3/uL (ref 150–400)
RBC: 3.57 MIL/uL — AB (ref 3.87–5.11)
RDW: 14.1 % (ref 11.5–15.5)
WBC: 7.3 10*3/uL (ref 4.0–10.5)

## 2014-07-12 NOTE — Progress Notes (Signed)
OT recommended tub bench, contacted Fayrene Fearing with [facility] and requested tub bench be delivered to patient's room. Rolling walker and 3N1 already delivered to patient's room, no other d/c needs identified.

## 2014-07-12 NOTE — Progress Notes (Signed)
Occupational Therapy Treatment Patient Details Name: [name] MRN: [id number] DOB: [birth year] Today's Date: 07/12/2014    History of present illness Pt is a 54 y.o. F s/p L THA, direct ant approach.  Pt's PMH includes DDD cervical and lumbar spine, COPD, neuropathy, asthma, chronic pain, depression and anxiety, SOB w/ exertion, fibromyalgia, type II DM, back surgery, B carpal tunnel release.   OT comments  Pt progressing towards acute OT goals. Pt completed ADLs and education provided as detailed below. Pt would benefit from tub transfer bench at d/c. D/c plan remains appropriate.   Follow Up Recommendations  No OT follow up;Supervision - Intermittent    Equipment Recommendations  Tub/shower bench    Recommendations for Other Services      Precautions / Restrictions Precautions Precautions: Fall Precaution Comments: h/o falls Restrictions Weight Bearing Restrictions: Yes LLE Weight Bearing: Weight bearing as tolerated       Mobility Bed Mobility Overal bed mobility: Needs Assistance Bed Mobility: Supine to Sit;Sit to Supine     Supine to sit: Min guard Sit to supine: Min assist   General bed mobility comments: extra effort and min guard for safety supine>EOB. Min A EOB>supine to advance LLE. Educated on technique and compensatory strategies for bed mobility at home.  Transfers Overall transfer level: Needs assistance Equipment used: Rolling walker (2 wheeled) Transfers: Sit to/from Stand Sit to Stand: Supervision         General transfer comment: cues for technique with rw. Supervision for safety.    Balance Overall balance assessment: Needs assistance Sitting-balance support: Bilateral upper extremity supported;Feet supported Sitting balance-Leahy Scale: Fair Sitting balance - Comments: EOB and BSC   Standing balance support: Bilateral upper extremity supported;During functional activity Standing balance-Leahy Scale: Fair                     ADL Overall ADL's : Needs assistance/impaired                         Toilet Transfer: Min Marine scientist Details (indicate cue type and reason): ambulated to [facility] in room Toileting- Clothing Manipulation and Hygiene: Sitting/lateral lean;Set up [place] Details (indicate cue type and reason): completed in sitting position with setup   Tub/Shower Transfer Details (indicate cue type and reason): educated on tub transfer using tub transfer bench  Functional mobility during ADLs: Min guard;Rolling walker General ADL Comments: Completed toilet transfer as detailed above. Pt has hip kit for LB ADLs, instructed in AE use and compensatory strategies. Educated on tub transfer bench transfer.       Vision                     Perception     Praxis      Cognition   Behavior During Therapy: WFL for tasks assessed/performed Overall Cognitive Status: Within Functional Limits for tasks assessed                       Extremity/Trunk Assessment               Exercises   Shoulder Instructions       General Comments      Pertinent Vitals/ Pain       Pain Assessment: Faces Pain Score: 8  Faces Pain Scale: Hurts even more Pain Location: Lt hip Pain Descriptors / Indicators: Aching;Grimacing Pain Intervention(s): Limited activity within patient's tolerance;Monitored during session;Repositioned  Home  Living                                          Prior Functioning/Environment              Frequency Min 2X/week     Progress Toward Goals  OT Goals(current goals can now be found in the care plan section)  Progress towards OT goals: Progressing toward goals  Acute Rehab OT Goals Patient Stated Goal: get better OT Goal Formulation: With patient Time For Goal Achievement: 07/18/14 Potential to Achieve Goals: Good ADL Goals Pt Will Perform Lower Body Dressing: sit  to/from stand;with modified independence;with adaptive equipment Pt Will Transfer to Toilet: with modified independence;ambulating Pt Will Perform Tub/Shower Transfer: Tub transfer;with supervision;ambulating;rolling walker;with set-up  Plan Discharge plan remains appropriate    Co-evaluation                 End of Session Equipment Utilized During Treatment: Gait belt;Rolling walker   Activity Tolerance Patient tolerated treatment well   Patient Left in bed;with call bell/phone within reach   Nurse Communication          Time: 0044-7158 OT Time Calculation (min): 28 min  Charges: OT General Charges $OT Visit: 1 Procedure OT Treatments $Self Care/Home Management : 23-37 mins  [name] 07/12/2014, 2:57 PM

## 2014-07-12 NOTE — Discharge Summary (Signed)
Patient ID: [name] MRN: [id number] DOB/AGE: 07/03/1960 54 y.o.  Admit date: 07/10/2014 Discharge date: 07/12/2014  Admission Diagnoses:  Principal Problem:   Primary osteoarthritis of left hip   Discharge Diagnoses:  Same  Past Medical History  Diagnosis Date  . DDD (degenerative disc disease), cervical   . DDD (degenerative disc disease), lumbar   . COPD (chronic obstructive pulmonary disease)   . Neuropathy   . Hypothyroid   . Asthma   . Chronic pain   . Depression   . Anxiety   . Shortness of breath     WITH EXERTION   . Sleep apnea     w/ CPAP  . Fibromyalgia   . IBS (irritable bowel syndrome)   . Full dentures   . Wears glasses   . Pneumonia   . Diabetes mellitus without complication     Type II  . GERD (gastroesophageal reflux disease)     not taking medications  . H. pylori infection     Surgeries: Procedure(s):left TOTAL HIP ARTHROPLASTY ANTERIOR APPROACH on 07/10/2014     Discharged Condition: Improved  Hospital Course: [name] is an 54 y.o. female who was admitted 07/10/2014 for operative treatment ofPrimary osteoarthritis of left hip. Patient has severe unremitting pain that affects sleep, daily activities, and work/hobbies. After pre-op clearance the patient was taken to the operating room on 07/10/2014 and underwent  Procedure(s):left TOTAL HIP ARTHROPLASTY ANTERIOR APPROACH.    Patient was given perioperative antibiotics: Anti-infectives    Start     Dose/Rate Route Frequency Ordered Stop   07/10/14 1830  ceFAZolin (ANCEF) IVPB 2 g/50 mL premix     2 g 100 mL/hr over 30 Minutes Intravenous Every 6 hours 07/10/14 1733 07/11/14 0629   07/10/14 1031  ceFAZolin (ANCEF) IVPB 2 g/50 mL premix     2 g 100 mL/hr over 30 Minutes Intravenous On call to O.R. 07/10/14 1031 07/10/14 1240       Patient was given sequential compression devices, early ambulation, and chemoprophylaxis to prevent DVT.she did have a  fever on postoperative night #1. It was resolved with oral Tylenol. She had no urinary symptoms. A urinalysis was obtained which was negative. Her lungs were clear. Her O2 sats were at an acceptable level. Her left hip wound was benign.incentive spirometry was encouraged.  Patient benefited maximally from hospital stay and there were no complications.    Recent vital signs: Patient Vitals for the past 24 hrs:  BP Temp Temp src Pulse Resp SpO2  07/12/14 1131 - 98.6 F (37 C) Oral - - -  07/12/14 0651 114/64 mmHg 99.7 F (37.6 C) Oral (!) 104 17 90 %  07/12/14 0634 - 99 F (37.2 C) Oral - - -  07/12/14 0632 - (!) 102.5 F (39.2 C) Rectal - - -  07/12/14 0540 - (!) 103.2 F (39.6 C) Rectal - - -  07/12/14 0512 - 100.3 F (37.9 C) Oral - - -  07/12/14 0502 (!) 119/52 mmHg (!) 102.9 F (39.4 C) Oral (!) 122 18 90 %  07/11/14 2002 121/60 mmHg (!) 100.5 F (38.1 C) Oral 98 17 94 %  07/11/14 1648 (!) 129/55 mmHg - Oral 100 18 96 %     Recent laboratory studies:  Recent Labs  07/11/14 0509 07/12/14 0615  WBC 6.6 7.3  HGB 10.2* 9.9*  HCT 32.1* 31.4*  PLT 190 192  NA 141  --   K 4.3  --   CL 106  --  CO2 29  --   BUN 10  --   CREATININE 0.73  --   GLUCOSE 134*  --   CALCIUM 8.9  --      Discharge Medications:     Medication List    TAKE these medications        aspirin EC 325 MG tablet  Take 1 tablet (325 mg total) by mouth 2 (two) times daily after a meal. Take x 1 month post op to decrease risk of blood clots.     clonazePAM 1 MG tablet  Commonly known as:  KLONOPIN  Take 1 mg by mouth 3 (three) times daily as needed for anxiety.     cyclobenzaprine 10 MG tablet  Commonly known as:  FLEXERIL  Take 10 mg by mouth daily as needed for muscle spasms.     escitalopram 20 MG tablet  Commonly known as:  LEXAPRO  Take 20 mg by mouth daily.     Fluticasone Furoate-Vilanterol 100-25 MCG/INH Aepb  Inhale into the lungs.     glyBURIDE 5 MG tablet  Commonly known as:   DIABETA  Take 5 mg by mouth 2 (two) times daily with a meal.     levothyroxine 50 MCG tablet  Commonly known as:  SYNTHROID, LEVOTHROID  Take 50 mcg by mouth daily before breakfast.     Melatonin 10 MG Tabs  Take 10 mg by mouth.     metFORMIN 500 MG tablet  Commonly known as:  GLUCOPHAGE  Take 500 mg by mouth 2 (two) times daily with a meal.     methocarbamol 750 MG tablet  Commonly known as:  ROBAXIN-750  Take 1 tablet (750 mg total) by mouth every 8 (eight) hours as needed for muscle spasms.     morphine 30 MG 12 hr tablet  Commonly known as:  MS CONTIN  Take 30 mg by mouth 2 (two) times daily.     oxyCODONE-acetaminophen 5-325 MG per tablet  Commonly known as:  PERCOCET/ROXICET  Take 1-2 tablets by mouth every 4 (four) hours as needed for severe pain.     pregabalin 200 MG capsule  Commonly known as:  LYRICA  Take 200 mg by mouth 3 (three) times daily.     temazepam 30 MG capsule  Commonly known as:  RESTORIL  Take 30 mg by mouth at bedtime as needed for sleep.     VICTOZA 18 MG/3ML Sopn  Generic drug:  Liraglutide  Inject 1.8 mg into the skin daily.        Diagnostic Studies: Dg Hip Operative Unilat With Pelvis Left  07/10/2014   CLINICAL DATA:  Status post left hip replacement. Operative imaging.  EXAM: OPERATIVE LEFT HIP (WITH PELVIS IF PERFORMED) 1 VIEW  TECHNIQUE: Fluoroscopic spot image(s) were submitted for interpretation post-operatively.  FLUOROSCOPY TIME:  Radiation Exposure Index (as provided by the fluoroscopic device):  If the device does not provide the exposure index:  Fluoroscopy Time:  0 minutes and 22 seconds  Number of Acquired Images:  1  COMPARISON:  05/30/2012  FINDINGS: Femoral and acetabular components of the left hip arthroplasty appear well seated and well aligned on this single view. There is no acute fracture or evidence of an operative complication.  IMPRESSION: Well aligned left hip prosthesis.   Electronically Signed   By: Amie Portland M.D.    On: 07/10/2014 15:52    Disposition: 01-Home or Self Care      Discharge Instructions    Call MD /  Call 911    Complete by:  As directed   If you experience chest pain or shortness of breath, CALL 911 and be transported to the hospital emergency room.  If you develope a fever above 101 F, pus (white drainage) or increased drainage or redness at the wound, or calf pain, call your surgeon's office.     Constipation Prevention    Complete by:  As directed   Drink plenty of fluids.  Prune juice may be helpful.  You may use a stool softener, such as Colace (over the counter) 100 mg twice a day.  Use MiraLax (over the counter) for constipation as needed.     Diet Carb Modified    Complete by:  As directed      Do not sit on low chairs, stoools or toilet seats, as it may be difficult to get up from low surfaces    Complete by:  As directed      Follow the hip precautions as taught in Physical Therapy    Complete by:  As directed      Increase activity slowly as tolerated    Complete by:  As directed      Weight bearing as tolerated    Complete by:  As directed   Extremity:  Lower     Weight bearing as tolerated    Complete by:  As directed   Laterality:  left  Extremity:  Lower           Follow-up Information    Follow up with GRAVES,JOHN L, MD. Schedule an appointment as soon as possible for a visit in 10 days.   Specialty:  Orthopedic Surgery   Contact information:   [address] [place] [address] [phone]       Follow up with [facility].   Why:  They will contact you to schedule home therapy visits.   Contact information:   [address] [place] [address] [phone]        Signed: Matthew Folks 07/12/2014, 12:39 PM

## 2014-07-12 NOTE — Progress Notes (Signed)
Subjective: 2 Days Post-Op Procedure(s) (LRB): TOTAL HIP ARTHROPLASTY ANTERIOR APPROACH (Left) Patient reports pain as moderate. Taking by mouth and voiding okay. Positive flatus. No urinary symptoms other than some hesitancy. No dysuria. The patient had a fever last night which resolved with oral Tylenol, Percocet, and Robaxin. She denies chest pain or shortness of breath. Her O2 sats are good. She has been using her incentive spirometer. She has moderate hip pain.   Objective: Vital signs in last 24 hours: Temp:  [98.6 F (37 C)-103.2 F (39.6 C)] 98.6 F (37 C) (06/15 1131) Pulse Rate:  [98-122] 104 (06/15 0651) Resp:  [17-18] 17 (06/15 0651) BP: (114-129)/(52-64) 114/64 mmHg (06/15 0651) SpO2:  [90 %-96 %] 90 % (06/15 0651)  Intake/Output from previous day: 06/14 0701 - 06/15 0700 In: 960 [P.O.:960] Out: 700 [Urine:700] Intake/Output this shift: Total I/O In: 240 [P.O.:240] Out: -    Recent Labs  07/11/14 0509 07/12/14 0615  HGB 10.2* 9.9*    Recent Labs  07/11/14 0509 07/12/14 0615  WBC 6.6 7.3  RBC 3.63* 3.57*  HCT 32.1* 31.4*  PLT 190 192    Recent Labs  07/11/14 0509  NA 141  K 4.3  CL 106  CO2 29  BUN 10  CREATININE 0.73  GLUCOSE 134*  CALCIUM 8.9  urinalysis this morning showed no significant abnormalities. No results for input(s): LABPT, INR in the last 72 hours. Left hip exam: Neurovascular intact Sensation intact distally Intact pulses distally Dorsiflexion/Plantar flexion intact Incision: dressing C/D/I Compartment soft Lungs are clear to auscultation in all lung fields. Assessment/Plan: 2 Days Post-Op Procedure(s) (LRB): TOTAL HIP ARTHROPLASTY ANTERIOR APPROACH (Left)  Plan: Work with physical therapy this a.m. Urinalysis was obtained which was negative. Encourage incentive spirometry. Weight-bear as tolerated on the left without hip precautions. Discharge home with home health Follow-up with Dr. Luiz Blare in 10 days. [name]  G 07/12/2014, 11:45 AM

## 2014-07-12 NOTE — Progress Notes (Signed)
Physical Therapy Treatment Patient Details Name: [name] MRN: [id number] DOB: November 21, 1960 Today's Date: 07/12/2014    History of Present Illness Pt is a 54 y.o. F s/p L THA, direct ant approach.  Pt's PMH includes DDD cervical and lumbar spine, COPD, neuropathy, asthma, chronic pain, depression and anxiety, SOB w/ exertion, fibromyalgia, type II DM, back surgery, B carpal tunnel release.    PT Comments    Pt ambulated 150 ft this session, limited by fatigue of LLE. Patient is making good progress with PT.  From a mobility standpoint anticipate patient will be ready for DC home today.  Pt will benefit from continued skilled PT services to increase functional independence and safety.   Follow Up Recommendations  Home health PT;Supervision for mobility/OOB     Equipment Recommendations  Rolling walker with 5" wheels    Recommendations for Other Services       Precautions / Restrictions Precautions Precautions: Fall Precaution Comments: h/o falls Restrictions Weight Bearing Restrictions: Yes LLE Weight Bearing: Weight bearing as tolerated    Mobility  Bed Mobility Overal bed mobility: Needs Assistance Bed Mobility: Supine to Sit     Supine to sit: Supervision Sit to supine: Min assist   General bed mobility comments: Inc time.  HOB flat and no use of bed rails.  Transfers Overall transfer level: Needs assistance Equipment used: Rolling walker (2 wheeled) Transfers: Sit to/from Stand Sit to Stand: Supervision         General transfer comment: Supervision for safety  Ambulation/Gait Ambulation/Gait assistance: Min guard Ambulation Distance (Feet): 150 Feet Assistive device: Rolling walker (2 wheeled) Gait Pattern/deviations: Step-to pattern;Antalgic;Decreased stride length   Gait velocity interpretation: Below normal speed for age/gender General Gait Details: Pt w/ dec WB through BUEs this session w/ upright posture.  Dec gait speed and mild  L hip hike.   Stairs            Wheelchair Mobility    Modified Rankin (Stroke Patients Only)       Balance Overall balance assessment: Needs assistance;History of Falls Sitting-balance support: Feet supported;No upper extremity supported Sitting balance-Leahy Scale: Fair Sitting balance - Comments: EOB and BSC   Standing balance support: Bilateral upper extremity supported;During functional activity Standing balance-Leahy Scale: Fair                      Cognition Arousal/Alertness: Awake/alert Behavior During Therapy: WFL for tasks assessed/performed Overall Cognitive Status: Within Functional Limits for tasks assessed                      Exercises      General Comments        Pertinent Vitals/Pain Pain Assessment: 0-10 Pain Score: 6  Faces Pain Scale: Hurts even more Pain Location: L hip Pain Descriptors / Indicators: Aching;Jabbing Pain Intervention(s): Limited activity within patient's tolerance;Monitored during session;Repositioned    Home Living                      Prior Function            PT Goals (current goals can now be found in the care plan section) Acute Rehab PT Goals Patient Stated Goal: get better PT Goal Formulation: With patient Time For Goal Achievement: 07/18/14 Potential to Achieve Goals: Good Progress towards PT goals: Progressing toward goals    Frequency  7X/week    PT Plan Current plan remains appropriate    Co-evaluation  End of Session Equipment Utilized During Treatment: Gait belt Activity Tolerance: Patient limited by fatigue Patient left: in chair;with call bell/phone within reach     Time: 1039-1105 PT Time Calculation (min) (ACUTE ONLY): 26 min  Charges:  $Gait Training: 23-37 mins                    G Codes:      Michail Jewels PT, [place] [phone] Pager: [phone] 07/12/2014, 4:57 PM

## 2014-07-12 NOTE — Progress Notes (Signed)
Physical Therapy Treatment Patient Details Name: [name] MRN: [id number] DOB: [birth year] Today's Date: 07/12/2014    History of Present Illness Pt is a 54 y.o. F s/p L THA, direct ant approach.  Pt's PMH includes DDD cervical and lumbar spine, COPD, neuropathy, asthma, chronic pain, depression and anxiety, SOB w/ exertion, fibromyalgia, type II DM, back surgery, B carpal tunnel release.    PT Comments    Pt making progress this session w/ improved mechanics during stair training.  Pt w/ fatigue after ambulating 90 ft and completing stair training.  Pt will benefit from continued skilled PT services to increase functional independence and safety.   Follow Up Recommendations  Home health PT;Supervision for mobility/OOB     Equipment Recommendations  Rolling walker with 5" wheels    Recommendations for Other Services       Precautions / Restrictions Precautions Precautions: Fall Precaution Comments: h/o falls Restrictions Weight Bearing Restrictions: Yes LLE Weight Bearing: Weight bearing as tolerated    Mobility  Bed Mobility Overal bed mobility: Needs Assistance Bed Mobility: Supine to Sit     Supine to sit: Supervision     General bed mobility comments: Supervision w/ VCs for technique as pt demonstrated L hip IR, causing L hip pain.  Pt's HOB flat and no use of rails.  Transfers Overall transfer level: Needs assistance Equipment used: Rolling walker (2 wheeled) Transfers: Sit to/from Stand Sit to Stand: Supervision         General transfer comment: cues for hand placement.  Pt continues to place B hands on RW during sit>stand  Ambulation/Gait Ambulation/Gait assistance: Min guard Ambulation Distance (Feet): 90 Feet Assistive device: Rolling walker (2 wheeled) Gait Pattern/deviations: Step-to pattern;Antalgic;Decreased stride length;Decreased stance time - left;Trunk flexed   Gait velocity interpretation: Below normal speed for  age/gender General Gait Details: Continues to demonstrated inc WB thorugh BUEs.  Cues to begin to increase WB through BLEs as comfortable.  Dec L knee flexion.  Cues to tighten LLE prior to stepping w/ LLE to ensure stability, pt reports improved stability w/ this technique.   Stairs Stairs: Yes Stairs assistance: Min guard Stair Management: Two rails;Forwards;Step to pattern Number of Stairs: 2 General stair comments: Pt w/ good carryover for technique.  Pt allowed L knee to flex during ascent which dec amount of pain and pressure felt in L hip.  Wheelchair Mobility    Modified Rankin (Stroke Patients Only)       Balance Overall balance assessment: Needs assistance;History of Falls Sitting-balance support: Bilateral upper extremity supported;Feet supported Sitting balance-Leahy Scale: Fair     Standing balance support: Bilateral upper extremity supported;During functional activity Standing balance-Leahy Scale: Fair                      Cognition Arousal/Alertness: Awake/alert Behavior During Therapy: WFL for tasks assessed/performed Overall Cognitive Status: Within Functional Limits for tasks assessed                      Exercises Total Joint Exercises Knee Flexion: AROM;Left;10 reps;Standing Standing Hip Extension: AROM;Left;10 reps;Standing    General Comments        Pertinent Vitals/Pain Pain Assessment: 0-10 Pain Score: 8  Pain Location: L hip Pain Descriptors / Indicators: Aching;Discomfort;Grimacing Pain Intervention(s): Limited activity within patient's tolerance;Monitored during session;Repositioned    Home Living                      Prior Function  PT Goals (current goals can now be found in the care plan section) Acute Rehab PT Goals Patient Stated Goal: get better PT Goal Formulation: With patient Time For Goal Achievement: 07/18/14 Potential to Achieve Goals: Good Progress towards PT goals: Progressing  toward goals    Frequency  7X/week    PT Plan Current plan remains appropriate    Co-evaluation             End of Session Equipment Utilized During Treatment: Gait belt Activity Tolerance: Patient limited by fatigue Patient left: in chair;with call bell/phone within reach;with family/visitor present     Time: 1039-1105 PT Time Calculation (min) (ACUTE ONLY): 26 min  Charges:  $Gait Training: 8-22 mins $Therapeutic Exercise: 8-22 mins                    G Codes:      Michail Jewels PT, DPT [phone] Pager: [phone] 07/12/2014, 11:15 AM

## 2014-07-12 NOTE — Progress Notes (Signed)
NT called about pt temp of 102.9 oral. Pt assessed. No apparent signs of distress. VSS taken. Pt states," I just woke up and I have pain in my leg". Pt given 650mg  of PO tylenol, 500mg  of robaxin, and 10mg  of percocet. Will contiune to monitor patient. Took VSS when leaving patient room and it was 100.3 oral. Encouraged incentive spirometer.    5:40am: checked rectal temp and it was 103.29F. Paged on call MD: Janee Morn; No new orders received. Told to communicate to oncoming shift. Will continue to monitor patient.

## 2015-04-12 DIAGNOSIS — M79601 Pain in right arm: Secondary | ICD-10-CM | POA: Insufficient documentation

## 2015-04-12 HISTORY — DX: Pain in right arm: M79.601

## 2015-10-17 ENCOUNTER — Other Ambulatory Visit: Payer: Self-pay | Admitting: Orthopedic Surgery

## 2015-10-18 ENCOUNTER — Encounter (HOSPITAL_BASED_OUTPATIENT_CLINIC_OR_DEPARTMENT_OTHER): Payer: Self-pay | Admitting: *Deleted

## 2015-10-19 NOTE — H&P (Signed)
[name] is an 55 y.o. female.   CC / Reason for Visit: Left trigger thumb HPI: This patient returns to [place] today indicating that her left thumb is triggering and she would like to be scheduled for a left trigger finger release.    HPI 04/30/2015:This patient returns to [place] today indicating that she continues to have shooting sensations from her neck down into her right arm and thumb.  She indicates that she never received a referral as of yet in order to follow up with Dr. Regino Schultze for further evaluation and treatment of cervical radiculopathy-type symptoms.  She arrives here today indicating that she had a nerve conduction study by her pain management physician, however, the sensations that she's feeling in her right upper extremity have not yet been treated with the exception of Lyrica.  HPI 01/02/2016:This patient returns for reevaluation indicating that her right thumb is doing very well as is her elbow.  Her left trigger thumb is significantly better but not all together well. It no longer pops, clicks, or gets stuck but it is still a little painful.  She also reports that she has some numbness and tingling that travels down her right arm with different neck movements and has a history of cervical spine problems.  Past Medical History:  Diagnosis Date  . Anxiety   . Asthma   . Chronic pain    lower back  . COPD (chronic obstructive pulmonary disease) (HCC)   . DDD (degenerative disc disease), cervical   . DDD (degenerative disc disease), lumbar   . Depression   . Fibromyalgia   . Full dentures   . High cholesterol   . Hypertension    states no HTN, is on med. to protect kidneys due to diabetes  . Hypothyroid   . IBS (irritable bowel syndrome)    no current med.  . Insulin dependent diabetes mellitus (HCC)   . Lumbar nerve root impingement   . Neuropathy (HCC)    hands and feet  . Osteoarthritis   . Sleep apnea    uses CPAP nightly  . Trigger thumb of left hand 09/2015     Past Surgical History:  Procedure Laterality Date  . ABDOMINAL HYSTERECTOMY  2006   partial  . BLADDER SUSPENSION    . CARPAL TUNNEL RELEASE Right 07/22/2001  . CARPAL TUNNEL RELEASE Left 01/05/2003  . CHOLECYSTECTOMY    . COLONOSCOPY W/ POLYPECTOMY    . LUMBAR DISC SURGERY    . MULTIPLE EXTRACTIONS WITH ALVEOLOPLASTY N/A 11/26/2012   Procedure: MULTIPLE EXTRACTION WITH ALVEOLOPLASTY;  Surgeon: [name], DDS;  Location: [facility] OR;  Service: Oral Surgery;  Laterality: N/A;  . RECTAL PROLAPSE REPAIR    . TOTAL HIP ARTHROPLASTY Left 07/10/2014   Procedure: TOTAL HIP ARTHROPLASTY ANTERIOR APPROACH;  Surgeon: Jodi Geralds, MD;  Location: [facility] OR;  Service: Orthopedics;  Laterality: Left;  . TRIGGER FINGER RELEASE Right    thumb  . TUBAL LIGATION    . ULNAR NERVE TRANSPOSITION Right 03/27/2014   Procedure: RIGHT ULNAR NEUROPLASTY AT THE ELBOW;  Surgeon: Jodi Marble, MD;  Location: [facility];  Service: Orthopedics;  Laterality: Right;  . UPPER GASTROINTESTINAL ENDOSCOPY      History reviewed. No pertinent family history. Social History:  reports that she quit smoking about 7 years ago. She has never used smokeless tobacco. She reports that she does not drink alcohol or use drugs.  Allergies:  Allergies  Allergen Reactions  . Adhesive [Tape] Hives  .  Latex Hives    No prescriptions prior to admission.    No results found for this or any previous visit (from the past 48 hour(s)). No results found.  Review of Systems  All other systems reviewed and are negative.   Height 5\' 6"  (1.676 m), weight 95.3 kg (210 lb). Physical Exam  Constitutional:  WD, WN, NAD HEENT:  NCAT, EOMI Neuro/Psych:  Alert & oriented to person, place, and time; appropriate mood & affect Lymphatic: No generalized UE edema or lymphadenopathy Extremities / MSK:  Both UE are normal with respect to appearance, ranges of motion, joint stability, muscle strength/tone, sensation, & perfusion  except as otherwise noted:  The patient's left thumb catches with each flexion cycle.  There is pain and swelling about the IP joint as well as with palpation over the A1 pulley.  NVI.  Labs / X-rays:  No radiographic studies obtained today.  Assessment:  1. Status post right trigger thumb release; ulnar nerve neuroplasty 2. Left trigger thumb 3. Cervical radiculopathy  Plan: The findings were discussed the patient wishes to proceed with left trigger thumb release. The details of the operative procedure were discussed with the patient.  Questions were invited and answered.  In addition to the goal of the procedure, the risks of the procedure to include but not limited to bleeding; infection; damage to the nerves or blood vessels that could result in bleeding, numbness, weakness, chronic pain, and the need for additional procedures; stiffness; the need for revision surgery; and anesthetic risks were reviewed.  No specific outcome was guaranteed or implied.  Informed consent was obtained.   Johanna Matto A., MD 10/19/2015, 4:14 PM

## 2015-10-22 ENCOUNTER — Encounter (HOSPITAL_BASED_OUTPATIENT_CLINIC_OR_DEPARTMENT_OTHER)
Admission: RE | Disposition: A | Discharge status: Home / Self Care | Payer: Self-pay | Source: Ambulatory Visit | Attending: Orthopedic Surgery

## 2015-10-22 ENCOUNTER — Encounter (HOSPITAL_BASED_OUTPATIENT_CLINIC_OR_DEPARTMENT_OTHER): Payer: Self-pay

## 2015-10-22 ENCOUNTER — Ambulatory Visit (HOSPITAL_BASED_OUTPATIENT_CLINIC_OR_DEPARTMENT_OTHER): Payer: Medicaid Other | Admitting: Anesthesiology

## 2015-10-22 ENCOUNTER — Ambulatory Visit (HOSPITAL_BASED_OUTPATIENT_CLINIC_OR_DEPARTMENT_OTHER)
Admission: RE | Admit: 2015-10-22 | Discharge: 2015-10-22 | Disposition: A | Discharge status: Home / Self Care | Payer: Medicaid Other | Source: Ambulatory Visit | Attending: Orthopedic Surgery | Admitting: Orthopedic Surgery

## 2015-10-22 DIAGNOSIS — Z794 Long term (current) use of insulin: Secondary | ICD-10-CM | POA: Insufficient documentation

## 2015-10-22 DIAGNOSIS — G4730 Sleep apnea, unspecified: Secondary | ICD-10-CM | POA: Insufficient documentation

## 2015-10-22 DIAGNOSIS — J449 Chronic obstructive pulmonary disease, unspecified: Secondary | ICD-10-CM | POA: Insufficient documentation

## 2015-10-22 DIAGNOSIS — F329 Major depressive disorder, single episode, unspecified: Secondary | ICD-10-CM | POA: Insufficient documentation

## 2015-10-22 DIAGNOSIS — M65312 Trigger thumb, left thumb: Secondary | ICD-10-CM | POA: Insufficient documentation

## 2015-10-22 DIAGNOSIS — Z96642 Presence of left artificial hip joint: Secondary | ICD-10-CM | POA: Insufficient documentation

## 2015-10-22 DIAGNOSIS — M797 Fibromyalgia: Secondary | ICD-10-CM | POA: Diagnosis not present

## 2015-10-22 DIAGNOSIS — Z79899 Other long term (current) drug therapy: Secondary | ICD-10-CM | POA: Insufficient documentation

## 2015-10-22 DIAGNOSIS — Z87891 Personal history of nicotine dependence: Secondary | ICD-10-CM | POA: Insufficient documentation

## 2015-10-22 DIAGNOSIS — E039 Hypothyroidism, unspecified: Secondary | ICD-10-CM | POA: Diagnosis not present

## 2015-10-22 DIAGNOSIS — Z6838 Body mass index (BMI) 38.0-38.9, adult: Secondary | ICD-10-CM | POA: Diagnosis not present

## 2015-10-22 DIAGNOSIS — I10 Essential (primary) hypertension: Secondary | ICD-10-CM | POA: Insufficient documentation

## 2015-10-22 DIAGNOSIS — E7800 Pure hypercholesterolemia, unspecified: Secondary | ICD-10-CM | POA: Insufficient documentation

## 2015-10-22 DIAGNOSIS — E1140 Type 2 diabetes mellitus with diabetic neuropathy, unspecified: Secondary | ICD-10-CM | POA: Diagnosis not present

## 2015-10-22 DIAGNOSIS — Z79891 Long term (current) use of opiate analgesic: Secondary | ICD-10-CM | POA: Insufficient documentation

## 2015-10-22 HISTORY — DX: Trigger thumb, left thumb: M65.312

## 2015-10-22 HISTORY — DX: Radiculopathy, lumbar region: M54.16

## 2015-10-22 HISTORY — DX: Pure hypercholesterolemia, unspecified: E78.00

## 2015-10-22 HISTORY — PX: TRIGGER FINGER RELEASE: SHX641

## 2015-10-22 HISTORY — DX: Essential (primary) hypertension: I10

## 2015-10-22 HISTORY — DX: Unspecified osteoarthritis, unspecified site: M19.90

## 2015-10-22 LAB — POCT I-STAT, CHEM 8
BUN: 10 mg/dL (ref 6–20)
CHLORIDE: 102 mmol/L (ref 101–111)
Calcium, Ion: 1.24 mmol/L (ref 1.15–1.40)
Creatinine, Ser: 0.7 mg/dL (ref 0.44–1.00)
GLUCOSE: 182 mg/dL — AB (ref 65–99)
HCT: 44 % (ref 36.0–46.0)
HEMOGLOBIN: 15 g/dL (ref 12.0–15.0)
Potassium: 3.9 mmol/L (ref 3.5–5.1)
SODIUM: 143 mmol/L (ref 135–145)
TCO2: 26 mmol/L (ref 0–100)

## 2015-10-22 LAB — GLUCOSE, CAPILLARY: GLUCOSE-CAPILLARY: 165 mg/dL — AB (ref 65–99)

## 2015-10-22 SURGERY — RELEASE, A1 PULLEY, FOR TRIGGER FINGER
Anesthesia: Monitor Anesthesia Care | Site: Thumb | Laterality: Left

## 2015-10-22 MED ORDER — GLYCOPYRROLATE 0.2 MG/ML IJ SOLN: 0.2000 mg | Freq: Once | INTRAMUSCULAR | Status: DC | PRN

## 2015-10-22 MED ORDER — CEFAZOLIN SODIUM-DEXTROSE 2-4 GM/100ML-% IV SOLN
2.0000 g | INTRAVENOUS | Status: AC
  Administered 2015-10-22: 2 g via INTRAVENOUS

## 2015-10-22 MED ORDER — FENTANYL CITRATE (PF) 100 MCG/2ML IJ SOLN
50.0000 ug | INTRAMUSCULAR | Status: DC | PRN
  Administered 2015-10-22 (×2): 50 ug via INTRAVENOUS

## 2015-10-22 MED ORDER — 0.9 % SODIUM CHLORIDE (POUR BTL) OPTIME
TOPICAL | Status: DC | PRN
  Administered 2015-10-22: 200 mL

## 2015-10-22 MED ORDER — FENTANYL CITRATE (PF) 100 MCG/2ML IJ SOLN: 25.0000 ug | INTRAMUSCULAR | Status: DC | PRN

## 2015-10-22 MED ORDER — MIDAZOLAM HCL 2 MG/2ML IJ SOLN
1.0000 mg | INTRAMUSCULAR | Status: DC | PRN
  Administered 2015-10-22: 1 mg via INTRAVENOUS

## 2015-10-22 MED ORDER — LACTATED RINGERS IV SOLN
INTRAVENOUS | Status: DC
  Administered 2015-10-22: 10:00:00 via INTRAVENOUS

## 2015-10-22 MED ORDER — CEFAZOLIN SODIUM-DEXTROSE 2-4 GM/100ML-% IV SOLN
INTRAVENOUS | Status: AC
  Filled 2015-10-22: qty 100

## 2015-10-22 MED ORDER — MIDAZOLAM HCL 2 MG/2ML IJ SOLN
INTRAMUSCULAR | Status: AC
  Filled 2015-10-22: qty 2

## 2015-10-22 MED ORDER — PROPOFOL 500 MG/50ML IV EMUL
INTRAVENOUS | Status: DC | PRN
  Administered 2015-10-22: 100 ug/kg/min via INTRAVENOUS

## 2015-10-22 MED ORDER — HYDROCODONE-ACETAMINOPHEN 10-325 MG PO TABS: 1.0000 | ORAL_TABLET | Freq: Four times a day (QID) | ORAL | 0 refills | Status: DC | PRN

## 2015-10-22 MED ORDER — OXYCODONE HCL 5 MG PO TABS
ORAL_TABLET | ORAL | Status: AC
  Filled 2015-10-22: qty 1

## 2015-10-22 MED ORDER — LIDOCAINE HCL 2 % IJ SOLN
INTRAMUSCULAR | Status: DC | PRN
  Administered 2015-10-22: 1.5 mL

## 2015-10-22 MED ORDER — SCOPOLAMINE 1 MG/3DAYS TD PT72: 1.0000 | MEDICATED_PATCH | Freq: Once | TRANSDERMAL | Status: DC | PRN

## 2015-10-22 MED ORDER — OXYCODONE HCL 5 MG PO TABS
5.0000 mg | ORAL_TABLET | Freq: Once | ORAL | Status: AC
  Administered 2015-10-22: 5 mg via ORAL

## 2015-10-22 MED ORDER — MEPERIDINE HCL 25 MG/ML IJ SOLN: 6.2500 mg | INTRAMUSCULAR | Status: DC | PRN

## 2015-10-22 MED ORDER — PROPOFOL 500 MG/50ML IV EMUL
INTRAVENOUS | Status: AC
  Filled 2015-10-22: qty 50

## 2015-10-22 MED ORDER — FENTANYL CITRATE (PF) 100 MCG/2ML IJ SOLN
INTRAMUSCULAR | Status: AC
  Filled 2015-10-22: qty 2

## 2015-10-22 MED ORDER — BUPIVACAINE-EPINEPHRINE (PF) 0.25% -1:200000 IJ SOLN
INTRAMUSCULAR | Status: DC | PRN
Start: 2015-10-22 — End: 2015-10-22
  Administered 2015-10-22: 1.5 mL via PERINEURAL

## 2015-10-22 MED ORDER — ONDANSETRON HCL 4 MG/2ML IJ SOLN
INTRAMUSCULAR | Status: AC
  Filled 2015-10-22: qty 2

## 2015-10-22 MED ORDER — LACTATED RINGERS IV SOLN
INTRAVENOUS | Status: DC
  Administered 2015-10-22: 09:00:00 via INTRAVENOUS

## 2015-10-22 SURGICAL SUPPLY — 38 items
BLADE SURG 15 STRL LF DISP TIS (BLADE) ×1 IMPLANT
BLADE SURG 15 STRL SS (BLADE) ×3
BNDG CMPR 9X4 STRL LF SNTH (GAUZE/BANDAGES/DRESSINGS) ×1
BNDG COHESIVE 1X5 TAN STRL LF (GAUZE/BANDAGES/DRESSINGS) ×3 IMPLANT
BNDG CONFORM 2 STRL LF (GAUZE/BANDAGES/DRESSINGS) ×3 IMPLANT
BNDG ESMARK 4X9 LF (GAUZE/BANDAGES/DRESSINGS) ×3 IMPLANT
BNDG GAUZE ELAST 4 BULKY (GAUZE/BANDAGES/DRESSINGS) ×3 IMPLANT
CHLORAPREP W/TINT 26ML (MISCELLANEOUS) ×3 IMPLANT
COVER BACK TABLE 60X90IN (DRAPES) ×3 IMPLANT
COVER MAYO STAND STRL (DRAPES) ×3 IMPLANT
CUFF TOURNIQUET SINGLE 18IN (TOURNIQUET CUFF) IMPLANT
DRAPE EXTREMITY T 121X128X90 (DRAPE) ×3 IMPLANT
DRAPE SURG 17X23 STRL (DRAPES) ×3 IMPLANT
DRSG EMULSION OIL 3X3 NADH (GAUZE/BANDAGES/DRESSINGS) ×3 IMPLANT
GLOVE BIO SURGEON STRL SZ7.5 (GLOVE) ×3 IMPLANT
GLOVE BIOGEL PI IND STRL 7.0 (GLOVE) ×1 IMPLANT
GLOVE BIOGEL PI IND STRL 8 (GLOVE) ×1 IMPLANT
GLOVE BIOGEL PI INDICATOR 7.0 (GLOVE) ×4
GLOVE BIOGEL PI INDICATOR 8 (GLOVE) ×2
GLOVE ECLIPSE 6.5 STRL STRAW (GLOVE) ×3 IMPLANT
GOWN STRL REUS W/ TWL LRG LVL3 (GOWN DISPOSABLE) ×2 IMPLANT
GOWN STRL REUS W/TWL LRG LVL3 (GOWN DISPOSABLE) ×6
GOWN STRL REUS W/TWL XL LVL3 (GOWN DISPOSABLE) ×3 IMPLANT
NDL HYPO 25X1 1.5 SAFETY (NEEDLE) IMPLANT
NDL SAFETY ECLIPSE 18X1.5 (NEEDLE) IMPLANT
NEEDLE HYPO 18GX1.5 SHARP (NEEDLE)
NEEDLE HYPO 25X1 1.5 SAFETY (NEEDLE) ×3 IMPLANT
NS IRRIG 1000ML POUR BTL (IV SOLUTION) ×3 IMPLANT
PACK BASIN DAY SURGERY FS (CUSTOM PROCEDURE TRAY) ×3 IMPLANT
SPONGE GAUZE 4X4 12PLY STER LF (GAUZE/BANDAGES/DRESSINGS) ×3 IMPLANT
STOCKINETTE 6  STRL (DRAPES) ×2
STOCKINETTE 6 STRL (DRAPES) ×1 IMPLANT
SUT VICRYL RAPIDE 4/0 PS 2 (SUTURE) IMPLANT
SYR BULB 3OZ (MISCELLANEOUS) ×3 IMPLANT
SYRINGE 10CC LL (SYRINGE) ×2 IMPLANT
TOWEL OR 17X24 6PK STRL BLUE (TOWEL DISPOSABLE) ×3 IMPLANT
TOWEL OR NON WOVEN STRL DISP B (DISPOSABLE) IMPLANT
UNDERPAD 30X30 (UNDERPADS AND DIAPERS) ×3 IMPLANT

## 2015-10-22 NOTE — Anesthesia Preprocedure Evaluation (Signed)
Anesthesia Evaluation  Patient identified by MRN, date of birth, ID band Patient awake    Reviewed: Allergy & Precautions, NPO status , Patient's Chart, lab work & pertinent test results  Airway Mallampati: I  TM Distance: >3 FB Neck ROM: Full    Dental  (+) Teeth Intact, Dental Advisory Given   Pulmonary COPD,  COPD inhaler, former smoker,    breath sounds clear to auscultation       Cardiovascular hypertension, Pt. on medications  Rhythm:Regular Rate:Normal     Neuro/Psych    GI/Hepatic   Endo/Other  diabetes, Well Controlled, Type 2, Insulin Dependent, Oral Hypoglycemic AgentsMorbid obesity  Renal/GU      Musculoskeletal   Abdominal   Peds  Hematology   Anesthesia Other Findings   Reproductive/Obstetrics                             Anesthesia Physical Anesthesia Plan  ASA: III  Anesthesia Plan: MAC   Post-op Pain Management:    Induction: Intravenous  Airway Management Planned: Simple Face Mask  Additional Equipment:   Intra-op Plan:   Post-operative Plan:   Informed Consent: I have reviewed the patients History and Physical, chart, labs and discussed the procedure including the risks, benefits and alternatives for the proposed anesthesia with the patient or authorized representative who has indicated his/her understanding and acceptance.   Dental advisory given  Plan Discussed with: CRNA, Anesthesiologist and Surgeon  Anesthesia Plan Comments:         Anesthesia Quick Evaluation

## 2015-10-22 NOTE — Interval H&P Note (Signed)
History and Physical Interval Note:  10/22/2015 10:07 AM  [name]  has presented today for surgery, with the diagnosis of LEFT TRIGGER THUMB M65.312  The various methods of treatment have been discussed with the patient and family. After consideration of risks, benefits and other options for treatment, the patient has consented to  Procedure(s): LEFT TRIGGER THUMB RELEASE (Left) as a surgical intervention .  The patient's history has been reviewed, patient examined, no change in status, stable for surgery.  I have reviewed the patient's chart and labs.  Questions were answered to the patient's satisfaction.     Shatisha Falter A.

## 2015-10-22 NOTE — Op Note (Signed)
10/22/2015  10:07 AM  PATIENT:  [name]  [age] y.o. female  PRE-OPERATIVE DIAGNOSIS:  Left trigger thumb   POST-OPERATIVE DIAGNOSIS:  Same  PROCEDURE:  Left trigger thumb release  SURGEON: Cliffton Astersavid A. Janee Mornhompson, MD  PHYSICIAN ASSISTANT: Danielle RankinKirsten Schrader, OPA-C  ANESTHESIA:  local and MAC  SPECIMENS:  None  DRAINS:   None  EBL:  less than 50 mL  PREOPERATIVE INDICATIONS:  [name] is a  55 y.o. female with a left trigger thumb that persists despite non-operative measures, including steroid injection.  The risks benefits and alternatives were discussed with the patient preoperatively including but not limited to the risks of infection, bleeding, nerve injury, cardiopulmonary complications, the need for revision surgery, among others, and the patient verbalized understanding and consented to proceed.  OPERATIVE IMPLANTS: none  OPERATIVE PROCEDURE:  After receiving prophylactic antibiotics, the patient was escorted to the operative theatre and placed in a supine position.  A surgical "time-out" was performed during which the planned procedure, proposed operative site, and the correct patient identity were compared to the operative consent and agreement confirmed by the circulating nurse according to current facility policy.  Following application of a tourniquet to the operative extremity, the exposed skin was prepped with Chloraprep and draped in the usual sterile fashion.  The limb was exsanguinated with an Esmarch bandage and the tourniquet inflated to approximately 100mmHg higher than systolic BP.  A transverse incision was made at the base of the left thumb over the A1 pulley.  The skin was incised sharply with a scalpel, subcutaneous tissues dissected with blunt spreading dissection retracting the neurovascular bundles radially and ulnarly.  The A1 pulley was then divided and split down the midline with care to preserve the more distal oblique pulley.   The wall of the pulley was quite thickened and some of it was actually excised.  The tendon was then free, brought out into view and found to have longitudinal split tearing within it that was also further debrided.  It was returned to its bed, the wound irrigated and the tourniquet released.  The skin was closed with 4-0 Vicryl Rapide interrupted sutures and a light dressing was applied.  She was taken to recovery room.  DISPOSITION: She'll be discharged home to typical instructions, returning in 10-15 days.

## 2015-10-22 NOTE — Discharge Instructions (Signed)
Discharge Instructions ° ° °You have a light dressing on your hand.  °You may begin gentle motion of your fingers and hand immediately, but you should not do any heavy lifting or gripping.  Elevate your hand to reduce pain & swelling of the digits.  Ice over the operative site may be helpful to reduce pain & swelling.  DO NOT USE HEAT. °Pain medicine has been prescribed for you.  °Use your medicine as needed over the first 48 hours, and then you can begin to taper your use. You may use Tylenol in place of your prescribed pain medication, but not IN ADDITION to it. °Leave the dressing in place until the third day after your surgery and then remove it, leaving it open to air.  °After the bandage has been removed you may shower, regularly washing the incision and letting the water run over it, but not submerging it (no swimming, soaking it in dishwater, etc.) °You may drive a car when you are off of prescription pain medications and can safely control your vehicle with both hands. °We will address whether therapy will be required or not when you return to the office. °You may have already made your follow-up appointment when we completed your preop visit.  If not, please call our office today or the next business day to make your return appointment for 10-15 days after surgery. ° ° °Please call [phone] during normal business hours or [phone] after hours for any problems. Including the following: ° °- excessive redness of the incisions °- drainage for more than 4 days °- fever of more than 101.5 F ° °*Please note that pain medications will not be refilled after hours or on weekends. °Post Anesthesia Home Care Instructions ° °Activity: °Get plenty of rest for the remainder of the day. A responsible adult should stay with you for 24 hours following the procedure.  °For the next 24 hours, DO NOT: °-Drive a car °-Operate machinery °-Drink alcoholic beverages °-Take any medication unless instructed by your  physician °-Make any legal decisions or sign important papers. ° °Meals: °Start with liquid foods such as gelatin or soup. Progress to regular foods as tolerated. Avoid greasy, spicy, heavy foods. If nausea and/or vomiting occur, drink only clear liquids until the nausea and/or vomiting subsides. Call your physician if vomiting continues. ° °Special Instructions/Symptoms: °Your throat may feel dry or sore from the anesthesia or the breathing tube placed in your throat during surgery. If this causes discomfort, gargle with warm salt water. The discomfort should disappear within 24 hours. ° °If you had a scopolamine patch placed behind your ear for the management of post- operative nausea and/or vomiting: ° °1. The medication in the patch is effective for 72 hours, after which it should be removed.  Wrap patch in a tissue and discard in the trash. Wash hands thoroughly with soap and water. °2. You may remove the patch earlier than 72 hours if you experience unpleasant side effects which may include dry mouth, dizziness or visual disturbances. °3. Avoid touching the patch. Wash your hands with soap and water after contact with the patch. °  ° °

## 2015-10-22 NOTE — Anesthesia Postprocedure Evaluation (Signed)
Anesthesia Post Note  Patient: [name]  Procedure(s) Performed: Procedure(s) (LRB): LEFT TRIGGER THUMB RELEASE (Left)  Patient location during evaluation: PACU Anesthesia Type: MAC Level of consciousness: awake and alert Pain management: pain level controlled Vital Signs Assessment: post-procedure vital signs reviewed and stable Respiratory status: spontaneous breathing, nonlabored ventilation and respiratory function stable Cardiovascular status: stable and blood pressure returned to baseline Anesthetic complications: no    Last Vitals:  Vitals:   10/22/15 1050 10/22/15 1111  BP:  116/81  Pulse: 83 78  Resp: 15 18  Temp:  36.7 C    Last Pain:  Vitals:   10/22/15 0830  TempSrc: Oral  PainSc: 5                  Shrika Milos A

## 2015-10-22 NOTE — Transfer of Care (Signed)
Immediate Anesthesia Transfer of Care Note  Patient: [name]  Procedure(s) Performed: Procedure(s): LEFT TRIGGER THUMB RELEASE (Left)  Patient Location: PACU  Anesthesia Type:MAC  Level of Consciousness: awake, alert  and oriented  Airway & Oxygen Therapy: Patient Spontanous Breathing  Post-op Assessment: Report given to RN and Post -op Vital signs reviewed and stable  Post vital signs: Reviewed and stable  Last Vitals:  Vitals:   10/22/15 0830  BP: 115/71  Pulse: 80  Resp: 18  Temp: 36.8 C    Last Pain:  Vitals:   10/22/15 0830  TempSrc: Oral  PainSc: 5       Patients Stated Pain Goal: 2 (10/22/15 0830)  Complications: No apparent anesthesia complications

## 2015-10-23 ENCOUNTER — Encounter (HOSPITAL_BASED_OUTPATIENT_CLINIC_OR_DEPARTMENT_OTHER): Payer: Self-pay | Admitting: Orthopedic Surgery

## 2016-01-28 HISTORY — PX: TRIGGER FINGER RELEASE: SHX641

## 2016-04-02 DIAGNOSIS — G2581 Restless legs syndrome: Secondary | ICD-10-CM

## 2016-04-02 HISTORY — DX: Restless legs syndrome: G25.81

## 2016-05-06 DIAGNOSIS — E1142 Type 2 diabetes mellitus with diabetic polyneuropathy: Secondary | ICD-10-CM

## 2016-05-06 DIAGNOSIS — M7741 Metatarsalgia, right foot: Secondary | ICD-10-CM

## 2016-05-06 DIAGNOSIS — M2042 Other hammer toe(s) (acquired), left foot: Secondary | ICD-10-CM

## 2016-05-06 HISTORY — DX: Other hammer toe(s) (acquired), left foot: M20.42

## 2016-05-06 HISTORY — DX: Metatarsalgia, right foot: M77.41

## 2016-05-06 HISTORY — DX: Type 2 diabetes mellitus with diabetic polyneuropathy: E11.42

## 2016-06-27 ENCOUNTER — Other Ambulatory Visit: Payer: Self-pay | Admitting: Orthopedic Surgery

## 2016-06-30 ENCOUNTER — Other Ambulatory Visit: Payer: Self-pay | Admitting: Orthopedic Surgery

## 2016-06-30 DIAGNOSIS — M4800 Spinal stenosis, site unspecified: Secondary | ICD-10-CM

## 2016-07-12 ENCOUNTER — Ambulatory Visit
Admission: RE | Admit: 2016-07-12 | Discharge: 2016-07-12 | Disposition: A | Discharge status: Home / Self Care | Payer: Medicaid Other | Source: Ambulatory Visit | Attending: Orthopedic Surgery | Admitting: Orthopedic Surgery

## 2016-07-12 DIAGNOSIS — M4800 Spinal stenosis, site unspecified: Secondary | ICD-10-CM

## 2016-11-20 ENCOUNTER — Other Ambulatory Visit: Payer: Self-pay | Admitting: Orthopedic Surgery

## 2016-12-02 ENCOUNTER — Encounter (HOSPITAL_BASED_OUTPATIENT_CLINIC_OR_DEPARTMENT_OTHER): Payer: Self-pay | Admitting: *Deleted

## 2016-12-02 ENCOUNTER — Other Ambulatory Visit: Payer: Self-pay

## 2016-12-02 NOTE — Progress Notes (Signed)
   12/02/16 1129  OBSTRUCTIVE SLEEP APNEA  Have you ever been diagnosed with sleep apnea through a sleep study? Yes  If yes, do you have and use a CPAP or BPAP machine every night? 1  Do you know the presssure settings on your maching? Yes  Do you snore loudly (loud enough to be heard through closed doors)?  1  Do you often feel tired, fatigued, or sleepy during the daytime (such as falling asleep during driving or talking to someone)? 0  Has anyone observed you stop breathing during your sleep? 0  Do you have, or are you being treated for high blood pressure? 1  BMI more than 35 kg/m2? 1  Age > 50 (1-yes) 1  Neck circumference greater than:Female 16 inches or larger, Female 17inches or larger? 0  Female Gender (Yes=1) 0  Obstructive Sleep Apnea Score 4

## 2016-12-05 ENCOUNTER — Encounter (HOSPITAL_BASED_OUTPATIENT_CLINIC_OR_DEPARTMENT_OTHER)
Admission: RE | Admit: 2016-12-05 | Discharge: 2016-12-05 | Disposition: A | Discharge status: Home / Self Care | Payer: Medicaid Other | Source: Ambulatory Visit | Attending: Orthopedic Surgery | Admitting: Orthopedic Surgery

## 2016-12-05 DIAGNOSIS — Z01818 Encounter for other preprocedural examination: Secondary | ICD-10-CM | POA: Insufficient documentation

## 2016-12-05 DIAGNOSIS — G5622 Lesion of ulnar nerve, left upper limb: Secondary | ICD-10-CM | POA: Diagnosis not present

## 2016-12-05 LAB — BASIC METABOLIC PANEL
ANION GAP: 10 (ref 5–15)
BUN: 9 mg/dL (ref 6–20)
CO2: 23 mmol/L (ref 22–32)
CREATININE: 0.88 mg/dL (ref 0.44–1.00)
Calcium: 8.9 mg/dL (ref 8.9–10.3)
Chloride: 104 mmol/L (ref 101–111)
GFR calc non Af Amer: >60 mL/min (ref >60)
Glucose, Bld: 226 mg/dL — ABNORMAL HIGH (ref 65–99)
POTASSIUM: 4.5 mmol/L (ref 3.5–5.1)
SODIUM: 137 mmol/L (ref 135–145)

## 2016-12-05 NOTE — Progress Notes (Signed)
Reported labs to Dr Sandford Craze. [place].  Glucose 226 today. Called pt. And instructed to watch sugary intake this weekend prior to surgery Monday am.  Pt verbalized understanding.

## 2016-12-07 NOTE — H&P (Signed)
Lori CitizenKimberly [name] is an 56 y.o. female.   CC / Reason for Visit: Left hand pain and numbness HPI: This patient is a 56 -year-old female, well known to me, who presents for evaluation of her left hand.  She underwent right ulnar nerve decompression at the elbow 2 and half years ago.  She reports that she has developed progressively worsening symptoms on the left side, similar to what they were on the right side, over the past 1-1/2 years, but really worse over the past month or 2.  She does report that she takes Lyrica, 3 times daily and did have some nerve studies that she thinks was fairly recent but honestly has lost track of when they were performed.  She reports that her primary care provider would have the studies.  She does have a remote history of a left open carpal tunnel release.  Past Medical History:  Diagnosis Date  . Anxiety   . Asthma   . Chronic pain    lower back  . COPD (chronic obstructive pulmonary disease) (HCC)   . DDD (degenerative disc disease), cervical   . DDD (degenerative disc disease), lumbar   . Depression   . Fibromyalgia   . Full dentures   . High cholesterol   . Hypertension    states no HTN, is on med. to protect kidneys due to diabetes  . Hypothyroid   . IBS (irritable bowel syndrome)    no current med.  . Insulin dependent diabetes mellitus (HCC)   . Lumbar nerve root impingement   . Neuropathy    hands and feet  . Osteoarthritis   . Sleep apnea    uses CPAP nightly  . Trigger thumb of left hand 09/2015    Past Surgical History:  Procedure Laterality Date  . ABDOMINAL HYSTERECTOMY  2006   partial  . BLADDER SUSPENSION    . CARPAL TUNNEL RELEASE Right 07/22/2001  . CARPAL TUNNEL RELEASE Left 01/05/2003  . CHOLECYSTECTOMY    . COLONOSCOPY W/ POLYPECTOMY    . LUMBAR DISC SURGERY    . RECTAL PROLAPSE REPAIR    . TRIGGER FINGER RELEASE Right    thumb  . TUBAL LIGATION    . UPPER GASTROINTESTINAL ENDOSCOPY      History reviewed.  No pertinent family history. Social History:  reports that she quit smoking about 9 years ago. she has never used smokeless tobacco. She reports that she does not drink alcohol or use drugs.  Allergies:  Allergies  Allergen Reactions  . Adhesive [Tape] Hives  . Latex Hives    No medications prior to admission.    No results found for this or any previous visit (from the past 48 hour(s)). No results found.  Review of Systems  All other systems reviewed and are negative.   Height 5\' 6"  (1.676 m), weight 111.1 kg (245 lb). Physical Exam  Constitutional:  WD, WN, NAD HEENT:  NCAT, EOMI Neuro/Psych:  Alert & oriented to person, place, and time; appropriate mood & affect Lymphatic: No generalized UE edema or lymphadenopathy Extremities / MSK:  Both UE are normal with respect to appearance, ranges of motion, joint stability, muscle strength/tone, sensation, & perfusion except as otherwise noted:  Left hand has altered sensibility to light touch on the ulnar border of the hand mostly volarly not so much dorsally, with appropriate ring finger splitting.  In addition, monofilament testing is 2.83 in the median distribution, 3.61 in the ulnar to include appropriate ring finger splitting.  There is a Tinel sign over the ulnar nerve at the cubital tunnel, and increase in symptoms with elbow flexion testing.  Labs / Xrays:  No radiographic studies obtained today.  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  We will contact her primary care provider's office in order to try to obtain those studies and, once I review them, I will call her regarding how we should proceed.  Jaivian Battaglini A., MD 12/07/2016, 11:40 PM

## 2016-12-08 ENCOUNTER — Encounter (HOSPITAL_BASED_OUTPATIENT_CLINIC_OR_DEPARTMENT_OTHER): Payer: Self-pay | Admitting: Certified Registered"

## 2016-12-08 ENCOUNTER — Ambulatory Visit (HOSPITAL_BASED_OUTPATIENT_CLINIC_OR_DEPARTMENT_OTHER)
Admission: RE | Admit: 2016-12-08 | Discharge: 2016-12-08 | Disposition: A | Discharge status: Home / Self Care | Payer: Medicaid Other | Source: Ambulatory Visit | Attending: Orthopedic Surgery | Admitting: Orthopedic Surgery

## 2016-12-08 ENCOUNTER — Ambulatory Visit (HOSPITAL_BASED_OUTPATIENT_CLINIC_OR_DEPARTMENT_OTHER): Payer: Medicaid Other | Admitting: Certified Registered"

## 2016-12-08 ENCOUNTER — Other Ambulatory Visit: Payer: Self-pay

## 2016-12-08 ENCOUNTER — Encounter (HOSPITAL_BASED_OUTPATIENT_CLINIC_OR_DEPARTMENT_OTHER)
Admission: RE | Disposition: A | Discharge status: Home / Self Care | Payer: Self-pay | Source: Ambulatory Visit | Attending: Orthopedic Surgery

## 2016-12-08 DIAGNOSIS — M5136 Other intervertebral disc degeneration, lumbar region: Secondary | ICD-10-CM | POA: Insufficient documentation

## 2016-12-08 DIAGNOSIS — G4730 Sleep apnea, unspecified: Secondary | ICD-10-CM | POA: Insufficient documentation

## 2016-12-08 DIAGNOSIS — E1140 Type 2 diabetes mellitus with diabetic neuropathy, unspecified: Secondary | ICD-10-CM | POA: Diagnosis not present

## 2016-12-08 DIAGNOSIS — J45909 Unspecified asthma, uncomplicated: Secondary | ICD-10-CM | POA: Diagnosis not present

## 2016-12-08 DIAGNOSIS — Z90710 Acquired absence of both cervix and uterus: Secondary | ICD-10-CM | POA: Diagnosis not present

## 2016-12-08 DIAGNOSIS — M797 Fibromyalgia: Secondary | ICD-10-CM | POA: Insufficient documentation

## 2016-12-08 DIAGNOSIS — F329 Major depressive disorder, single episode, unspecified: Secondary | ICD-10-CM | POA: Diagnosis not present

## 2016-12-08 DIAGNOSIS — Z794 Long term (current) use of insulin: Secondary | ICD-10-CM | POA: Insufficient documentation

## 2016-12-08 DIAGNOSIS — F419 Anxiety disorder, unspecified: Secondary | ICD-10-CM | POA: Insufficient documentation

## 2016-12-08 DIAGNOSIS — Z6838 Body mass index (BMI) 38.0-38.9, adult: Secondary | ICD-10-CM | POA: Diagnosis not present

## 2016-12-08 DIAGNOSIS — I10 Essential (primary) hypertension: Secondary | ICD-10-CM | POA: Insufficient documentation

## 2016-12-08 DIAGNOSIS — G8929 Other chronic pain: Secondary | ICD-10-CM | POA: Insufficient documentation

## 2016-12-08 DIAGNOSIS — G5622 Lesion of ulnar nerve, left upper limb: Secondary | ICD-10-CM | POA: Insufficient documentation

## 2016-12-08 DIAGNOSIS — K589 Irritable bowel syndrome without diarrhea: Secondary | ICD-10-CM | POA: Diagnosis not present

## 2016-12-08 DIAGNOSIS — Z91048 Other nonmedicinal substance allergy status: Secondary | ICD-10-CM | POA: Insufficient documentation

## 2016-12-08 DIAGNOSIS — M1990 Unspecified osteoarthritis, unspecified site: Secondary | ICD-10-CM | POA: Diagnosis not present

## 2016-12-08 DIAGNOSIS — E7800 Pure hypercholesterolemia, unspecified: Secondary | ICD-10-CM | POA: Diagnosis not present

## 2016-12-08 DIAGNOSIS — Z91040 Latex allergy status: Secondary | ICD-10-CM | POA: Diagnosis not present

## 2016-12-08 DIAGNOSIS — Z87891 Personal history of nicotine dependence: Secondary | ICD-10-CM | POA: Diagnosis not present

## 2016-12-08 DIAGNOSIS — M5030 Other cervical disc degeneration, unspecified cervical region: Secondary | ICD-10-CM | POA: Insufficient documentation

## 2016-12-08 DIAGNOSIS — E039 Hypothyroidism, unspecified: Secondary | ICD-10-CM | POA: Diagnosis not present

## 2016-12-08 HISTORY — PX: ULNAR NERVE TRANSPOSITION: SHX2595

## 2016-12-08 LAB — GLUCOSE, CAPILLARY
Glucose-Capillary: 151 mg/dL — ABNORMAL HIGH (ref 65–99)
Glucose-Capillary: 127 mg/dL — ABNORMAL HIGH (ref 65–99)

## 2016-12-08 SURGERY — ULNAR NERVE DECOMPRESSION/TRANSPOSITION
Anesthesia: General | Site: Elbow | Laterality: Left

## 2016-12-08 MED ORDER — CEFAZOLIN SODIUM-DEXTROSE 2-4 GM/100ML-% IV SOLN
INTRAVENOUS | Status: AC
  Filled 2016-12-08: qty 100

## 2016-12-08 MED ORDER — BUPIVACAINE-EPINEPHRINE 0.5% -1:200000 IJ SOLN
INTRAMUSCULAR | Status: DC | PRN
  Administered 2016-12-08: 10 mL

## 2016-12-08 MED ORDER — DEXAMETHASONE SODIUM PHOSPHATE 10 MG/ML IJ SOLN
INTRAMUSCULAR | Status: DC | PRN
  Administered 2016-12-08: 4 mg via INTRAVENOUS

## 2016-12-08 MED ORDER — FENTANYL CITRATE (PF) 100 MCG/2ML IJ SOLN
INTRAMUSCULAR | Status: AC
  Filled 2016-12-08: qty 2

## 2016-12-08 MED ORDER — MELOXICAM 15 MG PO TABS: 15.0000 mg | ORAL_TABLET | Freq: Every day | ORAL | 1 refills | Status: DC

## 2016-12-08 MED ORDER — HYDROMORPHONE HCL 1 MG/ML IJ SOLN
INTRAMUSCULAR | Status: AC
  Filled 2016-12-08: qty 0.5

## 2016-12-08 MED ORDER — ONDANSETRON HCL 4 MG/2ML IJ SOLN
INTRAMUSCULAR | Status: DC | PRN
Start: 2016-12-08 — End: 2016-12-08
  Administered 2016-12-08: 4 mg via INTRAVENOUS

## 2016-12-08 MED ORDER — MIDAZOLAM HCL 2 MG/2ML IJ SOLN
INTRAMUSCULAR | Status: AC
  Filled 2016-12-08: qty 2

## 2016-12-08 MED ORDER — PROPOFOL 10 MG/ML IV BOLUS
INTRAVENOUS | Status: DC | PRN
  Administered 2016-12-08: 150 mg via INTRAVENOUS

## 2016-12-08 MED ORDER — LACTATED RINGERS IV SOLN: INTRAVENOUS | Status: DC

## 2016-12-08 MED ORDER — OXYCODONE HCL 5 MG PO TABS: 5.0000 mg | ORAL_TABLET | Freq: Four times a day (QID) | ORAL | 0 refills | Status: DC | PRN

## 2016-12-08 MED ORDER — ACETAMINOPHEN 325 MG PO TABS: 650.0000 mg | ORAL_TABLET | Freq: Four times a day (QID) | ORAL | Status: DC

## 2016-12-08 MED ORDER — LIDOCAINE HCL (CARDIAC) 20 MG/ML IV SOLN
INTRAVENOUS | Status: DC | PRN
  Administered 2016-12-08: 60 mg via INTRAVENOUS

## 2016-12-08 MED ORDER — HYDROMORPHONE HCL 1 MG/ML IJ SOLN
0.2500 mg | INTRAMUSCULAR | Status: DC | PRN
  Administered 2016-12-08: 0.5 mg via INTRAVENOUS

## 2016-12-08 MED ORDER — MIDAZOLAM HCL 2 MG/2ML IJ SOLN
1.0000 mg | INTRAMUSCULAR | Status: DC | PRN
  Administered 2016-12-08: 2 mg via INTRAVENOUS

## 2016-12-08 MED ORDER — FENTANYL CITRATE (PF) 100 MCG/2ML IJ SOLN
50.0000 ug | INTRAMUSCULAR | Status: DC | PRN
  Administered 2016-12-08 (×2): 50 ug via INTRAVENOUS

## 2016-12-08 MED ORDER — CEFAZOLIN SODIUM-DEXTROSE 2-4 GM/100ML-% IV SOLN
2.0000 g | INTRAVENOUS | Status: AC
  Administered 2016-12-08: 2 g via INTRAVENOUS

## 2016-12-08 MED ORDER — SCOPOLAMINE 1 MG/3DAYS TD PT72: 1.0000 | MEDICATED_PATCH | Freq: Once | TRANSDERMAL | Status: DC | PRN

## 2016-12-08 MED ORDER — LACTATED RINGERS IV SOLN
INTRAVENOUS | Status: DC
  Administered 2016-12-08 (×2): via INTRAVENOUS

## 2016-12-08 SURGICAL SUPPLY — 61 items
APL SKNCLS STERI-STRIP NONHPOA (GAUZE/BANDAGES/DRESSINGS) ×1
BENZOIN TINCTURE PRP APPL 2/3 (GAUZE/BANDAGES/DRESSINGS) ×3 IMPLANT
BLADE MINI RND TIP GREEN BEAV (BLADE) IMPLANT
BLADE SURG 15 STRL LF DISP TIS (BLADE) ×1 IMPLANT
BLADE SURG 15 STRL SS (BLADE) ×3
BNDG CMPR 9X4 STRL LF SNTH (GAUZE/BANDAGES/DRESSINGS) ×1
BNDG COHESIVE 4X5 TAN STRL (GAUZE/BANDAGES/DRESSINGS) ×3 IMPLANT
BNDG ESMARK 4X9 LF (GAUZE/BANDAGES/DRESSINGS) ×2 IMPLANT
BNDG GAUZE ELAST 4 BULKY (GAUZE/BANDAGES/DRESSINGS) ×3 IMPLANT
CHLORAPREP W/TINT 26ML (MISCELLANEOUS) ×3 IMPLANT
CLOSURE WOUND 1/2 X4 (GAUZE/BANDAGES/DRESSINGS) ×1
CORD BIPOLAR FORCEPS 12FT (ELECTRODE) ×3 IMPLANT
COVER BACK TABLE 60X90IN (DRAPES) ×3 IMPLANT
COVER MAYO STAND STRL (DRAPES) ×3 IMPLANT
CUFF TOURN SGL LL 18 NRW (TOURNIQUET CUFF) ×3 IMPLANT
DRAIN PENROSE 1/2X12 LTX STRL (WOUND CARE) IMPLANT
DRAIN PENROSE 1/4X12 LTX STRL (WOUND CARE) IMPLANT
DRAPE EXTREMITY T 121X128X90 (DRAPE) ×3 IMPLANT
DRAPE SURG 17X23 STRL (DRAPES) ×3 IMPLANT
DRAPE U-SHAPE 47X51 STRL (DRAPES) IMPLANT
DRSG ADAPTIC 3X8 NADH LF (GAUZE/BANDAGES/DRESSINGS) ×2 IMPLANT
DRSG EMULSION OIL 3X3 NADH (GAUZE/BANDAGES/DRESSINGS) ×2 IMPLANT
ELECT REM PT RETURN 9FT ADLT (ELECTROSURGICAL) ×3
ELECTRODE REM PT RTRN 9FT ADLT (ELECTROSURGICAL) ×1 IMPLANT
GAUZE SPONGE 4X4 12PLY STRL (GAUZE/BANDAGES/DRESSINGS) ×2 IMPLANT
GAUZE SPONGE 4X4 12PLY STRL LF (GAUZE/BANDAGES/DRESSINGS) ×3 IMPLANT
GLOVE BIO SURGEON STRL SZ7.5 (GLOVE) ×3 IMPLANT
GLOVE BIOGEL PI IND STRL 7.0 (GLOVE) ×1 IMPLANT
GLOVE BIOGEL PI IND STRL 8 (GLOVE) ×1 IMPLANT
GLOVE BIOGEL PI INDICATOR 7.0 (GLOVE) ×2
GLOVE BIOGEL PI INDICATOR 8 (GLOVE) ×2
GLOVE ECLIPSE 6.5 STRL STRAW (GLOVE) ×3 IMPLANT
GOWN STRL REUS W/ TWL LRG LVL3 (GOWN DISPOSABLE) ×2 IMPLANT
GOWN STRL REUS W/TWL LRG LVL3 (GOWN DISPOSABLE) ×6
GOWN STRL REUS W/TWL XL LVL3 (GOWN DISPOSABLE) ×3 IMPLANT
LOOP VESSEL MAXI BLUE (MISCELLANEOUS) IMPLANT
NDL HYPO 25X1 1.5 SAFETY (NEEDLE) IMPLANT
NEEDLE HYPO 25X1 1.5 SAFETY (NEEDLE) IMPLANT
NS IRRIG 1000ML POUR BTL (IV SOLUTION) ×3 IMPLANT
PACK BASIN DAY SURGERY FS (CUSTOM PROCEDURE TRAY) ×3 IMPLANT
PADDING CAST ABS 4INX4YD NS (CAST SUPPLIES)
PADDING CAST ABS COTTON 4X4 ST (CAST SUPPLIES) IMPLANT
PENCIL BUTTON HOLSTER BLD 10FT (ELECTRODE) ×2 IMPLANT
SLING ARM FOAM STRAP LRG (SOFTGOODS) IMPLANT
SLING ARM MED ADULT FOAM STRAP (SOFTGOODS) IMPLANT
SLING ARM XL FOAM STRAP (SOFTGOODS) IMPLANT
STOCKINETTE 6  STRL (DRAPES) ×2
STOCKINETTE 6 STRL (DRAPES) ×1 IMPLANT
STRIP CLOSURE SKIN 1/2X4 (GAUZE/BANDAGES/DRESSINGS) ×2 IMPLANT
SUT ETHILON 8 0 BV130 4 (SUTURE) IMPLANT
SUT VIC AB 0 SH 27 (SUTURE) IMPLANT
SUT VIC AB 2-0 SH 27 (SUTURE)
SUT VIC AB 2-0 SH 27XBRD (SUTURE) IMPLANT
SUT VIC AB 3-0 SH 27 (SUTURE) ×3
SUT VIC AB 3-0 SH 27X BRD (SUTURE) ×1 IMPLANT
SUT VICRYL RAPIDE 4/0 PS 2 (SUTURE) ×3 IMPLANT
SYR 10ML LL (SYRINGE) IMPLANT
SYR BULB 3OZ (MISCELLANEOUS) ×3 IMPLANT
TOWEL OR 17X24 6PK STRL BLUE (TOWEL DISPOSABLE) ×6 IMPLANT
TOWEL OR NON WOVEN STRL DISP B (DISPOSABLE) ×3 IMPLANT
UNDERPAD 30X30 (UNDERPADS AND DIAPERS) ×3 IMPLANT

## 2016-12-08 NOTE — Interval H&P Note (Signed)
History and Physical Interval Note:  12/08/2016 10:44 AM  [name]  has presented today for surgery, with the diagnosis of LEFT ULNAR NEUROPATHY G56.22  The various methods of treatment have been discussed with the patient and family. After consideration of risks, benefits and other options for treatment, the patient has consented to  Procedure(s): LEFT ULNAR NEUROPLASTY AT THE ELBOW (Left) as a surgical intervention .  The patient's history has been reviewed, patient examined, no change in status, stable for surgery.  I have reviewed the patient's chart and labs.  Questions were answered to the patient's satisfaction.     [name]

## 2016-12-08 NOTE — Discharge Instructions (Addendum)
Discharge Instructions   You have a light dressing on your hand.  You may begin gentle motion of your fingers and hand immediately, but you should not do any heavy lifting or gripping.  Elevate your hand to reduce pain & swelling of the digits.  Ice over the operative site may be helpful to reduce pain & swelling.  DO NOT USE HEAT. Leave the dressing in place until the third day after your surgery and then remove it, leaving it open to air.  After the bandage has been removed you may shower, regularly washing the incision and letting the water run over it, but not submerging it (no swimming, soaking it in dishwater, etc.) You may drive a car when you are off of prescription pain medications and can safely control your vehicle with both hands. We will address whether therapy will be required or not when you return to the office. You may have already made your follow-up appointment when we completed your preop visit.  If not, please call our office today or the next business day to make your return appointment for 10-15 days after surgery.   Please call [phone] during normal business hours or [phone] after hours for any problems. Including the following:  - excessive redness of the incisions - drainage for more than 4 days - fever of more than 101.5 F  *Please note that pain medications will not be refilled after hours or on weekends.      Post Anesthesia Home Care Instructions  Activity: Get plenty of rest for the remainder of the day. A responsible individual must stay with you for 24 hours following the procedure.  For the next 24 hours, DO NOT: -Drive a car -Advertising copywriterperate machinery -Drink alcoholic beverages -Take any medication unless instructed by your physician -Make any legal decisions or sign important papers.  Meals: Start with liquid foods such as gelatin or soup. Progress to regular foods as tolerated. Avoid greasy, spicy, heavy foods. If nausea and/or vomiting  occur, drink only clear liquids until the nausea and/or vomiting subsides. Call your physician if vomiting continues.  Special Instructions/Symptoms: Your throat may feel dry or sore from the anesthesia or the breathing tube placed in your throat during surgery. If this causes discomfort, gargle with warm salt water. The discomfort should disappear within 24 hours.  If you had a scopolamine patch placed behind your ear for the management of post- operative nausea and/or vomiting:  1. The medication in the patch is effective for 72 hours, after which it should be removed.  Wrap patch in a tissue and discard in the trash. Wash hands thoroughly with soap and water. 2. You may remove the patch earlier than 72 hours if you experience unpleasant side effects which may include dry mouth, dizziness or visual disturbances. 3. Avoid touching the patch. Wash your hands with soap and water after contact with the patch.

## 2016-12-08 NOTE — Op Note (Signed)
12/08/2016  10:45 AM  PATIENT:  [name]  56 y.o. female  PRE-OPERATIVE DIAGNOSIS: Left ulnar neuropathy at the elbow  POST-OPERATIVE DIAGNOSIS:  Same  PROCEDURE: Left ulnar neuroplasty at the elbow (DIS)  SURGEON: Cliffton Astersavid A. Janee Mornhompson, MD  PHYSICIAN ASSISTANT: None  ANESTHESIA:  general  SPECIMENS:  None  DRAINS:   None  EBL:  less than 50 mL  PREOPERATIVE INDICATIONS:  [name] is a  56 y.o. female with left ulnar neuropathy at the elbow having failed nonoperative management  The risks benefits and alternatives were discussed with the patient preoperatively including but not limited to the risks of infection, bleeding, nerve injury, cardiopulmonary complications, the need for revision surgery, among others, and the patient verbalized understanding and consented to proceed.  OPERATIVE IMPLANTS: None  OPERATIVE PROCEDURE:  After receiving prophylactic antibiotics, the patient was escorted to the operative theatre and placed in a supine position.  General anesthesia was administered.  A surgical "time-out" was performed during which the planned procedure, proposed operative site, and the correct patient identity were compared to the operative consent and agreement confirmed by the circulating nurse according to current facility policy.  Following application of a tourniquet to the operative extremity, the exposed skin was prepped with Chloraprep and draped in the usual sterile fashion.  The limb was exsanguinated with an Esmarch bandage and the tourniquet inflated to approximately 100mmHg higher than systolic BP.  A curvilinear incision was marked and made over the course of the ulnar nerve at the medial aspect of the elbow.  Surgical site was infiltrated with Marcaine bearing epinephrine to help with postoperative pain control and hemostasis.  The skin was incised sharply with a scalpel.  Subcutaneous tissues were dissected carefully with blunt  spreading dissection.  Further dissection was carried out at the level of the fascia, elevating all the crossing structures into the subcutaneous flap.  The ulnar nerve was identified where it was carefully dissected free throughout the course of the cubital tunnel, extending for approximately 10 cm proximal and distal, following it through the FCU to release both the superficial and deep fascias.  It was free throughout this extent, and sufficiently retained within the groove the incision was made not to proceed with subcutaneous anterior transposition.  Tourniquet was released, the wound irrigated, and skin closed with 3-0 Vicryl deep dermal buried subcuticular sutures and running 4-0 Vicryl Rapide horizontal mattress suture in the skin.  A bulky long-arm dressing was applied she was awakened and taken to the recovery room in stable condition, breathing spontaneously  DISPOSITION: She will be discharged home today with typical instructions, return in 10-15 days.

## 2016-12-08 NOTE — Transfer of Care (Signed)
Immediate Anesthesia Transfer of Care Note  Patient: Lori CitizenKimberly Hernandez-Morales  Procedure(s) Performed: LEFT ULNAR NEUROPLASTY AT THE ELBOW (Left Elbow)  Patient Location: PACU  Anesthesia Type:General  Level of Consciousness: awake, alert , oriented and patient cooperative  Airway & Oxygen Therapy: Patient Spontanous Breathing and Patient connected to face mask oxygen  Post-op Assessment: Report given to RN and Post -op Vital signs reviewed and stable  Post vital signs: Reviewed and stable  Last Vitals:  Vitals:   12/08/16 1012 12/08/16 1249  BP: 107/61   Pulse: 78 98  Resp: 18 (!) 21  Temp: 36.4 C   SpO2: 97% 95%    Last Pain:  Vitals:   12/08/16 1012  TempSrc: Oral  PainSc: 3       Patients Stated Pain Goal: 3 (12/08/16 1012)  Complications: No apparent anesthesia complications

## 2016-12-08 NOTE — Anesthesia Preprocedure Evaluation (Addendum)
Anesthesia Evaluation  Patient identified by MRN, date of birth, ID band Patient awake    Reviewed: Allergy & Precautions, H&P , NPO status , Patient's Chart, lab work & pertinent test results  Airway Mallampati: II  TM Distance: >3 FB Neck ROM: Full    Dental no notable dental hx. (+) Upper Dentures, Edentulous Lower, Dental Advisory Given   Pulmonary asthma , sleep apnea and Continuous Positive Airway Pressure Ventilation , COPD,  COPD inhaler, former smoker,    Pulmonary exam normal breath sounds clear to auscultation       Cardiovascular hypertension, Pt. on medications  Rhythm:Regular Rate:Normal     Neuro/Psych Anxiety Depression negative neurological ROS     GI/Hepatic negative GI ROS, Neg liver ROS,   Endo/Other  diabetes, Insulin Dependent, Oral Hypoglycemic AgentsHypothyroidism Morbid obesity  Renal/GU negative Renal ROS  negative genitourinary   Musculoskeletal  (+) Arthritis , Fibromyalgia -  Abdominal   Peds  Hematology negative hematology ROS (+)   Anesthesia Other Findings   Reproductive/Obstetrics negative OB ROS                            Anesthesia Physical Anesthesia Plan  ASA: III  Anesthesia Plan: General   Post-op Pain Management:    Induction: Intravenous  PONV Risk Score and Plan: 4 or greater and Ondansetron, Dexamethasone and Midazolam  Airway Management Planned: LMA  Additional Equipment:   Intra-op Plan:   Post-operative Plan: Extubation in OR  Informed Consent: I have reviewed the patients History and Physical, chart, labs and discussed the procedure including the risks, benefits and alternatives for the proposed anesthesia with the patient or authorized representative who has indicated his/her understanding and acceptance.   Dental advisory given  Plan Discussed with: CRNA  Anesthesia Plan Comments:         Anesthesia Quick  Evaluation

## 2016-12-08 NOTE — Anesthesia Procedure Notes (Signed)
Procedure Name: LMA Insertion Date/Time: 12/08/2016 12:06 PM Performed by: Sheryn BisonBlocker, Quiana Cobaugh D, CRNA Pre-anesthesia Checklist: Patient identified, Emergency Drugs available, Suction available and Patient being monitored Patient Re-evaluated:Patient Re-evaluated prior to induction Oxygen Delivery Method: Circle system utilized Preoxygenation: Pre-oxygenation with 100% oxygen Induction Type: IV induction Ventilation: Mask ventilation without difficulty LMA: LMA inserted LMA Size: 4.0 Number of attempts: 1 Airway Equipment and Method: Bite block Placement Confirmation: positive ETCO2 Tube secured with: Tape Dental Injury: Teeth and Oropharynx as per pre-operative assessment

## 2016-12-09 ENCOUNTER — Encounter (HOSPITAL_BASED_OUTPATIENT_CLINIC_OR_DEPARTMENT_OTHER): Payer: Self-pay | Admitting: Orthopedic Surgery

## 2016-12-09 NOTE — Anesthesia Postprocedure Evaluation (Signed)
Anesthesia Post Note  Patient: [name]  Procedure(s) Performed: LEFT ULNAR NEUROPLASTY AT THE ELBOW (Left Elbow)     Patient location during evaluation: PACU Anesthesia Type: General Level of consciousness: awake and alert Pain management: pain level controlled Vital Signs Assessment: post-procedure vital signs reviewed and stable Respiratory status: spontaneous breathing, nonlabored ventilation and respiratory function stable Cardiovascular status: blood pressure returned to baseline and stable Postop Assessment: no apparent nausea or vomiting Anesthetic complications: no    Last Vitals:  Vitals:   12/08/16 1400 12/08/16 1430  BP: 114/67 118/64  Pulse: 82 85  Resp: 16 16  Temp:  36.9 C  SpO2: 91% 94%    Last Pain:  Vitals:   12/08/16 1430  TempSrc:   PainSc: 0-No pain                 Melquan Ernsberger,W. EDMOND

## 2017-04-15 DIAGNOSIS — M79671 Pain in right foot: Secondary | ICD-10-CM

## 2017-04-15 DIAGNOSIS — M24572 Contracture, left ankle: Secondary | ICD-10-CM | POA: Insufficient documentation

## 2017-04-15 HISTORY — DX: Pain in right foot: M79.671

## 2017-04-15 HISTORY — DX: Contracture, left ankle: M24.572

## 2017-09-27 DIAGNOSIS — Z87448 Personal history of other diseases of urinary system: Secondary | ICD-10-CM | POA: Insufficient documentation

## 2017-09-27 HISTORY — DX: Personal history of other diseases of urinary system: Z87.448

## 2017-10-07 ENCOUNTER — Other Ambulatory Visit: Payer: Self-pay

## 2017-10-07 ENCOUNTER — Inpatient Hospital Stay (HOSPITAL_COMMUNITY)
Admission: EM | Admit: 2017-10-07 | Discharge: 2017-10-09 | DRG: 690 | Disposition: A | Discharge status: Home / Self Care | Payer: Medicaid Other | Attending: Internal Medicine | Admitting: Internal Medicine

## 2017-10-07 ENCOUNTER — Encounter (HOSPITAL_COMMUNITY): Payer: Self-pay | Admitting: Emergency Medicine

## 2017-10-07 DIAGNOSIS — Z87891 Personal history of nicotine dependence: Secondary | ICD-10-CM

## 2017-10-07 DIAGNOSIS — Z96642 Presence of left artificial hip joint: Secondary | ICD-10-CM | POA: Diagnosis present

## 2017-10-07 DIAGNOSIS — E785 Hyperlipidemia, unspecified: Secondary | ICD-10-CM | POA: Diagnosis present

## 2017-10-07 DIAGNOSIS — E039 Hypothyroidism, unspecified: Secondary | ICD-10-CM | POA: Diagnosis present

## 2017-10-07 DIAGNOSIS — Z79899 Other long term (current) drug therapy: Secondary | ICD-10-CM

## 2017-10-07 DIAGNOSIS — M5136 Other intervertebral disc degeneration, lumbar region: Secondary | ICD-10-CM | POA: Diagnosis present

## 2017-10-07 DIAGNOSIS — M5030 Other cervical disc degeneration, unspecified cervical region: Secondary | ICD-10-CM | POA: Diagnosis present

## 2017-10-07 DIAGNOSIS — Z794 Long term (current) use of insulin: Secondary | ICD-10-CM | POA: Diagnosis not present

## 2017-10-07 DIAGNOSIS — Z7951 Long term (current) use of inhaled steroids: Secondary | ICD-10-CM

## 2017-10-07 DIAGNOSIS — Z91040 Latex allergy status: Secondary | ICD-10-CM

## 2017-10-07 DIAGNOSIS — E1140 Type 2 diabetes mellitus with diabetic neuropathy, unspecified: Secondary | ICD-10-CM | POA: Diagnosis present

## 2017-10-07 DIAGNOSIS — J449 Chronic obstructive pulmonary disease, unspecified: Secondary | ICD-10-CM

## 2017-10-07 DIAGNOSIS — E7800 Pure hypercholesterolemia, unspecified: Secondary | ICD-10-CM | POA: Diagnosis present

## 2017-10-07 DIAGNOSIS — I959 Hypotension, unspecified: Secondary | ICD-10-CM | POA: Diagnosis present

## 2017-10-07 DIAGNOSIS — Z79891 Long term (current) use of opiate analgesic: Secondary | ICD-10-CM | POA: Diagnosis not present

## 2017-10-07 DIAGNOSIS — Z90710 Acquired absence of both cervix and uterus: Secondary | ICD-10-CM

## 2017-10-07 DIAGNOSIS — G2581 Restless legs syndrome: Secondary | ICD-10-CM | POA: Diagnosis present

## 2017-10-07 DIAGNOSIS — Z79890 Hormone replacement therapy (postmenopausal): Secondary | ICD-10-CM

## 2017-10-07 DIAGNOSIS — N12 Tubulo-interstitial nephritis, not specified as acute or chronic: Secondary | ICD-10-CM

## 2017-10-07 DIAGNOSIS — R509 Fever, unspecified: Secondary | ICD-10-CM

## 2017-10-07 DIAGNOSIS — Z833 Family history of diabetes mellitus: Secondary | ICD-10-CM

## 2017-10-07 DIAGNOSIS — Z791 Long term (current) use of non-steroidal anti-inflammatories (NSAID): Secondary | ICD-10-CM

## 2017-10-07 DIAGNOSIS — G8929 Other chronic pain: Secondary | ICD-10-CM | POA: Diagnosis present

## 2017-10-07 DIAGNOSIS — Z7984 Long term (current) use of oral hypoglycemic drugs: Secondary | ICD-10-CM

## 2017-10-07 DIAGNOSIS — Z885 Allergy status to narcotic agent status: Secondary | ICD-10-CM

## 2017-10-07 DIAGNOSIS — I10 Essential (primary) hypertension: Secondary | ICD-10-CM | POA: Diagnosis present

## 2017-10-07 DIAGNOSIS — M797 Fibromyalgia: Secondary | ICD-10-CM | POA: Diagnosis present

## 2017-10-07 DIAGNOSIS — Z9049 Acquired absence of other specified parts of digestive tract: Secondary | ICD-10-CM | POA: Diagnosis not present

## 2017-10-07 DIAGNOSIS — Z9989 Dependence on other enabling machines and devices: Secondary | ICD-10-CM

## 2017-10-07 DIAGNOSIS — K589 Irritable bowel syndrome without diarrhea: Secondary | ICD-10-CM | POA: Diagnosis present

## 2017-10-07 DIAGNOSIS — R000 Tachycardia, unspecified: Secondary | ICD-10-CM | POA: Diagnosis present

## 2017-10-07 DIAGNOSIS — M545 Low back pain: Secondary | ICD-10-CM | POA: Diagnosis not present

## 2017-10-07 DIAGNOSIS — M1990 Unspecified osteoarthritis, unspecified site: Secondary | ICD-10-CM | POA: Diagnosis present

## 2017-10-07 DIAGNOSIS — G4730 Sleep apnea, unspecified: Secondary | ICD-10-CM | POA: Diagnosis present

## 2017-10-07 DIAGNOSIS — F419 Anxiety disorder, unspecified: Secondary | ICD-10-CM | POA: Diagnosis present

## 2017-10-07 DIAGNOSIS — F329 Major depressive disorder, single episode, unspecified: Secondary | ICD-10-CM | POA: Diagnosis present

## 2017-10-07 DIAGNOSIS — Z91048 Other nonmedicinal substance allergy status: Secondary | ICD-10-CM

## 2017-10-07 DIAGNOSIS — E1165 Type 2 diabetes mellitus with hyperglycemia: Secondary | ICD-10-CM | POA: Diagnosis present

## 2017-10-07 DIAGNOSIS — R109 Unspecified abdominal pain: Secondary | ICD-10-CM | POA: Diagnosis present

## 2017-10-07 DIAGNOSIS — N10 Acute tubulo-interstitial nephritis: Principal | ICD-10-CM | POA: Diagnosis present

## 2017-10-07 HISTORY — DX: Unspecified chronic bronchitis: J42

## 2017-10-07 HISTORY — DX: Obstructive sleep apnea (adult) (pediatric): G47.33

## 2017-10-07 HISTORY — DX: Dependence on other enabling machines and devices: Z99.89

## 2017-10-07 HISTORY — DX: Other chronic pain: G89.29

## 2017-10-07 HISTORY — DX: Type 2 diabetes mellitus with diabetic polyneuropathy: E11.42

## 2017-10-07 HISTORY — DX: Low back pain: M54.5

## 2017-10-07 HISTORY — DX: Low back pain, unspecified: M54.50

## 2017-10-07 HISTORY — DX: Type 2 diabetes mellitus without complications: E11.9

## 2017-10-07 HISTORY — DX: Tubulo-interstitial nephritis, not specified as acute or chronic: N12

## 2017-10-07 LAB — COMPREHENSIVE METABOLIC PANEL
ALBUMIN: 3.3 g/dL — AB (ref 3.5–5.0)
ALK PHOS: 66 U/L (ref 38–126)
ALT: 17 U/L (ref 0–44)
AST: 16 U/L (ref 15–41)
Anion gap: 11 (ref 5–15)
BILIRUBIN TOTAL: 0.6 mg/dL (ref 0.3–1.2)
BUN: 13 mg/dL (ref 6–20)
CALCIUM: 9.1 mg/dL (ref 8.9–10.3)
CO2: 24 mmol/L (ref 22–32)
Chloride: 101 mmol/L (ref 98–111)
Creatinine, Ser: 0.84 mg/dL (ref 0.44–1.00)
GLUCOSE: 169 mg/dL — AB (ref 70–99)
Potassium: 4.1 mmol/L (ref 3.5–5.1)
Sodium: 136 mmol/L (ref 135–145)
TOTAL PROTEIN: 6.5 g/dL (ref 6.5–8.1)

## 2017-10-07 LAB — GLUCOSE, CAPILLARY: GLUCOSE-CAPILLARY: 154 mg/dL — AB (ref 70–99)

## 2017-10-07 LAB — CBC WITH DIFFERENTIAL/PLATELET
ABS IMMATURE GRANULOCYTES: 0 10*3/uL (ref 0.0–0.1)
BASOS ABS: 0.1 10*3/uL (ref 0.0–0.1)
BASOS PCT: 1 %
EOS PCT: 2 %
Eosinophils Absolute: 0.1 10*3/uL (ref 0.0–0.7)
HCT: 35.6 % — ABNORMAL LOW (ref 36.0–46.0)
Hemoglobin: 11.1 g/dL — ABNORMAL LOW (ref 12.0–15.0)
Immature Granulocytes: 0 %
Lymphocytes Relative: 9 %
Lymphs Abs: 0.5 10*3/uL — ABNORMAL LOW (ref 0.7–4.0)
MCH: 28.2 pg (ref 26.0–34.0)
MCHC: 31.2 g/dL (ref 30.0–36.0)
MCV: 90.6 fL (ref 78.0–100.0)
MONO ABS: 0.5 10*3/uL (ref 0.1–1.0)
Monocytes Relative: 10 %
NEUTROS ABS: 4.3 10*3/uL (ref 1.7–7.7)
Neutrophils Relative %: 78 %
PLATELETS: 144 10*3/uL — AB (ref 150–400)
RBC: 3.93 MIL/uL (ref 3.87–5.11)
RDW: 14.6 % (ref 11.5–15.5)
WBC: 5.5 10*3/uL (ref 4.0–10.5)

## 2017-10-07 LAB — URINALYSIS, ROUTINE W REFLEX MICROSCOPIC
Bilirubin Urine: NEGATIVE
Glucose, UA: NEGATIVE mg/dL
Hgb urine dipstick: NEGATIVE
Ketones, ur: NEGATIVE mg/dL
Nitrite: POSITIVE — AB
PH: 5 (ref 5.0–8.0)
Protein, ur: NEGATIVE mg/dL
SPECIFIC GRAVITY, URINE: 1.014 (ref 1.005–1.030)
WBC, UA: >50 WBC/hpf — ABNORMAL HIGH (ref 0–5)

## 2017-10-07 LAB — I-STAT CG4 LACTIC ACID, ED
Lactic Acid, Venous: 1.05 mmol/L (ref 0.5–1.9)
Lactic Acid, Venous: 1.09 mmol/L (ref 0.5–1.9)

## 2017-10-07 LAB — CBG MONITORING, ED: Glucose-Capillary: 101 mg/dL — ABNORMAL HIGH (ref 70–99)

## 2017-10-07 MED ORDER — ACETAMINOPHEN 325 MG PO TABS
650.0000 mg | ORAL_TABLET | Freq: Once | ORAL | Status: AC
  Administered 2017-10-07: 650 mg via ORAL
  Filled 2017-10-07: qty 2

## 2017-10-07 MED ORDER — SODIUM CHLORIDE 0.9 % IV SOLN
1000.0000 mL | INTRAVENOUS | Status: DC
  Administered 2017-10-07 – 2017-10-09 (×7): 1000 mL via INTRAVENOUS

## 2017-10-07 MED ORDER — LEVOTHYROXINE SODIUM 50 MCG PO TABS
50.0000 ug | ORAL_TABLET | Freq: Every day | ORAL | Status: DC
  Administered 2017-10-08 – 2017-10-09 (×2): 50 ug via ORAL
  Filled 2017-10-07 (×2): qty 1

## 2017-10-07 MED ORDER — PROCHLORPERAZINE EDISYLATE 10 MG/2ML IJ SOLN
10.0000 mg | Freq: Four times a day (QID) | INTRAMUSCULAR | Status: DC | PRN
  Administered 2017-10-07 – 2017-10-09 (×3): 10 mg via INTRAVENOUS
  Filled 2017-10-07 (×3): qty 2

## 2017-10-07 MED ORDER — INSULIN ASPART 100 UNIT/ML ~~LOC~~ SOLN: 0.0000 [IU] | Freq: Every day | SUBCUTANEOUS | Status: DC

## 2017-10-07 MED ORDER — MELOXICAM 7.5 MG PO TABS: 15.0000 mg | ORAL_TABLET | Freq: Every day | ORAL | Status: DC

## 2017-10-07 MED ORDER — SODIUM CHLORIDE 0.9% FLUSH
3.0000 mL | Freq: Two times a day (BID) | INTRAVENOUS | Status: DC
  Administered 2017-10-07 – 2017-10-09 (×4): 3 mL via INTRAVENOUS

## 2017-10-07 MED ORDER — FLUTICASONE FUROATE-VILANTEROL 100-25 MCG/INH IN AEPB
1.0000 | INHALATION_SPRAY | Freq: Every day | RESPIRATORY_TRACT | Status: DC
  Administered 2017-10-08 – 2017-10-09 (×2): 1 via RESPIRATORY_TRACT
  Filled 2017-10-07: qty 28

## 2017-10-07 MED ORDER — SENNOSIDES-DOCUSATE SODIUM 8.6-50 MG PO TABS: 1.0000 | ORAL_TABLET | Freq: Every evening | ORAL | Status: DC | PRN

## 2017-10-07 MED ORDER — ENOXAPARIN SODIUM 40 MG/0.4ML ~~LOC~~ SOLN
40.0000 mg | SUBCUTANEOUS | Status: DC
  Administered 2017-10-07 – 2017-10-08 (×2): 40 mg via SUBCUTANEOUS
  Filled 2017-10-07 (×3): qty 0.4

## 2017-10-07 MED ORDER — ACETAMINOPHEN 325 MG PO TABS
650.0000 mg | ORAL_TABLET | Freq: Four times a day (QID) | ORAL | Status: DC | PRN
  Administered 2017-10-07 – 2017-10-08 (×4): 650 mg via ORAL
  Filled 2017-10-07 (×4): qty 2

## 2017-10-07 MED ORDER — SODIUM CHLORIDE 0.9 % IV BOLUS
1000.0000 mL | Freq: Once | INTRAVENOUS | Status: AC
  Administered 2017-10-07: 1000 mL via INTRAVENOUS

## 2017-10-07 MED ORDER — HYDROCODONE-ACETAMINOPHEN 10-325 MG PO TABS: 1.0000 | ORAL_TABLET | Freq: Two times a day (BID) | ORAL | Status: DC | PRN

## 2017-10-07 MED ORDER — ONDANSETRON HCL 4 MG/2ML IJ SOLN: 4.0000 mg | Freq: Three times a day (TID) | INTRAMUSCULAR | Status: DC | PRN

## 2017-10-07 MED ORDER — INSULIN ASPART 100 UNIT/ML ~~LOC~~ SOLN
0.0000 [IU] | Freq: Three times a day (TID) | SUBCUTANEOUS | Status: DC
  Administered 2017-10-09: 3 [IU] via SUBCUTANEOUS
  Administered 2017-10-09: 2 [IU] via SUBCUTANEOUS

## 2017-10-07 MED ORDER — ACETAMINOPHEN 650 MG RE SUPP: 650.0000 mg | Freq: Four times a day (QID) | RECTAL | Status: DC | PRN

## 2017-10-07 MED ORDER — SODIUM CHLORIDE 0.9 % IV SOLN
1.0000 g | INTRAVENOUS | Status: DC
  Administered 2017-10-07 – 2017-10-09 (×3): 1 g via INTRAVENOUS
  Filled 2017-10-07 (×3): qty 10

## 2017-10-07 MED ORDER — ALBUTEROL SULFATE (2.5 MG/3ML) 0.083% IN NEBU: 2.5000 mg | INHALATION_SOLUTION | RESPIRATORY_TRACT | Status: DC | PRN

## 2017-10-07 MED ORDER — INSULIN GLARGINE 100 UNIT/ML ~~LOC~~ SOLN
15.0000 [IU] | Freq: Every day | SUBCUTANEOUS | Status: DC
  Administered 2017-10-07 – 2017-10-08 (×2): 15 [IU] via SUBCUTANEOUS
  Filled 2017-10-07 (×3): qty 0.15

## 2017-10-07 NOTE — H&P (Addendum)
Date: 10/07/2017               Patient Name:  [name] MRN: [id number]  DOB: 1960/03/28 Age / Sex: 57 y.o., female   PCP: Dema Severin, NP         Medical Service: Internal Medicine Teaching Service         Attending Physician: Dr. Burns Spain, MD    First Contact: Wendie Simmer, OMS-IV Pager: [phone]  Second Contact: Dr. Beola Cord Pager: [phone]       After Hours (After 5p/  First Contact Pager: [phone]  weekends / holidays): Second Contact Pager: [phone]   Chief Complaint: Right back pain  History of Present Illness:  [name] is a 57 y.o Hispanic Female w/ a PMHx of HTN, Dyslipidemia, T2DM, Neuropathy, COPD, Fibromyalgia, OA & Chronic Low Back Pain that presents with R mid back pain that began last Thursday (10/01/17) and lasted for one additional day before subsiding completely. Patient began to experience fevers and chills beginning on 9/8, which she treated with Tylenol. This morning the same back pain flared up and along with further fevers (measured at 100.5) & chills unabated w/ Tylenol, weakness, and decreased PO intake, she visited her PCP Dr. Mauricio Po today where she was subsequently prompted to come to this hospital for further evaluation. She endorsed urinary frequency, and dysuria. In addition to the above symptoms, she complains of a pounding HA localized to her forehead, dizziness described as her vision closing in on her, nausea, urinary urgency & frequency, but denies any chest pain, shortness of breath, dyspnea on exertion, vomiting, diarrhea, hematuria, or further complaints.   In the ED, she was found to febrile at 102.6, tachycardic at 101 and hypotensive at 100/57. She was given a 1L bolus of NS along w/ maintenance fluid rate at 125 cc/hr w/ mild improvement of her BP. She had a normal lactic acid value of 1.05 & 1.09 and a blood & urine Cx were taken. A UA revealed a UTI w/ leukocyte, nitrites and  >50 WBC so she was started on IV Rocephin 1g and due to the persistence of temperature & back pain, we were consulted for possible pyelonephritis.  Meds:  Current Meds  Medication Sig  . acetaminophen (TYLENOL) 325 MG tablet Take 2 tablets (650 mg total) every 6 (six) hours by mouth.  Marland Kitchen albuterol (PROVENTIL HFA;VENTOLIN HFA) 108 (90 Base) MCG/ACT inhaler Inhale every 6 (six) hours as needed into the lungs for wheezing or shortness of breath.  Marland Kitchen atorvastatin (LIPITOR) 10 MG tablet Take 10 mg by mouth daily.  . cyclobenzaprine (FLEXERIL) 10 MG tablet Take 10 mg by mouth daily as needed for muscle spasms.   . dapagliflozin propanediol (FARXIGA) 10 MG TABS tablet Take 10 mg daily by mouth.  . escitalopram (LEXAPRO) 20 MG tablet Take 20 mg by mouth daily.  . fluticasone furoate-vilanterol (BREO ELLIPTA) 100-25 MCG/INH AEPB Inhale 1 puff into the lungs daily.  Marland Kitchen HYDROcodone-acetaminophen (NORCO) 10-325 MG tablet Take 1 tablet by mouth 2 (two) times daily as needed.  . insulin degludec (TRESIBA FLEXTOUCH) 100 UNIT/ML SOPN FlexTouch Pen Inject 24 Units into the skin daily at 10 pm.   . levothyroxine (SYNTHROID, LEVOTHROID) 50 MCG tablet Take 50 mcg by mouth daily before breakfast.  . lisinopril (PRINIVIL,ZESTRIL) 5 MG tablet Take 2.5 mg by mouth daily.   . meloxicam (MOBIC) 15 MG tablet Take 1 tablet (15 mg total) daily by mouth.  Marland Kitchen  metFORMIN (GLUCOPHAGE-XR) 500 MG 24 hr tablet Take 500 mg by mouth 2 (two) times daily.  . Morphine-Naltrexone (EMBEDA) 60-2.4 MG CPCR Take 1 tablet by mouth daily.   . pioglitazone (ACTOS) 15 MG tablet Take 15 mg by mouth daily.  . pregabalin (LYRICA) 200 MG capsule Take 200 mg by mouth 3 (three) times daily.  . QUEtiapine (SEROQUEL) 100 MG tablet Take 100 mg at bedtime by mouth.  Marland Kitchen rOPINIRole (REQUIP) 0.25 MG tablet Take 0.25 mg by mouth as needed.  . [DISCONTINUED] oxyCODONE (ROXICODONE) 5 MG immediate release tablet Take 1 tablet (5 mg total) every 6 (six) hours as  needed by mouth for severe pain.     Allergies: Allergies as of 10/07/2017 - Review Complete 10/07/2017  Allergen Reaction Noted  . Adhesive [tape] Hives 10/18/2015  . Latex Hives 05/30/2012  . Oxycodone-acetaminophen Itching and Other (See Comments) 08/18/2014   Past Medical History:  Diagnosis Date  . Anxiety   . Asthma   . Chronic pain    lower back  . COPD (chronic obstructive pulmonary disease) (HCC)   . DDD (degenerative disc disease), cervical   . DDD (degenerative disc disease), lumbar   . Depression   . Fibromyalgia   . Full dentures   . High cholesterol   . Hypertension    states no HTN, is on med. to protect kidneys due to diabetes  . Hypothyroid   . IBS (irritable bowel syndrome)    no current med.  . Insulin dependent diabetes mellitus (HCC)   . Lumbar nerve root impingement   . Neuropathy    hands and feet  . Osteoarthritis   . Sleep apnea    uses CPAP nightly  . Trigger thumb of left hand 09/2015    Family History: Father w/ T2DM  Social History: Lives at home w/ husband and son and is currently disabled. She quit smoking 10 yrs ago but was a former smoker of 61yrs of 1.5ppd. She denies any usage of drugs or alcohol  Review of Systems: A complete ROS was negative except as per HPI.   Physical Exam: Blood pressure (!) 96/56, pulse 83, temperature (!) 102 F (38.9 C), temperature source Oral, resp. rate 14, height 5\' 6"  (1.676 m), weight 95.7 kg, SpO2 95 %. Physical Exam  Constitutional: She is oriented to person, place, and time.  Pleasant Hispanic female in mild distress  HENT:  Head: Normocephalic and atraumatic.  Right Ear: External ear normal.  Left Ear: External ear normal.  Eyes: Conjunctivae are normal.  Neck: Normal range of motion. Neck supple.  Cardiovascular: Normal rate, regular rhythm and intact distal pulses. Exam reveals no gallop and no friction rub.  No murmur heard. Pulmonary/Chest: Effort normal and breath sounds normal. No  respiratory distress. She has no wheezes. She has no rales.  Abdominal: Soft. Bowel sounds are normal. She exhibits distension (secondary to body habitus). There is no tenderness. There is no rebound and no guarding.  Musculoskeletal: Normal range of motion. She exhibits edema (to the bilateral lower extremities, mildly).  Right CVA tenderness to palpation  Neurological: She is alert and oriented to person, place, and time.  Skin: Skin is warm and dry.    EKG: personally reviewed my interpretation is unsustained V-Tach, short PR Interval  Assessment & Plan by Problem: Active Problems:   Pyelonephritis  This is a 57 y.o Hispanic Female w/ a PMHx of Fibromyalgia, OA, HTN, T2DM who presents to the ED w/ possibly pyelonephritis. Currently hospital day  1  SIRS, 2/2 questionable Acute Pyelonephritis. Pt was tachycardic, febrile at 102, and hypotensive in the 80-90s systolic upon admission so several boluses were given along w/ a maintenance rate of 125 cc/hr w/ minimal improvement of her BP to the low 100's systolic. Although pt does not meet the criteria for sepsis since there is no organ damage currently (unremarkable lactic acid, LFTs & Cr), we will have a low threshold since her BP is not responding as well to the fluids given. If she continues to not improve w/ the appropriate fluids, she might need to go to the ICU for pressors. Due to presence of CVA tenderness, elevated temperature and positive UA for an uncomplicated UTI, we suspect the etiology of the SIRS to be from a possible pyelonephritis. The pt denies any Hx of chronic UTI's, but we currently have her on 1g Rocephin for her UTI today. - Continue Rocephin 1g - Await urine cx - Await blood cx - Admit w/ cardiac monitoring  Hx of T2DM. Pt's blood sugar was elevated at 169 upon admission. - D/c home meds - Start Lantus 15 units qpm - Start SSI  Hx of COPD. Pt denies any worsening of her current shortness of breath. - Start home  Proventil 2.5mg  neb q4h PRN - Start home Breo Ellipta 100-19mcg 1 puff  Hx of Hypothyroidism. Pt currently w/o any signs or symptoms of hypothyroidism. - Start home Synthryoid 50 mcg qd  Hx of chronic pain from laminectomy and fibromyalgia - Holding Norco and Embeda at this time to ensure any AMS can clearly be contributed to her infection if it occurs - Also holding flexeril, Meloxicam, and pregabalin  Diet: Clear liquid Code: Full DVT Prophylaxis: Lovenox  Dispo: Admit patient to Inpatient with expected length of stay greater than 2 midnights.  Signed: Wendie Simmer, Medical Student 10/07/2017, 2:34 PM  Pager: [phone]

## 2017-10-07 NOTE — ED Provider Notes (Signed)
[facility] [address]  SURGICAL Provider Note   CSN: [id number] Arrival date & time: 10/07/17  0857     History   Chief Complaint Chief Complaint  Patient presents with  . Influenza  . Flank Pain    HPI [name] is a 57 y.o. female.  Patient presenting with onset of fever on Sunday started to develop fever and chills.  More recently had dysuria.  Nausea no vomiting back pain bilateral flank pain.  Patient has a history of chronic back pain and is followed by pain management.  She states that this is different.  Also associated with fatigue.  No unusual rash.  No blood in the urine no diarrhea no syncope.     Past Medical History:  Diagnosis Date  . Anxiety   . Asthma   . Chronic pain    lower back  . COPD (chronic obstructive pulmonary disease) (HCC)   . DDD (degenerative disc disease), cervical   . DDD (degenerative disc disease), lumbar   . Depression   . Fibromyalgia   . Full dentures   . High cholesterol   . Hypertension    states no HTN, is on med. to protect kidneys due to diabetes  . Hypothyroid   . IBS (irritable bowel syndrome)    no current med.  . Insulin dependent diabetes mellitus (HCC)   . Lumbar nerve root impingement   . Neuropathy    hands and feet  . Osteoarthritis   . Sleep apnea    uses CPAP nightly  . Trigger thumb of left hand 09/2015    Patient Active Problem List   Diagnosis Date Noted  . Pyelonephritis 10/07/2017  . Primary osteoarthritis of left hip 07/10/2014    Past Surgical History:  Procedure Laterality Date  . ABDOMINAL HYSTERECTOMY  2006   partial  . BLADDER SUSPENSION    . CARPAL TUNNEL RELEASE Right 07/22/2001  . CARPAL TUNNEL RELEASE Left 01/05/2003  . CHOLECYSTECTOMY    . COLONOSCOPY W/ POLYPECTOMY    . LUMBAR DISC SURGERY    . MULTIPLE EXTRACTIONS WITH ALVEOLOPLASTY N/A 11/26/2012   Procedure: MULTIPLE EXTRACTION WITH ALVEOLOPLASTY;  Surgeon: [name], DDS;  Location: [facility]  OR;  Service: Oral Surgery;  Laterality: N/A;  . RECTAL PROLAPSE REPAIR    . TOTAL HIP ARTHROPLASTY Left 07/10/2014   Procedure: TOTAL HIP ARTHROPLASTY ANTERIOR APPROACH;  Surgeon: Jodi Geralds, MD;  Location: [facility] OR;  Service: Orthopedics;  Laterality: Left;  . TRIGGER FINGER RELEASE Right    thumb  . TRIGGER FINGER RELEASE Left 10/22/2015   Procedure: LEFT TRIGGER THUMB RELEASE;  Surgeon: Mack Hook, MD;  Location: [facility];  Service: Orthopedics;  Laterality: Left;  . TUBAL LIGATION    . ULNAR NERVE TRANSPOSITION Right 03/27/2014   Procedure: RIGHT ULNAR NEUROPLASTY AT THE ELBOW;  Surgeon: Jodi Marble, MD;  Location: [facility];  Service: Orthopedics;  Laterality: Right;  . ULNAR NERVE TRANSPOSITION Left 12/08/2016   Procedure: LEFT ULNAR NEUROPLASTY AT THE ELBOW;  Surgeon: Mack Hook, MD;  Location: [facility];  Service: Orthopedics;  Laterality: Left;  . UPPER GASTROINTESTINAL ENDOSCOPY       OB History   None      Home Medications    Prior to Admission medications   Medication Sig Start Date End Date Taking? Authorizing Provider  acetaminophen (TYLENOL) 325 MG tablet Take 2 tablets (650 mg total) every 6 (six) hours by mouth. 12/08/16  Yes Mack Hook, MD  albuterol (PROVENTIL HFA;VENTOLIN HFA) 108 (90 Base) MCG/ACT inhaler Inhale every 6 (six) hours as needed into the lungs for wheezing or shortness of breath.   Yes [provider]  atorvastatin (LIPITOR) 10 MG tablet Take 10 mg by mouth daily.   Yes [provider]  cyclobenzaprine (FLEXERIL) 10 MG tablet Take 10 mg by mouth daily as needed for muscle spasms.    Yes [provider]  dapagliflozin propanediol (FARXIGA) 10 MG TABS tablet Take 10 mg daily by mouth.   Yes [provider]  escitalopram (LEXAPRO) 20 MG tablet Take 20 mg by mouth daily.   Yes [provider]  fluticasone furoate-vilanterol (BREO ELLIPTA) 100-25  MCG/INH AEPB Inhale 1 puff into the lungs daily.   Yes [provider]  HYDROcodone-acetaminophen (NORCO) 10-325 MG tablet Take 1 tablet by mouth 2 (two) times daily as needed. 10/01/17  Yes [provider]  insulin degludec (TRESIBA FLEXTOUCH) 100 UNIT/ML SOPN FlexTouch Pen Inject 24 Units into the skin daily at 10 pm.    Yes [provider]  levothyroxine (SYNTHROID, LEVOTHROID) 50 MCG tablet Take 50 mcg by mouth daily before breakfast.   Yes [provider]  lisinopril (PRINIVIL,ZESTRIL) 5 MG tablet Take 2.5 mg by mouth daily.    Yes [provider]  meloxicam (MOBIC) 15 MG tablet Take 1 tablet (15 mg total) daily by mouth. 12/08/16  Yes Mack Hook, MD  metFORMIN (GLUCOPHAGE-XR) 500 MG 24 hr tablet Take 500 mg by mouth 2 (two) times daily. 09/25/17  Yes [provider]  Morphine-Naltrexone (EMBEDA) 60-2.4 MG CPCR Take 1 tablet by mouth daily.    Yes [provider]  pioglitazone (ACTOS) 15 MG tablet Take 15 mg by mouth daily. 09/01/17  Yes [provider]  pregabalin (LYRICA) 200 MG capsule Take 200 mg by mouth 3 (three) times daily.   Yes [provider]  QUEtiapine (SEROQUEL) 100 MG tablet Take 100 mg at bedtime by mouth.   Yes [provider]  rOPINIRole (REQUIP) 0.25 MG tablet Take 0.25 mg by mouth as needed. 09/21/17  Yes [provider]    Family History History reviewed. No pertinent family history.  Social History Social History   Tobacco Use  . Smoking status: Former Smoker    Last attempt to quit: 12/01/2007    Years since quitting: 9.8  . Smokeless tobacco: Never Used  Substance Use Topics  . Alcohol use: No  . Drug use: No     Allergies   Adhesive [tape]; Latex; and Oxycodone-acetaminophen   Review of Systems Review of Systems  Constitutional: Positive for chills, fatigue and fever.  HENT: Negative for congestion.   Respiratory: Negative for shortness of breath.     Cardiovascular: Negative for chest pain.  Gastrointestinal: Positive for nausea. Negative for abdominal pain, diarrhea and vomiting.  Genitourinary: Positive for dysuria and flank pain.  Musculoskeletal: Positive for back pain.  Skin: Negative for rash.  Neurological: Negative for syncope and headaches.  Hematological: Does not bruise/bleed easily.     Physical Exam Updated Vital Signs BP 125/67   Pulse 85   Temp (!) 102 F (38.9 C) (Oral)   Resp 17   Ht 1.676 m (5\' 6" )   Wt 95.7 kg   SpO2 96%   BMI 34.06 kg/m   Physical Exam  Constitutional: She is oriented to person, place, and time. She appears well-developed and well-nourished. No distress.  HENT:  Head: Normocephalic and atraumatic.  Mouth/Throat: Oropharynx is clear and moist.  Eyes: Pupils are equal, round, and reactive to light. Conjunctivae and EOM are normal.  Neck: Neck supple.  Cardiovascular: Regular rhythm and normal heart sounds.  Tachycardic  Pulmonary/Chest: Effort normal and breath sounds normal. No respiratory distress.  Abdominal: Soft. Bowel sounds are normal. There is no tenderness.  Musculoskeletal: Normal range of motion.  Neurological: She is alert and oriented to person, place, and time. No cranial nerve deficit or sensory deficit. She exhibits normal muscle tone. Coordination normal.  Skin: Skin is warm.  Nursing note and vitals reviewed.    ED Treatments / Results  Labs (all labs ordered are listed, but only abnormal results are displayed) Labs Reviewed  COMPREHENSIVE METABOLIC PANEL - Abnormal; Notable for the following components:      Result Value   Glucose, Bld 169 (*)    Albumin 3.3 (*)    All other components within normal limits  CBC WITH DIFFERENTIAL/PLATELET - Abnormal; Notable for the following components:   Hemoglobin 11.1 (*)    HCT 35.6 (*)    Platelets 144 (*)    Lymphs Abs 0.5 (*)    All other components within normal limits  URINALYSIS, ROUTINE W REFLEX MICROSCOPIC  - Abnormal; Notable for the following components:   APPearance HAZY (*)    Nitrite POSITIVE (*)    Leukocytes, UA LARGE (*)    WBC, UA >50 (*)    Bacteria, UA RARE (*)    All other components within normal limits  CBG MONITORING, ED - Abnormal; Notable for the following components:   Glucose-Capillary 101 (*)    All other components within normal limits  CULTURE, BLOOD (ROUTINE X 2)  CULTURE, BLOOD (ROUTINE X 2)  URINE CULTURE  CBC  BASIC METABOLIC PANEL  HIV ANTIBODY (ROUTINE TESTING)  I-STAT CG4 LACTIC ACID, ED  I-STAT CG4 LACTIC ACID, ED  I-STAT CG4 LACTIC ACID, ED    EKG EKG Interpretation  Date/Time:  Wednesday October 07 2017 11:24:36 EDT Ventricular Rate:  95 PR Interval:    QRS Duration: 139 QT Interval:  492 QTC Calculation: 619 R Axis:   138 Text Interpretation:  Sinus tachycardia Ventricular tachycardia, unsustained Short PR interval Consider right atrial enlargement Nonspecific intraventricular conduction delay Probable anterolateral infarct, age indeterm ST elevation, consider inferior injury ST depression V1-V3, suggest recording posterior leads Baseline wander in lead(s) I III aVL Confirmed by Vanetta Mulders [id number]) on 10/07/2017 11:29:36 AM   Radiology No results found.  Procedures Procedures (including critical care time)  CRITICAL CARE Performed by: Vanetta Mulders Total critical care time: 30 minutes Critical care time was exclusive of separately billable procedures and treating other patients. Critical care was necessary to treat or prevent imminent or life-threatening deterioration. Critical care was time spent personally by me on the following activities: development of treatment plan with patient and/or surrogate as well as nursing, discussions with consultants, evaluation of patient's response to treatment, examination of patient, obtaining history from patient or surrogate, ordering and performing treatments and interventions, ordering and  review of laboratory studies, ordering and review of radiographic studies, pulse oximetry and re-evaluation of patient's condition.   Medications Ordered in ED Medications  0.9 %  sodium chloride infusion (1,000 mLs Intravenous New Bag/Given 10/07/17 1210)  cefTRIAXone (ROCEPHIN) 1 g in sodium chloride 0.9 % 100 mL IVPB (0 g Intravenous Stopped 10/07/17 1211)  levothyroxine (SYNTHROID, LEVOTHROID) tablet 50 mcg (has no administration in time range)  fluticasone furoate-vilanterol (BREO ELLIPTA) 100-25 MCG/INH 1  puff (1 puff Inhalation Not Given 10/07/17 1556)  albuterol (PROVENTIL) (2.5 MG/3ML) 0.083% nebulizer solution 2.5 mg (has no administration in time range)  enoxaparin (LOVENOX) injection 40 mg (has no administration in time range)  sodium chloride flush (NS) 0.9 % injection 3 mL (3 mLs Intravenous Given 10/07/17 1351)  acetaminophen (TYLENOL) tablet 650 mg (has no administration in time range)    Or  acetaminophen (TYLENOL) suppository 650 mg (has no administration in time range)  senna-docusate (Senokot-S) tablet 1 tablet (has no administration in time range)  insulin glargine (LANTUS) injection 15 Units (has no administration in time range)  insulin aspart (novoLOG) injection 0-5 Units (has no administration in time range)  insulin aspart (novoLOG) injection 0-15 Units (0 Units Subcutaneous Not Given 10/07/17 1707)  acetaminophen (TYLENOL) tablet 650 mg (650 mg Oral Given 10/07/17 1210)  sodium chloride 0.9 % bolus 1,000 mL (0 mLs Intravenous Stopped 10/07/17 1444)     Initial Impression / Assessment and Plan / ED Course  I have reviewed the triage vital signs and the nursing notes.  Pertinent labs & imaging results that were available during my care of the patient were reviewed by me and considered in my medical decision making (see chart for details).    Patient with significant fever and chills.  Tachycardic.  Temp to 102.6 upon arrival and stayed up despite Tylenol at 102.   Patient's tachycardia did improve.  Never was hypotensive.  Patient upon presentation did meet sepsis criteria.  Clinically it sounded as if it was most likely going to be urinary source like pyelonephritis.  Urine was abnormal.  So patient's antibiotics were directed towards that she received Rocephin.  Patient did not receive or require the full 30 cc/kg bolus.  Patient remained hemodynamically stable but still was fairly febrile.  Patient be admitted by internal medicine service.  At least overnight to make sure that she improves.  Urine culture sent blood culture sent.  Clinically and based on the urinalysis consistent with a pyelonephritis and she has the bilateral flank pain and some back pain as well.  Patient has a history of chronic pain but that was not contributing to her presentation today.   Final Clinical Impressions(s) / ED Diagnoses   Final diagnoses:  Pyelonephritis  Fever, unspecified fever cause    ED Discharge Orders    None       Vanetta Mulders, MD 10/07/17 1843

## 2017-10-07 NOTE — ED Triage Notes (Signed)
PT reports shaking for several days and pain across back. Hx of kidney stones.

## 2017-10-07 NOTE — ED Notes (Signed)
Attempted to call report, nurse had just gotten a new pt, will try again in a few minutes.

## 2017-10-08 LAB — BASIC METABOLIC PANEL
Anion gap: 9 (ref 5–15)
BUN: 8 mg/dL (ref 6–20)
CO2: 22 mmol/L (ref 22–32)
CREATININE: 0.73 mg/dL (ref 0.44–1.00)
Calcium: 8.4 mg/dL — ABNORMAL LOW (ref 8.9–10.3)
Chloride: 107 mmol/L (ref 98–111)
GFR calc Af Amer: >60 mL/min (ref >60)
GLUCOSE: 129 mg/dL — AB (ref 70–99)
POTASSIUM: 3.5 mmol/L (ref 3.5–5.1)
Sodium: 138 mmol/L (ref 135–145)

## 2017-10-08 LAB — CBC
HCT: 31.9 % — ABNORMAL LOW (ref 36.0–46.0)
Hemoglobin: 10.2 g/dL — ABNORMAL LOW (ref 12.0–15.0)
MCH: 28.8 pg (ref 26.0–34.0)
MCHC: 32 g/dL (ref 30.0–36.0)
MCV: 90.1 fL (ref 78.0–100.0)
PLATELETS: 128 10*3/uL — AB (ref 150–400)
RBC: 3.54 MIL/uL — AB (ref 3.87–5.11)
RDW: 14.4 % (ref 11.5–15.5)
WBC: 5 10*3/uL (ref 4.0–10.5)

## 2017-10-08 LAB — URINE CULTURE: Culture: <10000 — AB

## 2017-10-08 LAB — GLUCOSE, CAPILLARY
GLUCOSE-CAPILLARY: 168 mg/dL — AB (ref 70–99)
Glucose-Capillary: 102 mg/dL — ABNORMAL HIGH (ref 70–99)
Glucose-Capillary: 105 mg/dL — ABNORMAL HIGH (ref 70–99)
Glucose-Capillary: 112 mg/dL — ABNORMAL HIGH (ref 70–99)

## 2017-10-08 LAB — HIV ANTIBODY (ROUTINE TESTING W REFLEX): HIV Screen 4th Generation wRfx: NONREACTIVE

## 2017-10-08 MED ORDER — ROPINIROLE HCL 0.5 MG PO TABS
0.2500 mg | ORAL_TABLET | ORAL | Status: DC | PRN
  Administered 2017-10-08: 0.25 mg via ORAL
  Filled 2017-10-08: qty 1

## 2017-10-08 MED ORDER — QUETIAPINE FUMARATE 100 MG PO TABS: 100.0000 mg | ORAL_TABLET | Freq: Every day | ORAL | Status: DC

## 2017-10-08 MED ORDER — QUETIAPINE FUMARATE 100 MG PO TABS
100.0000 mg | ORAL_TABLET | Freq: Once | ORAL | Status: AC
  Administered 2017-10-08: 100 mg via ORAL
  Filled 2017-10-08: qty 1

## 2017-10-08 NOTE — Progress Notes (Signed)
Patient Inspiratory flow checked. Pt reached 32 L/Min. Inspiratory flow adequate for effective medication administration.

## 2017-10-08 NOTE — Progress Notes (Signed)
Subjective:  [name] is a 57 y.o Hispanic Female w/ a PMHx of HTN, Dyslipidemia, T2DM, Neuropathy, COPD, Fibromyalgia, OA & Chronic pain that presented to our service for acute pyelonephritis, currently on hospital day 2.  I have evaluated and examined [name] at bedside this am. She was lying on her left side in moderate distress and reported to me that she did not have a good night of sleep last night. She reports that she is still experiencing pain in her R mid back & SP region, HA, dysuria, urgency, nausea, fevers and chills. She was able to tolerate fluid intake, but not solid food yet. I informed her of the plans to continue to monitor her vital signs and that we would d/c her telemetry monitor since she has improved compared to yesterday.  Objective:  Vital signs in last 24 hours: Vitals:   10/07/17 2151 10/07/17 2212 10/08/17 0606 10/08/17 1047  BP: 107/60  (!) 118/52   Pulse: 80  85 70  Resp: 18  18 16   Temp: (!) 101.3 F (38.5 C) 100.2 F (37.9 C) (!) 100.4 F (38 C)   TempSrc: Oral Oral Oral   SpO2: 95%  91% 95%  Weight:      Height:       Physical Exam  Constitutional:  Hispanic female in moderate distress but cooperative throughout the encounter  HENT:  Head: Normocephalic and atraumatic.  Eyes: Conjunctivae and EOM are normal.  Neck: Normal range of motion. Neck supple.  Cardiovascular: Normal rate, regular rhythm and intact distal pulses. Exam reveals no gallop and no friction rub.  No murmur heard. Pulmonary/Chest: Effort normal and breath sounds normal. No respiratory distress. She has no wheezes. She has no rales. She exhibits no tenderness.  Abdominal: Soft. Bowel sounds are normal. She exhibits distension (secondary to body habitus). There is tenderness (in the suprapubic region). There is no rebound and no guarding.  Musculoskeletal: Normal range of motion. She exhibits edema (in the bilateral lower extremities, mildly).  Positive R CVA Tenderness  to palpation  Neurological: She is alert.  Skin: Skin is warm and dry.    Assessment/Plan:  Active Problems:   Pyelonephritis  [name] is a 57 y.o Hispanic Female w/ a PMHx of HTN, Dyslipidemia, T2DM, Neuropathy, COPD, Fibromyalgia, OA & Chronic pain who presented w/ acute pyelonephritis that is improved compared to yesterday.  Acute Pyelonephritis, 2/2 uncomplicated UTI. Pt no longer meets SIRS criteria since tachycardia has resolved and although pt is still febrile and spiked a fever of 101.3 overnight, her temperature is improving and was 100.4 as of 0600 today. Pt's blood pressure continues to be stable around the 120's systolic while on IV fluids. Pt has clinically improved compared to yesterday and was able to tolerate some fluids today but not solid food yet. - Continue IV Rocephin - Await Urine Cx - Await Blood Cx - D/c tele monitor - Monitor if pt is able to tolerate PO intake - Continue to monitor VS  Hx of T2DM. Pt's blood sugar has improved from 169 yesterday to 129 today - Continue Lantus 15 units qpm - Continue SSI  Hx of COPD. Pt still dyspneic, but not any worse than her baseline SOB - Continue home Proventil 2.5mg  neb q4h PRN - Continue home Breo Ellipta 100-7125mcg 1 puff  Hx of Hypothyroidism. Pt still w/o any signs or symptoms of hypothyroidism. - Continue home Synthryoid 50 mcg qd  Hx of chronic pain from laminectomy and fibromyalgia -  Continue to hold Norco and Embeda to ensure any AMS can clearly be contributed to her infection if it occurs - Continue to hold flexeril, Meloxicam, and pregabalin  Dispo: Anticipated discharge in approximately 2-3 days   Wendie Simmer, Medical Student 10/08/2017, 11:56 AM Pager: @[phone]@

## 2017-10-08 NOTE — Plan of Care (Signed)
  Problem: Clinical Measurements: Goal: Respiratory complications will improve Outcome: Progressing Goal: Cardiovascular complication will be avoided Outcome: Progressing   Problem: Nutrition: Goal: Adequate nutrition will be maintained Outcome: Progressing   

## 2017-10-09 LAB — CBC
HEMATOCRIT: 32.1 % — AB (ref 36.0–46.0)
Hemoglobin: 10.3 g/dL — ABNORMAL LOW (ref 12.0–15.0)
MCH: 28.9 pg (ref 26.0–34.0)
MCHC: 32.1 g/dL (ref 30.0–36.0)
MCV: 89.9 fL (ref 78.0–100.0)
PLATELETS: 132 10*3/uL — AB (ref 150–400)
RBC: 3.57 MIL/uL — ABNORMAL LOW (ref 3.87–5.11)
RDW: 14.3 % (ref 11.5–15.5)
WBC: 5.1 10*3/uL (ref 4.0–10.5)

## 2017-10-09 LAB — BASIC METABOLIC PANEL
ANION GAP: 8 (ref 5–15)
BUN: 6 mg/dL (ref 6–20)
CALCIUM: 8.6 mg/dL — AB (ref 8.9–10.3)
CO2: 25 mmol/L (ref 22–32)
CREATININE: 0.67 mg/dL (ref 0.44–1.00)
Chloride: 110 mmol/L (ref 98–111)
GFR calc Af Amer: >60 mL/min (ref >60)
Glucose, Bld: 134 mg/dL — ABNORMAL HIGH (ref 70–99)
Potassium: 3.7 mmol/L (ref 3.5–5.1)
Sodium: 143 mmol/L (ref 135–145)

## 2017-10-09 LAB — GLUCOSE, CAPILLARY
GLUCOSE-CAPILLARY: 136 mg/dL — AB (ref 70–99)
Glucose-Capillary: 167 mg/dL — ABNORMAL HIGH (ref 70–99)

## 2017-10-09 MED ORDER — CEFDINIR 300 MG PO CAPS: 300.0000 mg | ORAL_CAPSULE | Freq: Two times a day (BID) | ORAL | 0 refills | Status: DC

## 2017-10-09 NOTE — Progress Notes (Addendum)
Subjective:  [name] is a 56 y.o Hispanic Female w/ a PMHx of HTN, Dyslipidemia, T2DM, Neuropathy, COPD, Fibromyalgia, OA & Chronic pain that presented to our service for acute pyelonephritis, currently on hospital day 3.  I have evaluated and examined [name] at bedside this am. She was resting on her bed in very mild distress. She states that she was unable to sleep well due to noises in the hospital. She also reports that her HA and R back pain are still present, but they are much improved compared to yesterday. She no longer endorses any SP pain or nausea. We updated her of the plans to discharge her since her VS were stable and she was able to tolerate her breakfast with no vomiting.   Objective:  Vital signs in last 24 hours: Vitals:   10/08/17 1552 10/08/17 2033 10/09/17 0558 10/09/17 0925  BP: (!) 107/43 (!) 117/53 132/66   Pulse: (!) 58 79 69   Resp: 18 18 18    Temp: 99 F (37.2 C) 99.3 F (37.4 C) 100.1 F (37.8 C)   TempSrc: Oral Oral Oral   SpO2: 97% 95% 95% 95%  Weight:      Height:       Physical Exam  Constitutional: She is well-developed, well-nourished, and in no distress.  HENT:  Head: Normocephalic and atraumatic.  Right Ear: External ear normal.  Left Ear: External ear normal.  Eyes: Conjunctivae and EOM are normal.  Neck: Normal range of motion. Neck supple.  Cardiovascular: Normal rate, regular rhythm, normal heart sounds and intact distal pulses. Exam reveals no gallop and no friction rub.  No murmur heard. Pulmonary/Chest: Effort normal and breath sounds normal. No respiratory distress. She has no wheezes. She has no rales.  Abdominal: Soft. Bowel sounds are normal. She exhibits no distension and no mass. There is no tenderness. There is no rebound and no guarding.  Musculoskeletal: Normal range of motion. She exhibits edema (very mild amount to her bilateral lower extremities).  Mild R CVA tenderness to palpation  Neurological: She is alert.   Skin: Skin is warm and dry.    Assessment/Plan:  Active Problems:   Pyelonephritis  [name] is a 57 y.o Hispanic Female w/ a PMHx of HTN, Dyslipidemia, T2DM, Neuropathy, COPD, Fibromyalgia, OA & Chronic pain who presented w/ acute pyelonephritis that is much improved compared to yesterday and tolerating PO intake. Currently hospital day 3  Acute Pyelonephritis, 2/2 uncomplicated UTI. Pt continues to have stable vital signs with no temperature, tachycardia or hypotension and able to tolerate her breakfast with no nausea. She is currently stable for discharge. - D/c w/ outpt PO Cefdinir 7 days for a total duration of abx usage of 10 days for pyelo coverage  RLS. Pt unable to sleep last night due to her RLS so overnight team started her home ropinirole.  Hx of T2DM. Pt's blood sugar remains stable at 134.   Hx of COPD. Pt no longer reports dyspnea  Hx of Hypothyroidism. Pt still w/o any signs or symptoms of hypothyroidism.  Hx of chronic pain from laminectomy and fibromyalgia - Continue to hold Norco and Embeda to ensure any AMS can clearly be contributed to her infection if it occurs - Continue to hold flexeril, Meloxicam, and pregabalin  Dispo: Anticipated discharge today  Wendie Simmer, Medical Student 10/09/2017, 1:16 PM Pager: [phone]  Attestation for Student Documentation:  I personally was present and performed or re-performed the history, physical exam and medical decision-making activities  of this service and have verified that the service and findings are accurately documented in the student's note with some additions/corrections.  Beola CordMelvin, Alexander, MD 10/09/2017, 3:37 PM

## 2017-10-09 NOTE — Discharge Summary (Signed)
Name: [name] MRN: [id number] DOB: 1960/10/18 57 y.o. PCP: Imagene Riches, NP  Date of Admission: 10/07/2017  9:22 AM Date of Discharge: 10/09/2017 Attending Physician: Bartholomew Crews, MD  Discharge Diagnosis: 1. Acute Pyelonephritis, improved  Discharge Medications: Allergies as of 10/09/2017      Reactions   Adhesive [tape] Hives   Latex Hives   Oxycodone-acetaminophen Itching, Other (See Comments)   Other reaction(s): Itching, Other (See Comments) ask Other reaction(s): Other (See Comments) ask ask ask      Medication List    TAKE these medications   acetaminophen 325 MG tablet Commonly known as:  TYLENOL Take 2 tablets (650 mg total) every 6 (six) hours by mouth.   albuterol 108 (90 Base) MCG/ACT inhaler Commonly known as:  PROVENTIL HFA;VENTOLIN HFA Inhale every 6 (six) hours as needed into the lungs for wheezing or shortness of breath.   atorvastatin 10 MG tablet Commonly known as:  LIPITOR Take 10 mg by mouth daily.   BREO ELLIPTA 100-25 MCG/INH Aepb Generic drug:  fluticasone furoate-vilanterol Inhale 1 puff into the lungs daily.   cefdinir 300 MG capsule Commonly known as:  OMNICEF Take 1 capsule (300 mg total) by mouth 2 (two) times daily.   cyclobenzaprine 10 MG tablet Commonly known as:  FLEXERIL Take 10 mg by mouth daily as needed for muscle spasms.   EMBEDA 60-2.4 MG Cpcr Generic drug:  Morphine-Naltrexone Take 1 tablet by mouth daily.   escitalopram 20 MG tablet Commonly known as:  LEXAPRO Take 20 mg by mouth daily.   FARXIGA 10 MG Tabs tablet Generic drug:  dapagliflozin propanediol Take 10 mg daily by mouth.   HYDROcodone-acetaminophen 10-325 MG tablet Commonly known as:  NORCO Take 1 tablet by mouth 2 (two) times daily as needed.   levothyroxine 50 MCG tablet Commonly known as:  SYNTHROID, LEVOTHROID Take 50 mcg by mouth daily before breakfast.   lisinopril 5 MG tablet Commonly known as:   PRINIVIL,ZESTRIL Take 2.5 mg by mouth daily.   meloxicam 15 MG tablet Commonly known as:  MOBIC Take 1 tablet (15 mg total) daily by mouth.   metFORMIN 500 MG 24 hr tablet Commonly known as:  GLUCOPHAGE-XR Take 500 mg by mouth 2 (two) times daily.   pioglitazone 15 MG tablet Commonly known as:  ACTOS Take 15 mg by mouth daily.   pregabalin 200 MG capsule Commonly known as:  LYRICA Take 200 mg by mouth 3 (three) times daily.   QUEtiapine 100 MG tablet Commonly known as:  SEROQUEL Take 100 mg at bedtime by mouth.   rOPINIRole 0.25 MG tablet Commonly known as:  REQUIP Take 0.25 mg by mouth as needed.   TRESIBA FLEXTOUCH 100 UNIT/ML Sopn FlexTouch Pen Generic drug:  insulin degludec Inject 24 Units into the skin daily at 10 pm.       Disposition and follow-up:   [name] was discharged from [facility] in Stable condition.  At the hospital follow up visit please address:  1.  Pyelonephritis - Ensure resolution of symptoms - Ensure patient able to pickup and has complete course of Antibiotics  2.  Labs / imaging needed at time of follow-up: none  3.  Pending labs/ test needing follow-up: none  Follow-up Appointments: Follow-up Information    Imagene Riches, NP. Call in 1 week(s).   Why:  Please call your PCP and make an appointment in 1-2 weeks Contact information: [address] [place] [address] [phone]  Hospital Course by problem list: 1. Acute Pyelonephritis 2/2 Uncomplicated UTI. Ms. [name] presented to the ED on Wedesday October 07 2017 with intermittent pain to her R mid back for the past week. In addition to the pain, she endorsed fever, chills, decrease PO intake, weakness, dysuria, frequency and a HA. She met SIRS criteria upon admission due to her tachycardia & elevated temperature and also found to be hypotensive in the 24Q systolic which eventually improved with fluids. She  was started on Rocephin for her UTI and was admitted to our teaching service for Acute Pyelonephritis. We continued her Rocephin throughout her entire admission of 3 days. After 3 days, her vitals were stable and she was able to tolerate PO intake so we discharged her home with a 7 day script of PO Cefdinir for a total duration abx usage of 10 days to cover her pyelonephritis.  Discharge Vitals:   BP 129/66 (BP Location: Right Arm)   Pulse 65   Temp 99.7 F (37.6 C) (Oral)   Resp 16   Ht _0  (1.676 m)   Wt 95.7 kg   SpO2 98%   BMI 34.06 kg/m   Pertinent Labs, Studies, and Procedures:   Discharge Instructions: Discharge Instructions    Call MD for:  difficulty breathing, headache or visual disturbances   Complete by:  As directed    Call MD for:  persistant dizziness or light-headedness   Complete by:  As directed    Call MD for:  persistant nausea and vomiting   Complete by:  As directed    Call MD for:  temperature >100.4   Complete by:  As directed    Diet - low sodium heart healthy   Complete by:  As directed    Discharge instructions   Complete by:  As directed    Thank you for allowing [place] to care for you  Your symptoms were due to pyelonephritis, a urinary tract infection that had spread to your kidney. - You are being discharge with an additional 7 days of antibiotics to begin on 9/14 - Please take all prescribed anabiotics  Please follow up with your PCP in the next 1 week for hospital follow up.   Increase activity slowly   Complete by:  As directed     Thank you for allowing [place] to care for you during your stay at [facility]. Please call your PCP to schedule an appointment in 1-2 weeks  Signed: Trixie Rude OMS-IV 10/11/2017, 2:25 PM   Pager: _[phone]  Pearson Grippe, DO IM PGY-2 Pager: [phone]

## 2017-10-09 NOTE — Progress Notes (Signed)
Patient discharged to home. Verbalizes understanding of all discharge instructions including discharge medications and follow up MD visits. Patient accompanied by son and spouse.

## 2017-10-09 NOTE — Plan of Care (Signed)
  Problem: Clinical Measurements: Goal: Ability to maintain clinical measurements within normal limits will improve Outcome: Progressing   Problem: Activity: Goal: Risk for activity intolerance will decrease Outcome: Progressing   Problem: Elimination: Goal: Will not experience complications related to bowel motility Outcome: Progressing   

## 2017-10-09 NOTE — Discharge Instructions (Signed)
Pyelonephritis, Adult Pyelonephritis is a kidney infection. The kidneys are the organs that filter a person's blood and move waste out of the bloodstream and into the urine. Urine passes from the kidneys, through the ureters, and into the bladder. There are two main types of pyelonephritis:  Infections that come on quickly without any warning (acute pyelonephritis).  Infections that last for a long period of time (chronic pyelonephritis).  In most cases, the infection clears up with treatment and does not cause further problems. More severe infections or chronic infections can sometimes spread to the bloodstream or lead to other problems with the kidneys. What are the causes? This condition is usually caused by:  Bacteria traveling from the bladder to the kidney through infected urine. The urine in the bladder can become infected with bacteria from: ? Bladder infection (cystitis). ? Inflammation of the prostate gland (prostatitis). ? Sexual intercourse, in females.  Bacteria traveling from the bloodstream to the kidney.  What increases the risk? This condition is more likely to develop in:  Pregnant women.  Older people.  People who have diabetes.  People who have kidney stones or bladder stones.  People who have other abnormalities of the kidney or ureter.  People who have a catheter placed in the bladder.  People who have cancer.  People who are sexually active.  Women who use spermicides.  People who have had a prior urinary tract infection.  What are the signs or symptoms? Symptoms of this condition include:  Frequent urination.  Strong or persistent urge to urinate.  Burning or stinging when urinating.  Abdominal pain.  Back pain.  Pain in the side or flank area.  Fever.  Chills.  Blood in the urine, or dark urine.  Nausea.  Vomiting.  How is this diagnosed? This condition may be diagnosed based on:  Medical history and physical exam.  Urine  tests.  Blood tests.  You may also have imaging tests of the kidneys, such as an ultrasound or CT scan. How is this treated? Treatment for this condition may depend on the severity of the infection.  If the infection is mild and is found early, you may be treated with antibiotic medicines taken by mouth. You will need to drink fluids to remain hydrated.  If the infection is more severe, you may need to stay in the hospital and receive antibiotics given directly into a vein through an IV tube. You may also need to receive fluids through an IV tube if you are not able to remain hydrated. After your hospital stay, you may need to take oral antibiotics for a period of time.  Other treatments may be required, depending on the cause of the infection. Follow these instructions at home: Medicines  Take over-the-counter and prescription medicines only as told by your health care provider.  If you were prescribed an antibiotic medicine, take it as told by your health care provider. Do not stop taking the antibiotic even if you start to feel better. General instructions  Drink enough fluid to keep your urine clear or pale yellow.  Avoid caffeine, tea, and carbonated beverages. They tend to irritate the bladder.  Urinate often. Avoid holding in urine for long periods of time.  Urinate before and after sex.  After a bowel movement, women should cleanse from front to back. Use each tissue only once.  Keep all follow-up visits as told by your health care provider. This is important. Contact a health care provider if:  Your symptoms   do not get better after 2 days of treatment.  Your symptoms get worse.  You have a fever. Get help right away if:  You are unable to take your antibiotics or fluids.  You have shaking chills.  You vomit.  You have severe flank or back pain.  You have extreme weakness or fainting. This information is not intended to replace advice given to you by your  health care provider. Make sure you discuss any questions you have with your health care provider. Document Released: 01/13/2005 Document Revised: 06/21/2015 Document Reviewed: 05/08/2014 Elsevier Interactive Patient Education  2018 [employer]  

## 2017-10-12 LAB — CULTURE, BLOOD (ROUTINE X 2)
Culture: NO GROWTH
Culture: NO GROWTH

## 2018-08-05 ENCOUNTER — Other Ambulatory Visit: Payer: Self-pay | Admitting: Orthopedic Surgery

## 2018-08-07 ENCOUNTER — Other Ambulatory Visit (HOSPITAL_COMMUNITY)
Admission: RE | Admit: 2018-08-07 | Discharge: 2018-08-07 | Disposition: A | Discharge status: Home / Self Care | Payer: Medicaid Other | Source: Ambulatory Visit | Attending: Orthopedic Surgery | Admitting: Orthopedic Surgery

## 2018-08-07 DIAGNOSIS — Z1159 Encounter for screening for other viral diseases: Secondary | ICD-10-CM | POA: Insufficient documentation

## 2018-08-07 DIAGNOSIS — Z01812 Encounter for preprocedural laboratory examination: Secondary | ICD-10-CM | POA: Diagnosis present

## 2018-08-07 LAB — SARS CORONAVIRUS 2 (TAT 6-24 HRS): SARS Coronavirus 2: NEGATIVE

## 2018-08-10 ENCOUNTER — Encounter (HOSPITAL_BASED_OUTPATIENT_CLINIC_OR_DEPARTMENT_OTHER): Payer: Self-pay | Admitting: *Deleted

## 2018-08-10 ENCOUNTER — Other Ambulatory Visit: Payer: Self-pay

## 2018-08-10 NOTE — Progress Notes (Signed)
Spoke w/ pt via phone for pre-op interview.  Npo after mn w/ exception clear liquid diet until 1030 then nothing by mouth, pt verbalized understanding.  Arrive at [facility].  Need istat. Current ekg in chart and epic but Ask mda if EKG need to be repeated.  Pt had covid test done 08-07-2018.  Will take prozac, synthroid, lyrica, morphine, and do breo inhaler am dos w/ sips of water.  Ask pt to bring rescue inhaler.

## 2018-08-10 NOTE — Progress Notes (Signed)
SPOKE W/  _ pt via phone     [place] 19:  Per pt had covid 15 Jun 2018-- recovered at home without supplemental oxygen, no hospital admission,  Residual nasal congestion and loss of taste is coming back.  Covid test done 08-07-2018 for surgery 08-11-2018, Negative  COUGH--  RUNNY NOSE---   SORE THROAT---  NASAL CONGESTION----  SNEEZING----  SHORTNESS OF BREATH---  DIFFICULTY BREATHING---  TEMP >100.0 -----  UNEXPLAINED BODY ACHES------  CHILLS --------   HEADACHES ---------  LOSS OF SMELL/ TASTE --------    HAVE YOU OR ANY FAMILY MEMBER TRAVELLED PAST 14 DAYS OUT OF THE   COUNTY---  no STATE---- no COUNTRY---- no  HAVE YOU OR ANY FAMILY MEMBER BEEN EXPOSED TO ANYONE WITH COVID 19?    denies

## 2018-08-11 ENCOUNTER — Ambulatory Visit (HOSPITAL_BASED_OUTPATIENT_CLINIC_OR_DEPARTMENT_OTHER)
Admission: RE | Admit: 2018-08-11 | Discharge: 2018-08-11 | Disposition: A | Discharge status: Home / Self Care | Payer: Medicaid Other | Attending: Orthopedic Surgery | Admitting: Orthopedic Surgery

## 2018-08-11 ENCOUNTER — Encounter (HOSPITAL_BASED_OUTPATIENT_CLINIC_OR_DEPARTMENT_OTHER)
Admission: RE | Disposition: A | Discharge status: Home / Self Care | Payer: Self-pay | Source: Home / Self Care | Attending: Orthopedic Surgery

## 2018-08-11 ENCOUNTER — Encounter (HOSPITAL_BASED_OUTPATIENT_CLINIC_OR_DEPARTMENT_OTHER): Payer: Self-pay

## 2018-08-11 ENCOUNTER — Other Ambulatory Visit: Payer: Self-pay

## 2018-08-11 ENCOUNTER — Ambulatory Visit (HOSPITAL_BASED_OUTPATIENT_CLINIC_OR_DEPARTMENT_OTHER): Payer: Medicaid Other | Admitting: Certified Registered"

## 2018-08-11 DIAGNOSIS — K589 Irritable bowel syndrome without diarrhea: Secondary | ICD-10-CM | POA: Insufficient documentation

## 2018-08-11 DIAGNOSIS — Z888 Allergy status to other drugs, medicaments and biological substances status: Secondary | ICD-10-CM | POA: Insufficient documentation

## 2018-08-11 DIAGNOSIS — M75122 Complete rotator cuff tear or rupture of left shoulder, not specified as traumatic: Secondary | ICD-10-CM | POA: Diagnosis present

## 2018-08-11 DIAGNOSIS — X58XXXA Exposure to other specified factors, initial encounter: Secondary | ICD-10-CM | POA: Diagnosis not present

## 2018-08-11 DIAGNOSIS — Z7984 Long term (current) use of oral hypoglycemic drugs: Secondary | ICD-10-CM | POA: Insufficient documentation

## 2018-08-11 DIAGNOSIS — Z7951 Long term (current) use of inhaled steroids: Secondary | ICD-10-CM | POA: Diagnosis not present

## 2018-08-11 DIAGNOSIS — S43432A Superior glenoid labrum lesion of left shoulder, initial encounter: Secondary | ICD-10-CM

## 2018-08-11 DIAGNOSIS — E039 Hypothyroidism, unspecified: Secondary | ICD-10-CM | POA: Insufficient documentation

## 2018-08-11 DIAGNOSIS — Z885 Allergy status to narcotic agent status: Secondary | ICD-10-CM | POA: Insufficient documentation

## 2018-08-11 DIAGNOSIS — G894 Chronic pain syndrome: Secondary | ICD-10-CM | POA: Insufficient documentation

## 2018-08-11 DIAGNOSIS — M7542 Impingement syndrome of left shoulder: Secondary | ICD-10-CM | POA: Diagnosis present

## 2018-08-11 DIAGNOSIS — J449 Chronic obstructive pulmonary disease, unspecified: Secondary | ICD-10-CM | POA: Insufficient documentation

## 2018-08-11 DIAGNOSIS — Z791 Long term (current) use of non-steroidal anti-inflammatories (NSAID): Secondary | ICD-10-CM | POA: Insufficient documentation

## 2018-08-11 DIAGNOSIS — Z79899 Other long term (current) drug therapy: Secondary | ICD-10-CM | POA: Insufficient documentation

## 2018-08-11 DIAGNOSIS — M19012 Primary osteoarthritis, left shoulder: Secondary | ICD-10-CM | POA: Diagnosis present

## 2018-08-11 DIAGNOSIS — Z87891 Personal history of nicotine dependence: Secondary | ICD-10-CM | POA: Insufficient documentation

## 2018-08-11 DIAGNOSIS — F419 Anxiety disorder, unspecified: Secondary | ICD-10-CM | POA: Insufficient documentation

## 2018-08-11 DIAGNOSIS — Z9049 Acquired absence of other specified parts of digestive tract: Secondary | ICD-10-CM | POA: Insufficient documentation

## 2018-08-11 DIAGNOSIS — M545 Low back pain: Secondary | ICD-10-CM | POA: Diagnosis not present

## 2018-08-11 DIAGNOSIS — E1142 Type 2 diabetes mellitus with diabetic polyneuropathy: Secondary | ICD-10-CM | POA: Diagnosis not present

## 2018-08-11 DIAGNOSIS — Z86010 Personal history of colonic polyps: Secondary | ICD-10-CM | POA: Diagnosis not present

## 2018-08-11 DIAGNOSIS — F329 Major depressive disorder, single episode, unspecified: Secondary | ICD-10-CM | POA: Diagnosis not present

## 2018-08-11 DIAGNOSIS — M797 Fibromyalgia: Secondary | ICD-10-CM | POA: Diagnosis not present

## 2018-08-11 DIAGNOSIS — I10 Essential (primary) hypertension: Secondary | ICD-10-CM | POA: Insufficient documentation

## 2018-08-11 DIAGNOSIS — M5136 Other intervertebral disc degeneration, lumbar region: Secondary | ICD-10-CM | POA: Diagnosis not present

## 2018-08-11 DIAGNOSIS — Z91040 Latex allergy status: Secondary | ICD-10-CM | POA: Diagnosis not present

## 2018-08-11 DIAGNOSIS — Z6833 Body mass index (BMI) 33.0-33.9, adult: Secondary | ICD-10-CM | POA: Insufficient documentation

## 2018-08-11 DIAGNOSIS — Z91048 Other nonmedicinal substance allergy status: Secondary | ICD-10-CM | POA: Insufficient documentation

## 2018-08-11 DIAGNOSIS — M1990 Unspecified osteoarthritis, unspecified site: Secondary | ICD-10-CM | POA: Insufficient documentation

## 2018-08-11 DIAGNOSIS — G4733 Obstructive sleep apnea (adult) (pediatric): Secondary | ICD-10-CM | POA: Insufficient documentation

## 2018-08-11 HISTORY — DX: Obstructive sleep apnea (adult) (pediatric): G47.33

## 2018-08-11 HISTORY — DX: Chronic pain syndrome: G89.4

## 2018-08-11 HISTORY — DX: Hypothyroidism, unspecified: E03.9

## 2018-08-11 HISTORY — DX: Pain in left shoulder: M25.512

## 2018-08-11 HISTORY — DX: Personal history of COVID-19: Z86.16

## 2018-08-11 HISTORY — DX: Postlaminectomy syndrome, not elsewhere classified: M96.1

## 2018-08-11 HISTORY — DX: Complete rotator cuff tear or rupture of left shoulder, not specified as traumatic: M75.122

## 2018-08-11 HISTORY — DX: Primary osteoarthritis, left shoulder: M19.012

## 2018-08-11 HISTORY — PX: SHOULDER ARTHROSCOPY WITH ROTATOR CUFF REPAIR AND SUBACROMIAL DECOMPRESSION: SHX5686

## 2018-08-11 HISTORY — DX: Impingement syndrome of left shoulder: M75.42

## 2018-08-11 HISTORY — DX: Superior glenoid labrum lesion of left shoulder, initial encounter: S43.432A

## 2018-08-11 LAB — POCT I-STAT 4, (NA,K, GLUC, HGB,HCT)
Glucose, Bld: 112 mg/dL — ABNORMAL HIGH (ref 70–99)
HCT: 36 % (ref 36.0–46.0)
Hemoglobin: 12.2 g/dL (ref 12.0–15.0)
Potassium: 3.6 mmol/L (ref 3.5–5.1)
Sodium: 142 mmol/L (ref 135–145)

## 2018-08-11 LAB — GLUCOSE, CAPILLARY: Glucose-Capillary: 132 mg/dL — ABNORMAL HIGH (ref 70–99)

## 2018-08-11 SURGERY — SHOULDER ARTHROSCOPY WITH ROTATOR CUFF REPAIR AND SUBACROMIAL DECOMPRESSION
Anesthesia: General | Site: Shoulder | Laterality: Left

## 2018-08-11 MED ORDER — CYCLOBENZAPRINE HCL 10 MG PO TABS: 10.0000 mg | ORAL_TABLET | Freq: Three times a day (TID) | ORAL | 0 refills | Status: AC | PRN

## 2018-08-11 MED ORDER — ONDANSETRON HCL 4 MG/2ML IJ SOLN
INTRAMUSCULAR | Status: DC | PRN
  Administered 2018-08-11: 4 mg via INTRAVENOUS

## 2018-08-11 MED ORDER — ROCURONIUM BROMIDE 10 MG/ML (PF) SYRINGE
PREFILLED_SYRINGE | INTRAVENOUS | Status: AC
  Filled 2018-08-11: qty 10

## 2018-08-11 MED ORDER — DEXAMETHASONE SODIUM PHOSPHATE 10 MG/ML IJ SOLN
INTRAMUSCULAR | Status: DC | PRN
  Administered 2018-08-11: 5 mg via INTRAVENOUS

## 2018-08-11 MED ORDER — HYDROMORPHONE HCL 1 MG/ML IJ SOLN
0.2500 mg | INTRAMUSCULAR | Status: DC | PRN
  Filled 2018-08-11: qty 0.5

## 2018-08-11 MED ORDER — SODIUM CHLORIDE 0.9 % IR SOLN
Status: DC | PRN
  Administered 2018-08-11: 3000 mL
  Administered 2018-08-11 (×2): 6000 mL
  Administered 2018-08-11: 3000 mL

## 2018-08-11 MED ORDER — HYDROMORPHONE HCL 2 MG PO TABS: 2.0000 mg | ORAL_TABLET | Freq: Four times a day (QID) | ORAL | 0 refills | Status: DC | PRN

## 2018-08-11 MED ORDER — PROPOFOL 10 MG/ML IV BOLUS
INTRAVENOUS | Status: AC
  Filled 2018-08-11: qty 20

## 2018-08-11 MED ORDER — CEFAZOLIN SODIUM-DEXTROSE 2-4 GM/100ML-% IV SOLN
INTRAVENOUS | Status: AC
  Filled 2018-08-11: qty 100

## 2018-08-11 MED ORDER — MIDAZOLAM HCL 2 MG/2ML IJ SOLN
INTRAMUSCULAR | Status: AC
  Filled 2018-08-11: qty 2

## 2018-08-11 MED ORDER — FENTANYL CITRATE (PF) 100 MCG/2ML IJ SOLN
100.0000 ug | Freq: Once | INTRAMUSCULAR | Status: AC
  Administered 2018-08-11: 13:00:00 100 ug via INTRAVENOUS
  Filled 2018-08-11: qty 2

## 2018-08-11 MED ORDER — LIDOCAINE 2% (20 MG/ML) 5 ML SYRINGE
INTRAMUSCULAR | Status: DC | PRN
  Administered 2018-08-11: 60 mg via INTRAVENOUS

## 2018-08-11 MED ORDER — BUPIVACAINE HCL (PF) 0.5 % IJ SOLN
INTRAMUSCULAR | Status: DC | PRN
  Administered 2018-08-11: 20 mL via PERINEURAL

## 2018-08-11 MED ORDER — PHENYLEPHRINE 40 MCG/ML (10ML) SYRINGE FOR IV PUSH (FOR BLOOD PRESSURE SUPPORT)
PREFILLED_SYRINGE | INTRAVENOUS | Status: DC | PRN
  Administered 2018-08-11: 80 ug via INTRAVENOUS

## 2018-08-11 MED ORDER — LACTATED RINGERS IV SOLN
INTRAVENOUS | Status: DC
  Administered 2018-08-11: 15:00:00 via INTRAVENOUS
  Administered 2018-08-11: 50 mL/h via INTRAVENOUS
  Filled 2018-08-11: qty 1000

## 2018-08-11 MED ORDER — FENTANYL CITRATE (PF) 100 MCG/2ML IJ SOLN
INTRAMUSCULAR | Status: AC
  Filled 2018-08-11: qty 2

## 2018-08-11 MED ORDER — MIDAZOLAM HCL 2 MG/2ML IJ SOLN
2.0000 mg | Freq: Once | INTRAMUSCULAR | Status: AC
  Administered 2018-08-11: 2 mg via INTRAVENOUS
  Filled 2018-08-11: qty 2

## 2018-08-11 MED ORDER — CHLORHEXIDINE GLUCONATE 4 % EX LIQD
60.0000 mL | Freq: Once | CUTANEOUS | Status: DC
  Filled 2018-08-11: qty 118

## 2018-08-11 MED ORDER — GLYCOPYRROLATE PF 0.2 MG/ML IJ SOSY
PREFILLED_SYRINGE | INTRAMUSCULAR | Status: AC
  Filled 2018-08-11: qty 1

## 2018-08-11 MED ORDER — BUPIVACAINE LIPOSOME 1.3 % IJ SUSP
INTRAMUSCULAR | Status: DC | PRN
  Administered 2018-08-11: 10 mL via PERINEURAL

## 2018-08-11 MED ORDER — ONDANSETRON HCL 4 MG/2ML IJ SOLN
INTRAMUSCULAR | Status: AC
  Filled 2018-08-11: qty 2

## 2018-08-11 MED ORDER — EPINEPHRINE PF 1 MG/ML IJ SOLN
INTRAMUSCULAR | Status: DC | PRN
  Administered 2018-08-11: 2 mg

## 2018-08-11 MED ORDER — PROPOFOL 10 MG/ML IV BOLUS
INTRAVENOUS | Status: DC | PRN
  Administered 2018-08-11: 130 mg via INTRAVENOUS
  Administered 2018-08-11: 30 mg via INTRAVENOUS

## 2018-08-11 MED ORDER — DEXAMETHASONE SODIUM PHOSPHATE 10 MG/ML IJ SOLN
INTRAMUSCULAR | Status: AC
  Filled 2018-08-11: qty 1

## 2018-08-11 MED ORDER — CEFAZOLIN SODIUM-DEXTROSE 2-4 GM/100ML-% IV SOLN
2.0000 g | INTRAVENOUS | Status: AC
  Administered 2018-08-11: 14:00:00 2 g via INTRAVENOUS
  Filled 2018-08-11: qty 100

## 2018-08-11 MED ORDER — SUCCINYLCHOLINE CHLORIDE 200 MG/10ML IV SOSY
PREFILLED_SYRINGE | INTRAVENOUS | Status: DC | PRN
  Administered 2018-08-11: 100 mg via INTRAVENOUS

## 2018-08-11 MED ORDER — GLYCOPYRROLATE 0.2 MG/ML IJ SOLN
INTRAMUSCULAR | Status: DC | PRN
  Administered 2018-08-11: 0.1 mg via INTRAVENOUS

## 2018-08-11 MED ORDER — EPHEDRINE SULFATE-NACL 50-0.9 MG/10ML-% IV SOSY
PREFILLED_SYRINGE | INTRAVENOUS | Status: DC | PRN
  Administered 2018-08-11 (×2): 10 mg via INTRAVENOUS

## 2018-08-11 MED ORDER — PROMETHAZINE HCL 25 MG/ML IJ SOLN
6.2500 mg | INTRAMUSCULAR | Status: DC | PRN
  Filled 2018-08-11: qty 1

## 2018-08-11 MED ORDER — FENTANYL CITRATE (PF) 100 MCG/2ML IJ SOLN
INTRAMUSCULAR | Status: DC | PRN
  Administered 2018-08-11: 100 ug via INTRAVENOUS

## 2018-08-11 MED ORDER — SODIUM CHLORIDE 0.9 % IV SOLN
INTRAVENOUS | Status: DC | PRN
  Administered 2018-08-11: 14:00:00 20 ug/min via INTRAVENOUS

## 2018-08-11 MED ORDER — PHENYLEPHRINE HCL (PRESSORS) 10 MG/ML IV SOLN
INTRAVENOUS | Status: AC
  Filled 2018-08-11: qty 1

## 2018-08-11 MED ORDER — LIDOCAINE 2% (20 MG/ML) 5 ML SYRINGE
INTRAMUSCULAR | Status: AC
  Filled 2018-08-11: qty 5

## 2018-08-11 SURGICAL SUPPLY — 100 items
ANCH SUT SWLK 19.1X4.75 VT (Anchor) ×1 IMPLANT
ANCHOR PEEK 4.75X19.1 SWLK C (Anchor) ×2 IMPLANT
APL SKNCLS STERI-STRIP NONHPOA (GAUZE/BANDAGES/DRESSINGS)
BENZOIN TINCTURE PRP APPL 2/3 (GAUZE/BANDAGES/DRESSINGS) IMPLANT
BLADE 4.2CUDA (BLADE) ×1 IMPLANT
BLADE EXCALIBUR 4.0MM X 13CM (MISCELLANEOUS) ×1
BLADE EXCALIBUR 4.0X13 (MISCELLANEOUS) ×1 IMPLANT
BLADE SURG 10 STRL SS (BLADE) IMPLANT
BLADE SURG 15 STRL LF DISP TIS (BLADE) IMPLANT
BLADE SURG 15 STRL SS (BLADE)
BLADE VORTEX 6.0 (BLADE) ×3 IMPLANT
BUR VERTEX HOODED 4.5 (BURR) IMPLANT
BURR OVAL 8 FLU 4.0MM X 13CM (MISCELLANEOUS) ×1
BURR OVAL 8 FLU 4.0X13 (MISCELLANEOUS) ×1 IMPLANT
CANNULA 5.75X71 LONG (CANNULA) ×2 IMPLANT
CANNULA TWIST IN 8.25X7CM (CANNULA) ×2 IMPLANT
CLOSURE WOUND 1/2 X4 (GAUZE/BANDAGES/DRESSINGS)
COVER WAND RF STERILE (DRAPES) ×3 IMPLANT
CUTTER MENISCUS  4.2MM (BLADE)
CUTTER MENISCUS 4.2MM (BLADE) IMPLANT
DECANTER SPIKE VIAL GLASS SM (MISCELLANEOUS) IMPLANT
DISSECTOR 4.0MM X 13CM (MISCELLANEOUS) ×2 IMPLANT
DRAPE INCISE IOBAN 66X45 STRL (DRAPES) ×3 IMPLANT
DRAPE SHEET LG 3/4 BI-LAMINATE (DRAPES) IMPLANT
DRAPE STERI 35X30 U-POUCH (DRAPES) ×3 IMPLANT
DRAPE SURG 17X23 STRL (DRAPES) ×3 IMPLANT
DRAPE U-SHAPE 47X51 STRL (DRAPES) ×3 IMPLANT
DRAPE U-SHAPE 76X120 STRL (DRAPES) ×6 IMPLANT
DRSG ADAPTIC 3X8 NADH LF (GAUZE/BANDAGES/DRESSINGS) ×3 IMPLANT
DRSG EMULSION OIL 3X3 NADH (GAUZE/BANDAGES/DRESSINGS) IMPLANT
DRSG PAD ABDOMINAL 8X10 ST (GAUZE/BANDAGES/DRESSINGS) ×3 IMPLANT
DURAPREP 26ML APPLICATOR (WOUND CARE) ×3 IMPLANT
ELECT REM PT RETURN 9FT ADLT (ELECTROSURGICAL) ×3
ELECTRODE REM PT RTRN 9FT ADLT (ELECTROSURGICAL) ×1 IMPLANT
FIBERSTICK 2 (SUTURE) IMPLANT
GAUZE SPONGE 4X4 12PLY STRL (GAUZE/BANDAGES/DRESSINGS) ×3 IMPLANT
GLOVE BIO SURGEON STRL SZ7 (GLOVE) ×2 IMPLANT
GLOVE BIOGEL PI IND STRL 7.5 (GLOVE) IMPLANT
GLOVE BIOGEL PI IND STRL 8 (GLOVE) ×2 IMPLANT
GLOVE BIOGEL PI INDICATOR 7.5 (GLOVE) ×6
GLOVE BIOGEL PI INDICATOR 8 (GLOVE) ×4
GLOVE ECLIPSE 7.5 STRL STRAW (GLOVE) ×9 IMPLANT
GOWN STRL REUS W/TWL LRG LVL3 (GOWN DISPOSABLE) ×6 IMPLANT
GOWN STRL REUS W/TWL XL LVL3 (GOWN DISPOSABLE) ×3 IMPLANT
IV NS IRRIG 3000ML ARTHROMATIC (IV SOLUTION) ×14 IMPLANT
MANIFOLD NEPTUNE II (INSTRUMENTS) ×3 IMPLANT
NDL 1/2 CIR CATGUT .05X1.09 (NEEDLE) IMPLANT
NDL HYPO 20X1 EYE RM 11 (NEEDLE) IMPLANT
NDL SAFETY ECLIPSE 18X1.5 (NEEDLE) IMPLANT
NDL SCORPION MULTI FIRE (NEEDLE) IMPLANT
NDL SUT 6 .5 CRC .975X.05 MAYO (NEEDLE) IMPLANT
NEEDLE 1/2 CIR CATGUT .05X1.09 (NEEDLE) IMPLANT
NEEDLE FILTER BLUNT 18X 1/2SAF (NEEDLE)
NEEDLE FILTER BLUNT 18X1 1/2 (NEEDLE) IMPLANT
NEEDLE HYPO 18GX1.5 BLUNT FILL (NEEDLE) IMPLANT
NEEDLE HYPO 18GX1.5 SHARP (NEEDLE)
NEEDLE HYPO 20X1 EYE RM 11 (NEEDLE) IMPLANT
NEEDLE MAYO TAPER (NEEDLE)
NEEDLE SCORPION MULTI FIRE (NEEDLE) ×3 IMPLANT
NS IRRIG 1000ML POUR BTL (IV SOLUTION) IMPLANT
PACK ARTHROSCOPY DSU (CUSTOM PROCEDURE TRAY) ×3 IMPLANT
PACK BASIN DAY SURGERY FS (CUSTOM PROCEDURE TRAY) ×3 IMPLANT
PAD ABD 8X10 STRL (GAUZE/BANDAGES/DRESSINGS) ×4 IMPLANT
PASSER SUT SWANSON 36MM LOOP (INSTRUMENTS) IMPLANT
PENCIL BUTTON HOLSTER BLD 10FT (ELECTRODE) IMPLANT
PROBE BIPOLAR ATHRO 135MM 90D (MISCELLANEOUS) ×3 IMPLANT
SET IRRIG Y TYPE TUR BLADDER L (SET/KITS/TRAYS/PACK) ×3 IMPLANT
SLING ARM FOAM STRAP LRG (SOFTGOODS) ×2 IMPLANT
SLING ARM IMMOBILIZER LRG (SOFTGOODS) IMPLANT
SLING ARM MED ADULT FOAM STRAP (SOFTGOODS) IMPLANT
SPONGE LAP 4X18 RFD (DISPOSABLE) IMPLANT
STRIP CLOSURE SKIN 1/2X4 (GAUZE/BANDAGES/DRESSINGS) IMPLANT
SUCTION FRAZIER HANDLE 10FR (MISCELLANEOUS)
SUCTION TUBE FRAZIER 10FR DISP (MISCELLANEOUS) IMPLANT
SUT ETHIBOND 2 OS 4 DA (SUTURE) IMPLANT
SUT ETHILON 3 0 PS 1 (SUTURE) IMPLANT
SUT ETHILON 4 0 PS 2 18 (SUTURE) IMPLANT
SUT FIBERWIRE #2 38 REV NDL BL (SUTURE)
SUT MNCRL AB 3-0 PS2 18 (SUTURE) IMPLANT
SUT PDS AB 0 CT 36 (SUTURE) IMPLANT
SUT TIGER TAPE 7 IN WHITE (SUTURE) ×2 IMPLANT
SUT VIC AB 0 CT1 27 (SUTURE)
SUT VIC AB 0 CT1 27XBRD ANBCTR (SUTURE) IMPLANT
SUT VIC AB 0 CT2 27 (SUTURE) IMPLANT
SUT VIC AB 1 CT1 27 (SUTURE)
SUT VIC AB 1 CT1 27XBRD ANBCTR (SUTURE) IMPLANT
SUT VIC AB 2-0 SH 27 (SUTURE)
SUT VIC AB 2-0 SH 27XBRD (SUTURE) IMPLANT
SUTURE FIBERWR#2 38 REV NDL BL (SUTURE) IMPLANT
SYR 3ML 18GX1 1/2 (SYRINGE) IMPLANT
SYR 5ML LL (SYRINGE) IMPLANT
TAPE CLOTH SURG 6X10 WHT LF (GAUZE/BANDAGES/DRESSINGS) ×3 IMPLANT
TAPE FIBER 2MM 7IN #2 BLUE (SUTURE) IMPLANT
TAPE LABRALWHITE 1.5X36 (TAPE) IMPLANT
TAPE PAPER 3X10 WHT MICROPORE (GAUZE/BANDAGES/DRESSINGS) IMPLANT
TOWEL OR 17X26 10 PK STRL BLUE (TOWEL DISPOSABLE) ×3 IMPLANT
TUBE CONNECTING 12'X1/4 (SUCTIONS) ×1
TUBE CONNECTING 12X1/4 (SUCTIONS) ×2 IMPLANT
WATER STERILE IRR 1000ML POUR (IV SOLUTION) ×3 IMPLANT
YANKAUER SUCT BULB TIP NO VENT (SUCTIONS) IMPLANT

## 2018-08-11 NOTE — Op Note (Signed)
Lori Citizen: [name], [name] MEDICAL RECORD [id number] ACCOUNT [id number] DATE OF BIRTH:[birth year] FACILITY: [facility] LOCATION: [facility] PHYSICIAN:Rafal Archuleta Starling MannsL. Breyon Blass, MD  OPERATIVE REPORT  DATE OF PROCEDURE:  08/11/2018  PREOPERATIVE DIAGNOSES:   1.  Impingement.   2.  Acromioclavicular joint arthritis  POSTOPERATIVE DIAGNOSES:  1.  Impingement.   2.  Acromioclavicular joint arthritis 3.  Rotator cuff tear of the supraspinatus tendon insertion. 4.  Anterior superior labral tear.  PRINCIPAL PROCEDURES: 1.  Arthroscopic rotator cuff repair with a single FiberTape laterally to a SwiveLock anchor. 2.  Arthroscopic subacromial decompression. 3.  Arthroscopic distal clavicle resection from anterior compartment over 20 mm. 4.  Arthroscopic debridement within the glenohumeral joint of the anterior labral tear.  SURGEON:  Jodi GeraldsJohn Macyn Shropshire, MD  ASSISTANT:  Gus PumaJim Bethune PA-C, who was present for entire case and assisted by manipulation of the arm and closing to minimize OR time.  BRIEF HISTORY:  The patient is a 58 year old female with a long history of significant complaints of left shoulder pain that has been treated conservatively for a prolonged period of time.  After failure of all conservative care, an MRI was obtained,  which showed that she had just inflammation and tendinosis of the rotator cuff.  After failure of conservative care and improvement with injection therapy, she was taken to the operating room.  Preoperative plain x-ray showed a significant spur at the  distal clavicle and the acromion, creating sort of a very significant inferior extension of spurring at this area.  She was brought to the operating room for fixation of this procedure.  DESCRIPTION OF PROCEDURE:  The patient brought to the operating room after adequate anesthesia was obtained with general anesthetic, the patient was brought to the operating table and moved into beach chair position.  All bony prominences well  padded.   Attention was turned to the left shoulder.  After routine prep and drape arthroscopic examination of the shoulder revealed that there were no significant issues with the biceps tendon.  It was freely mobile and had no significant fray.  Attention was  turned to the glenohumeral joint.  There was a little bit of central glenoid wear, which was debrided.  I went to the anterior labrum.  She had an anterior labral tear, which was debrided.  Following this, attention was turned towards the rotator cuff  undersurface.  Unfortunately, there was a super high grade tear of the leading edge of the supraspinatus.  We debrided it and as we were debriding it became clear it was essentially full thickness tear.  At that point, we knew that she was going to need  an arthroscopic cuff repair just based on the size and location of the tear.  At this time, we went out of the glenohumeral joint into the subacromial space, made a fourth portal so that we could see the cuff tear from the lateral side.  Once we saw it,  it was pretty easy to identify.  We cleaned up the edges of the cuff tear and got a good solid cuff in the front and back.  At this point, it looked like a single FiberTape would be sufficient.  We passed a FiberTape from the lateral side and the  anterior and posterior portions of this and then brought these 2 to a lateral PushLock anchor.  Once that was completed,  excellent repair of the cuff had been achieved and at that point, we felt that that was an excellent repair and so the sutures were  pulled and cut.  At this point, attention was turned towards the acromion where an arthroscopic subacromial decompression was performed from an anterior portal and following this, the distal clavicle was identified and resected over 20 mm of the distal  portion.  At this point, the clavicle was burred with electrocautery to help prevent regrowth.  At this time, a shaver was used to do a debridement of the  subacromial space.  Once this was completed, the arm was put through a range of motion.  Excellent  range of motion and stability was achieved at this point.  The portals were closed with bandages in the lateral portal.  The working portal was closed with a couple of interrupted sutures.  Sterile compressive dressing was applied over the entire  shoulder.    The patient was taken to the recovery room in satisfactory condition.  Estimated blood loss for procedure was minimal.  AN/NUANCE  D:08/11/2018 T:08/11/2018 JOB:[id number]

## 2018-08-11 NOTE — Anesthesia Procedure Notes (Signed)
Anesthesia Regional Block: Interscalene brachial plexus block   Pre-Anesthetic Checklist: ,, timeout performed, Correct Patient, Correct Site, Correct Laterality, Correct Procedure, Correct Position, site marked, Risks and benefits discussed,  Surgical consent,  Pre-op evaluation,  At surgeon's request and post-op pain management  Laterality: Left  Prep: chloraprep       Needles:  Injection technique: Single-shot  Needle Type: Stimiplex     Needle Length: 9cm  Needle Gauge: 21     Additional Needles:   Procedures:,,,, ultrasound used (permanent image in chart),,,,  Narrative:  Start time: 08/11/2018 12:54 PM End time: 08/11/2018 12:59 PM Injection made incrementally with aspirations every 5 mL.  Performed by: Personally  Anesthesiologist: Lynda Rainwater, MD

## 2018-08-11 NOTE — H&P (Signed)
A pre op hand p   Chief Complaint: Left shoulder pain  HPI: [name] is a 58 y.o. female who presents for evaluation of left shoulder pain. It has been present for greater than 3 months and has been worsening. She has failed conservative measures. Pain is rated as moderate.  Past Medical History:  Diagnosis Date  . Anxiety   . Chronic lower back pain   . Chronic pain syndrome    followed by pain clinic [facility]  . COPD (chronic obstructive pulmonary disease) ([place])   . DDD (degenerative disc disease), cervical   . DDD (degenerative disc disease), lumbar   . Depression   . Diabetic peripheral neuropathy (H[facility])    hands and feet  . Fibromyalgia   . Full dentures    only wears upper  . History of 2019 novel coronavirus disease (COVID-19)    08-10-2018  per pt in May 2020,  recovered at home without supplemental oxygen, never admitted to hospital,  residual nasal congestion and loss of taste is coming back , no cough sore throat sob or difficultly breathing,  Negative covid test done 08-07-2018  . History of pyelonephritis 09/2017  . Hypothyroidism   . IBS (irritable bowel syndrome)    no current med.  . Left shoulder pain   . Lumbar nerve root impingement   . Lumbar post-laminectomy syndrome   . OSA (obstructive sleep apnea)    08-10-2018  per pt has used in 8 months  . Osteoarthritis   . Type II diabetes mellitus ([place])    followed by pcp  . Wears glasses    Past Surgical History:  Procedure Laterality Date  . BLADDER SUSPENSION  2006  . CARPAL TUNNEL RELEASE Bilateral right 07/22/2001;  left 01-05-2003  dr Percell Miller @[facility]   . COLONOSCOPY W/ POLYPECTOMY    . DILATION AND CURETTAGE OF UTERUS    . LAPAROSCOPIC CHOLECYSTECTOMY  1992  . LUMBAR LAMINECTOMY  1997   L4 --5  . MULTIPLE EXTRACTIONS WITH ALVEOLOPLASTY N/A 11/26/2012   Procedure: MULTIPLE EXTRACTION WITH ALVEOLOPLASTY;  Surgeon: Gae Bon, DDS;  Location: [place];  Service: Oral  Surgery;  Laterality: N/A;  . TONSILLECTOMY  age 48  . TOTAL HIP ARTHROPLASTY Left 07/10/2014   Procedure: TOTAL HIP ARTHROPLASTY ANTERIOR APPROACH;  Surgeon: Dorna Leitz, MD;  Location: [place];  Service: Orthopedics;  Laterality: Left;  . TRIGGER FINGER RELEASE Right 2018   thumb  . TRIGGER FINGER RELEASE Left 10/22/2015   Procedure: LEFT TRIGGER THUMB RELEASE;  Surgeon: Milly Jakob, MD;  Location: [place];  Service: Orthopedics;  Laterality: Left;  . TUBAL LIGATION  yrs ago  . ULNAR NERVE TRANSPOSITION Right 03/27/2014   Procedure: RIGHT ULNAR NEUROPLASTY AT THE ELBOW;  Surgeon: Jolyn Nap, MD;  Location: [place];  Service: Orthopedics;  Laterality: Right;  . ULNAR NERVE TRANSPOSITION Left 12/08/2016   Procedure: LEFT ULNAR NEUROPLASTY AT THE ELBOW;  Surgeon: Milly Jakob, MD;  Location: [place];  Service: Orthopedics;  Laterality: Left;  . UPPER GASTROINTESTINAL ENDOSCOPY    . VAGINAL HYSTERECTOMY  2006   w/  ANTERIOR AND POSTERIOR REPAIR WITH SLING SUSPENSION   Social History   Socioeconomic History  . Marital status: Divorced    Spouse name: Not on file  . Number of children: Not on file  . Years of education: Not on file  . Highest education level: Not on file  Occupational History  . Not on  file  Social Needs  . Financial resource strain: Not on file  . Food insecurity    Worry: Not on file    Inability: Not on file  . Transportation needs    Medical: Not on file    Non-medical: Not on file  Tobacco Use  . Smoking status: Former Smoker    Packs/day: 1.50    Years: 30.00    Pack years: 45.00    Types: Cigarettes    Quit date: 12/01/2007    Years since quitting: 10.7  . Smokeless tobacco: Never Used  Substance and Sexual Activity  . Alcohol use: Not Currently    Frequency: Never  . Drug use: Never  . Sexual activity: Yes    Birth control/protection: Surgical  Lifestyle  . Physical activity    Days per  week: Not on file    Minutes per session: Not on file  . Stress: Not on file  Relationships  . Social Musicianconnections    Talks on phone: Not on file    Gets together: Not on file    Attends religious service: Not on file    Active member of club or organization: Not on file    Attends meetings of clubs or organizations: Not on file    Relationship status: Not on file  Other Topics Concern  . Not on file  Social History Narrative  . Not on file   History reviewed. No pertinent family history. Allergies  Allergen Reactions  . Adhesive [Tape] Hives  . Latex Hives  . Oxycodone Itching   Prior to Admission medications   Medication Sig Start Date End Date Taking? Authorizing Provider  acetaminophen (TYLENOL) 325 MG tablet Take 2 tablets (650 mg total) every 6 (six) hours by mouth. 12/08/16  Yes Mack Hookhompson, David, MD  albuterol (PROVENTIL HFA;VENTOLIN HFA) 108 (90 Base) MCG/ACT inhaler Inhale every 6 (six) hours as needed into the lungs for wheezing or shortness of breath.   Yes [provider]  atorvastatin (LIPITOR) 10 MG tablet Take 10 mg by mouth daily.   Yes [provider]  cyclobenzaprine (FLEXERIL) 10 MG tablet Take 10 mg by mouth daily as needed for muscle spasms.    Yes [provider]  FLUoxetine (PROZAC) 20 MG tablet Take 20 mg by mouth daily.   Yes [provider]  fluticasone furoate-vilanterol (BREO ELLIPTA) 100-25 MCG/INH AEPB Inhale 1 puff into the lungs daily.   Yes [provider]  HYDROcodone-acetaminophen (NORCO) 10-325 MG tablet Take 1 tablet by mouth 2 (two) times daily as needed. 10/01/17  Yes [provider]  levothyroxine (SYNTHROID, LEVOTHROID) 50 MCG tablet Take 50 mcg by mouth daily before breakfast.   Yes [provider]  meloxicam (MOBIC) 15 MG tablet Take 1 tablet (15 mg total) daily by mouth. 12/08/16  Yes Mack Hookhompson, David, MD  metFORMIN (GLUCOPHAGE-XR) 500 MG 24 hr tablet Take 500 mg by mouth 2 (two)  times daily. 09/25/17  Yes [provider]  morphine (MSIR) 15 MG tablet Take 15 mg by mouth 3 (three) times daily.   Yes [provider]  pioglitazone (ACTOS) 15 MG tablet Take 15 mg by mouth daily. 09/01/17  Yes [provider]  pregabalin (LYRICA) 200 MG capsule Take 200 mg by mouth 3 (three) times daily.   Yes [provider]  QUEtiapine (SEROQUEL) 100 MG tablet Take 100 mg at bedtime by mouth.   Yes [provider]  rOPINIRole (REQUIP) 0.25 MG tablet Take 0.25 mg by mouth  at bedtime.  09/21/17  Yes [provider]     Positive ROS: None  All other systems have been reviewed and were otherwise negative with the exception of those mentioned in the HPI and as above.  Physical Exam: Vitals:   08/11/18 1330 08/11/18 1335  BP: 124/64 (!) 117/92  Pulse: 65 65  Resp: 15 (!) 21  Temp:    SpO2: 99% 98%    General: Alert, no acute distress Cardiovascular: No pedal edema Respiratory: No cyanosis, no use of accessory musculature GI: No organomegaly, abdomen is soft and non-tender Skin: No lesions in the area of chief complaint Neurologic: Sensation intact distally Psychiatric: Patient is competent for consent with normal mood and affect Lymphatic: No axillary or cervical lymphadenopathy  MUSCULOSKELETAL: Left shoulder: Painful range of motion.  Limited range of motion.  Good external rotation strength.  Negative drop arm test.  MRI: MRI shows tendinopathy with significant impingement from distal clavicle and acromion.  Assessment/Plan: LEFT SHOULDER PAIN Plan for Procedure(s): SHOULDER ARTHROSCOPY WITH ROTATOR CUFF REPAIR AND SUBACROMIAL DECOMPRESSION and DISTAL CLAVICLE RESECTION  The risks benefits and alternatives were discussed with the patient including but not limited to the risks of nonoperative treatment, versus surgical intervention including infection, bleeding, nerve injury, malunion, nonunion, hardware prominence, hardware  failure, need for hardware removal, blood clots, cardiopulmonary complications, morbidity, mortality, among others, and they were willing to proceed.  Predicted outcome is good, although there will be at least a six to nine month expected recovery.  Harvie JuniorJohn L Haedyn Breau, MD 08/11/2018 1:56 PM

## 2018-08-11 NOTE — Anesthesia Postprocedure Evaluation (Signed)
Anesthesia Post Note  Patient: [name]  Procedure(s) Performed: SHOULDER ARTHROSCOPY WITH ROTATOR CUFF REPAIR AND SUBACROMIAL DECOMPRESSION and DISTAL CLAVICLE ,DEBRIDEMENT ANTERIOR LABRIL TEAR (Left Shoulder)     Patient location during evaluation: PACU Anesthesia Type: General Level of consciousness: awake and alert Pain management: pain level controlled Vital Signs Assessment: post-procedure vital signs reviewed and stable Respiratory status: spontaneous breathing, nonlabored ventilation, respiratory function stable and patient connected to nasal cannula oxygen Cardiovascular status: blood pressure returned to baseline and stable Postop Assessment: no apparent nausea or vomiting Anesthetic complications: no    Last Vitals:  Vitals:   08/11/18 1700 08/11/18 1715  BP: 108/68 104/63  Pulse: 79 75  Resp: 20 16  Temp:  36.6 C  SpO2: 94% 92%    Last Pain:  Vitals:   08/11/18 1715  TempSrc:   PainSc: 0-No pain                 Iman Orourke S

## 2018-08-11 NOTE — Progress Notes (Signed)
Assisted Dr. Miller with left, ultrasound guided, interscalene  block. Side rails up, monitors on throughout procedure. See vital signs in flow sheet. Tolerated Procedure well.  

## 2018-08-11 NOTE — Anesthesia Preprocedure Evaluation (Signed)
Anesthesia Evaluation  Patient identified by MRN, date of birth, ID band Patient awake    Reviewed: Allergy & Precautions, H&P , NPO status , Patient's Chart, lab work & pertinent test results  Airway Mallampati: II  TM Distance: >3 FB Neck ROM: Full    Dental no notable dental hx. (+) Upper Dentures, Edentulous Lower, Dental Advisory Given   Pulmonary asthma , sleep apnea and Continuous Positive Airway Pressure Ventilation , COPD,  COPD inhaler, former smoker,    Pulmonary exam normal breath sounds clear to auscultation       Cardiovascular hypertension, Pt. on medications  Rhythm:Regular Rate:Normal     Neuro/Psych Anxiety Depression negative neurological ROS     GI/Hepatic negative GI ROS, Neg liver ROS,   Endo/Other  diabetes, Insulin Dependent, Oral Hypoglycemic AgentsHypothyroidism Morbid obesity  Renal/GU negative Renal ROS  negative genitourinary   Musculoskeletal  (+) Arthritis , Fibromyalgia -  Abdominal (+) + obese,   Peds  Hematology negative hematology ROS (+)   Anesthesia Other Findings   Reproductive/Obstetrics negative OB ROS                             Anesthesia Physical  Anesthesia Plan  ASA: III  Anesthesia Plan: General   Post-op Pain Management:  Regional for Post-op pain   Induction: Intravenous  PONV Risk Score and Plan: 3 and Ondansetron, Dexamethasone and Midazolam  Airway Management Planned: LMA  Additional Equipment:   Intra-op Plan:   Post-operative Plan: Extubation in OR  Informed Consent: I have reviewed the patients History and Physical, chart, labs and discussed the procedure including the risks, benefits and alternatives for the proposed anesthesia with the patient or authorized representative who has indicated his/her understanding and acceptance.     Dental advisory given  Plan Discussed with: CRNA  Anesthesia Plan Comments:          Anesthesia Quick Evaluation

## 2018-08-11 NOTE — Discharge Instructions (Signed)
Discharge Instructions after Arthroscopic Shoulder Repair   A sling has been provided for you. Remain in your sling at all times. This includes sleeping in your sling.  Use ice on the shoulder intermittently over the first 48 hours after surgery.  Pain medicine has been prescribed for you.  Use your medicine liberally over the first 48 hours, and then you can begin to taper your use. You may take Extra Strength Tylenol or Tylenol only in place of the pain pills. DO NOT take ANY nonsteroidal anti-inflammatory pain medications: Advil, Motrin, Ibuprofen, Aleve, Naproxen, or Narprosyn.  You may remove your dressing after two days. If the incision sites are still moist, place a Band-Aid over the moist site(s). Change Band-Aids daily until dry.  You may shower 5 days after surgery. The incisions CANNOT get wet prior to 5 days. Simply allow the water to wash over the site and then pat dry. Do not rub the incisions. Make sure your axilla (armpit) is completely dry after showering.  Take one aspirin a day for 2 weeks after surgery, unless you have an aspirin sensitivity/ allergy or asthma.   Please call [phone] during normal business hours or [phone] after hours for any problems. Including the following:  - excessive redness of the incisions - drainage for more than 4 days - fever of more than 101.5 F  *Please note that pain medications will not be refilled after hours or on weekends.   Post Anesthesia Home Care Instructions  Activity: Get plenty of rest for the remainder of the day. A responsible individual must stay with you for 24 hours following the procedure.  For the next 24 hours, DO NOT: -Drive a car -Paediatric nurse -Drink alcoholic beverages -Take any medication unless instructed by your physician -Make any legal decisions or sign important papers.  Meals: Start with liquid foods such as gelatin or soup. Progress to regular foods as tolerated. Avoid greasy, spicy, heavy  foods. If nausea and/or vomiting occur, drink only clear liquids until the nausea and/or vomiting subsides. Call your physician if vomiting continues.  Special Instructions/Symptoms: Your throat may feel dry or sore from the anesthesia or the breathing tube placed in your throat during surgery. If this causes discomfort, gargle with warm salt water. The discomfort should disappear within 24 hours.  If you had a scopolamine patch placed behind your ear for the management of post- operative nausea and/or vomiting:  1. The medication in the patch is effective for 72 hours, after which it should be removed.  Wrap patch in a tissue and discard in the trash. Wash hands thoroughly with soap and water. 2. You may remove the patch earlier than 72 hours if you experience unpleasant side effects which may include dry mouth, dizziness or visual disturbances. 3. Avoid touching the patch. Wash your hands with soap and water after contact with the patch.    Information for Discharge Teaching: EXPAREL (bupivacaine liposome injectable suspension)   Your surgeon or anesthesiologist gave you EXPAREL(bupivacaine) to help control your pain after surgery.   EXPAREL is a local anesthetic that provides pain relief by numbing the tissue around the surgical site.  EXPAREL is designed to release pain medication over time and can control pain for up to 72 hours.  Depending on how you respond to EXPAREL, you may require less pain medication during your recovery.  Possible side effects:  Temporary loss of sensation or ability to move in the area where bupivacaine was injected.  Nausea, vomiting, constipation  Rarely, numbness and tingling in your mouth or lips, lightheadedness, or anxiety may occur.  Call your doctor right away if you think you may be experiencing any of these sensations, or if you have other questions regarding possible side effects.  Follow all other discharge instructions given to you by your  surgeon or nurse. Eat a healthy diet and drink plenty of water or other fluids.  If you return to the hospital for any reason within 96 hours following the administration of EXPAREL, it is important for health care providers to know that you have received this anesthetic. A teal colored band has been placed on your arm with the date, time and amount of EXPAREL you have received in order to alert and inform your health care providers. Please leave this armband in place for the full 96 hours following administration, and then you may remove the band. Interscalene Nerve Block, Care After This sheet gives you information about how to care for yourself after your procedure. Your health care provider may also give you more specific instructions. If you have problems or questions, contact your health care provider. What can I expect after the procedure? After the procedure, it is common to have:  Soreness or tenderness in your neck.  Numbness in your shoulder, upper arm, and some fingers.  Weakness in your shoulder and arm muscles. The feeling and strength in your shoulder, arm, and fingers should return to normal within hours after your procedure. Follow these instructions at home: For at least 24 hours after the procedure:  Do not: ? Participate in activities in which you could fall or become injured. ? Drive. ? Use heavy machinery. ? Drink alcohol. ? Take sleeping pills or medicines that cause drowsiness. ? Make important decisions or sign legal documents. ? Take care of children on your own.  Rest. Eating and drinking  If you vomit, drink water, juice, or soup when you can drink without vomiting.  Make sure you have little or no nausea before eating solid foods.  Follow the diet that is recommended by your health care provider. If you have a sling:  Wear it as told by your health care provider. Remove it only as told by your health care provider.  Loosen the sling if your fingers  tingle, become numb, or turn cold and blue.  Make sure that your entire arm, including your wrist, is supported. Do not allow your wrist to dangle over the end of the sling.  Do not let your sling get wet if it is not waterproof.  Keep the sling clean. Bathing  Do not take baths, swim, or use a hot tub until your health care provider approves.  If you have a nerve block catheter in place, keep the incision site and tubing dry. Injection site care   Wash your hands with soap and water before you change your bandage (dressing). If soap and water are not available, use hand sanitizer.  Change your dressing as told by your health care provider.  Keep your dressing dry.  Check your nerve block injection site every day for signs of infection. Check for: ? Redness, swelling, or pain. ? Fluid or blood. ? Warmth. Activity  Do not perform complex or risky activities while taking prescription pain medicine and until you have fully recovered.  Return to your normal activities as told by your health care provider and as you can tolerate them. Ask your health care provider what activities are safe for you.  Rest  and take it easy. This will help you heal and recover more quickly and fully.  Be very cautious until you have regained strength and sensation. General instructions  Have a responsible adult stay with you until you are awake and alert.  Do not drive or use heavy machinery while taking prescription pain medicine and until you have fully recovered. Ask your health care provider when it is safe to drive.  Take over-the-counter and prescription medicines only as told by your health care provider.  If you smoke, do not smoke without supervision.  Do not expose your arm or shoulder to very cold or very hot temperatures until you have full feeling back.  If you have a nerve block catheter in place: ? Try to keep the catheter from getting kinked or pinched. ? Avoid pulling or  tugging on the catheter.  Keep all follow-up visits as told by your health care provider. This is important. Contact a health care provider if:  You have chills or fever.  You have redness, swelling, or pain around your injection site.  You have fluid or blood coming from the injection site.  The skin around the injection site is warm to the touch.  There is a bad smell coming from your dressing.  You have hoarseness or a drooping or dry eye that lasts more than a few days.  You have pain that is poorly controlled with the block or with pain medicine.  You have numbness, tingling, or weakness in your shoulder or arm that lasts for more than one week. Get help right away if:  You have severe pain.  You lose or do not regain strength and sensation in your arm even after the nerve block medicine has stopped.  You have trouble breathing.  You have a nerve block catheter still in place and you begin to shiver.  You have a nerve block catheter still in place and you are getting more and more numb or weak. This information is not intended to replace advice given to you by your health care provider. Make sure you discuss any questions you have with your health care provider. Document Released: 01/05/2015 Document Revised: 01/16/2017 Document Reviewed: 09/14/2015 Elsevier Patient Education  2020 [employer]

## 2018-08-11 NOTE — Brief Op Note (Signed)
08/11/2018  4:09 PM  PATIENT:  [name]  58 y.o. female  PRE-OPERATIVE DIAGNOSIS:  LEFT SHOULDER PAIN  POST-OPERATIVE DIAGNOSIS:  LEFT SHOULDER PAIN  PROCEDURE:  Procedure(s): SHOULDER ARTHROSCOPY WITH ROTATOR CUFF REPAIR AND SUBACROMIAL DECOMPRESSION and DISTAL CLAVICLE ,DEBRIDEMENT ANTERIOR LABRIL TEAR (Left)  SURGEON:  Surgeon(s) and Role:    Dorna Leitz, MD - Primary  PHYSICIAN ASSISTANT:   ASSISTANTS: jim bethune   ANESTHESIA:   general  EBL:  minimal   BLOOD ADMINISTERED:none  DRAINS: none   LOCAL MEDICATIONS USED:  MARCAINE     SPECIMEN:  No Specimen  DISPOSITION OF SPECIMEN:  N/A  COUNTS:  YES  TOURNIQUET:  * No tourniquets in log *  DICTATION: .Other Dictation: Dictation Number [id number]  PLAN OF CARE: Discharge to home after PACU  PATIENT DISPOSITION:  PACU - hemodynamically stable.   Delay start of Pharmacological VTE agent (>24hrs) due to surgical blood loss or risk of bleeding: no

## 2018-08-11 NOTE — Anesthesia Procedure Notes (Signed)
Procedure Name: Intubation Date/Time: 08/11/2018 2:17 PM Performed by: Gwyndolyn Saxon, CRNA Pre-anesthesia Checklist: Patient identified, Emergency Drugs available, Suction available and Patient being monitored Patient Re-evaluated:Patient Re-evaluated prior to induction Oxygen Delivery Method: Circle system utilized Preoxygenation: Pre-oxygenation with 100% oxygen Induction Type: IV induction Ventilation: Mask ventilation without difficulty Laryngoscope Size: Miller and 2 Grade View: Grade I Tube type: Oral Tube size: 7.0 mm Number of attempts: 1 Airway Equipment and Method: Patient positioned with wedge pillow and Stylet Placement Confirmation: ETT inserted through vocal cords under direct vision,  positive ETCO2 and breath sounds checked- equal and bilateral Secured at: 20 cm Tube secured with: Tape Dental Injury: Teeth and Oropharynx as per pre-operative assessment

## 2018-08-11 NOTE — Transfer of Care (Signed)
   Last Vitals:  Vitals Value Taken Time  BP 112/53 08/11/18 1618  Temp    Pulse 82 08/11/18 1621  Resp 19 08/11/18 1621  SpO2 92 % 08/11/18 1621  Vitals shown include unvalidated device data.  Last Pain:  Vitals:   08/11/18 1330  TempSrc:   PainSc: 0-No pain      Patients Stated Pain Goal: 6 (08/11/18 1218)  Immediate Anesthesia Transfer of Care Note  Patient: [name]  Procedure(s) Performed: Procedure(s) (LRB): SHOULDER ARTHROSCOPY WITH ROTATOR CUFF REPAIR AND SUBACROMIAL DECOMPRESSION and DISTAL CLAVICLE ,DEBRIDEMENT ANTERIOR LABRIL TEAR (Left)  Patient Location: PACU  Anesthesia Type: General  Level of Consciousness: awake, alert  and oriented  Airway & Oxygen Therapy: Patient Spontanous Breathing and Patient connected to nasal cannula oxygen  Post-op Assessment: Report given to PACU RN and Post -op Vital signs reviewed and stable  Post vital signs: Reviewed and stable  Complications: No apparent anesthesia complications

## 2018-08-12 ENCOUNTER — Encounter (HOSPITAL_BASED_OUTPATIENT_CLINIC_OR_DEPARTMENT_OTHER): Payer: Self-pay | Admitting: Orthopedic Surgery

## 2019-05-02 DIAGNOSIS — Z01810 Encounter for preprocedural cardiovascular examination: Secondary | ICD-10-CM

## 2019-05-02 DIAGNOSIS — Z79899 Other long term (current) drug therapy: Secondary | ICD-10-CM | POA: Insufficient documentation

## 2019-05-02 HISTORY — DX: Encounter for preprocedural cardiovascular examination: Z01.810

## 2019-08-03 ENCOUNTER — Emergency Department (HOSPITAL_COMMUNITY)
Admission: EM | Admit: 2019-08-03 | Discharge: 2019-08-03 | Disposition: A | Discharge status: Home / Self Care | Payer: Medicaid Other | Attending: Emergency Medicine | Admitting: Emergency Medicine

## 2019-08-03 ENCOUNTER — Encounter (HOSPITAL_COMMUNITY): Payer: Self-pay

## 2019-08-03 ENCOUNTER — Emergency Department (HOSPITAL_COMMUNITY): Payer: Medicaid Other

## 2019-08-03 DIAGNOSIS — R2230 Localized swelling, mass and lump, unspecified upper limb: Secondary | ICD-10-CM | POA: Diagnosis present

## 2019-08-03 DIAGNOSIS — Z5321 Procedure and treatment not carried out due to patient leaving prior to being seen by health care provider: Secondary | ICD-10-CM | POA: Insufficient documentation

## 2019-08-03 LAB — CBC WITH DIFFERENTIAL/PLATELET
Abs Immature Granulocytes: 0.04 10*3/uL (ref 0.00–0.07)
Basophils Absolute: 0 10*3/uL (ref 0.0–0.1)
Basophils Relative: 0 %
Eosinophils Absolute: 0.3 10*3/uL (ref 0.0–0.5)
Eosinophils Relative: 4 %
HCT: 39.4 % (ref 36.0–46.0)
Hemoglobin: 12.1 g/dL (ref 12.0–15.0)
Immature Granulocytes: 1 %
Lymphocytes Relative: 21 %
Lymphs Abs: 1.7 10*3/uL (ref 0.7–4.0)
MCH: 27.9 pg (ref 26.0–34.0)
MCHC: 30.7 g/dL (ref 30.0–36.0)
MCV: 90.8 fL (ref 80.0–100.0)
Monocytes Absolute: 0.7 10*3/uL (ref 0.1–1.0)
Monocytes Relative: 9 %
Neutro Abs: 5.2 10*3/uL (ref 1.7–7.7)
Neutrophils Relative %: 65 %
Platelets: 200 10*3/uL (ref 150–400)
RBC: 4.34 MIL/uL (ref 3.87–5.11)
RDW: 13.7 % (ref 11.5–15.5)
WBC: 7.9 10*3/uL (ref 4.0–10.5)
nRBC: 0 % (ref 0.0–0.2)

## 2019-08-03 LAB — COMPREHENSIVE METABOLIC PANEL
ALT: 30 U/L (ref 0–44)
AST: 20 U/L (ref 15–41)
Albumin: 3.5 g/dL (ref 3.5–5.0)
Alkaline Phosphatase: 66 U/L (ref 38–126)
Anion gap: 8 (ref 5–15)
BUN: 14 mg/dL (ref 6–20)
CO2: 32 mmol/L (ref 22–32)
Calcium: 9.5 mg/dL (ref 8.9–10.3)
Chloride: 102 mmol/L (ref 98–111)
Creatinine, Ser: 0.9 mg/dL (ref 0.44–1.00)
GFR calc Af Amer: >60 mL/min (ref >60)
GFR calc non Af Amer: >60 mL/min (ref >60)
Glucose, Bld: 269 mg/dL — ABNORMAL HIGH (ref 70–99)
Potassium: 4.8 mmol/L (ref 3.5–5.1)
Sodium: 142 mmol/L (ref 135–145)
Total Bilirubin: 0.8 mg/dL (ref 0.3–1.2)
Total Protein: 6.9 g/dL (ref 6.5–8.1)

## 2019-08-03 LAB — I-STAT BETA HCG BLOOD, ED (MC, WL, AP ONLY): I-stat hCG, quantitative: <5 m[IU]/mL (ref <5)

## 2019-08-03 LAB — LACTIC ACID, PLASMA: Lactic Acid, Venous: 1.4 mmol/L (ref 0.5–1.9)

## 2019-08-03 NOTE — ED Notes (Signed)
Pt did not respond when called to hand out a urine specimen packet

## 2019-08-03 NOTE — ED Notes (Signed)
Pt called for vitals x3. No answer 

## 2019-08-03 NOTE — ED Triage Notes (Signed)
Pt arrives POV as referred by ortho office. Pt states she cut her finger on a catfish while cutting it on Monday, and has been on keflex since that time. Now w/ severe swelling, erythema, purulent drainage. Here for abx

## 2019-08-04 DIAGNOSIS — E039 Hypothyroidism, unspecified: Secondary | ICD-10-CM | POA: Insufficient documentation

## 2019-08-04 DIAGNOSIS — E1169 Type 2 diabetes mellitus with other specified complication: Secondary | ICD-10-CM

## 2019-08-04 DIAGNOSIS — M659 Synovitis and tenosynovitis, unspecified: Secondary | ICD-10-CM | POA: Insufficient documentation

## 2019-08-04 DIAGNOSIS — I10 Essential (primary) hypertension: Secondary | ICD-10-CM

## 2019-08-04 DIAGNOSIS — M65949 Unspecified synovitis and tenosynovitis, unspecified hand: Secondary | ICD-10-CM

## 2019-08-04 DIAGNOSIS — E669 Obesity, unspecified: Secondary | ICD-10-CM | POA: Insufficient documentation

## 2019-08-04 DIAGNOSIS — M5136 Other intervertebral disc degeneration, lumbar region: Secondary | ICD-10-CM | POA: Insufficient documentation

## 2019-08-04 HISTORY — DX: Hypothyroidism, unspecified: E03.9

## 2019-08-04 HISTORY — DX: Obesity, unspecified: E11.69

## 2019-08-04 HISTORY — DX: Essential (primary) hypertension: I10

## 2019-08-04 HISTORY — DX: Unspecified synovitis and tenosynovitis, unspecified hand: M65.949

## 2020-04-13 DIAGNOSIS — G4709 Other insomnia: Secondary | ICD-10-CM

## 2020-04-13 DIAGNOSIS — H9313 Tinnitus, bilateral: Secondary | ICD-10-CM

## 2020-04-13 DIAGNOSIS — R42 Dizziness and giddiness: Secondary | ICD-10-CM | POA: Insufficient documentation

## 2020-04-13 HISTORY — DX: Tinnitus, bilateral: H93.13

## 2020-04-13 HISTORY — DX: Other insomnia: G47.09

## 2020-04-13 HISTORY — DX: Dizziness and giddiness: R42

## 2021-02-08 ENCOUNTER — Encounter: Payer: Self-pay | Admitting: Sports Medicine

## 2021-02-08 ENCOUNTER — Other Ambulatory Visit: Payer: Self-pay

## 2021-02-08 ENCOUNTER — Other Ambulatory Visit: Payer: Self-pay | Admitting: *Deleted

## 2021-02-08 ENCOUNTER — Ambulatory Visit: Payer: Medicaid Other | Admitting: Sports Medicine

## 2021-02-08 DIAGNOSIS — E1142 Type 2 diabetes mellitus with diabetic polyneuropathy: Secondary | ICD-10-CM

## 2021-02-08 DIAGNOSIS — M2042 Other hammer toe(s) (acquired), left foot: Secondary | ICD-10-CM | POA: Diagnosis not present

## 2021-02-08 DIAGNOSIS — L600 Ingrowing nail: Secondary | ICD-10-CM | POA: Diagnosis not present

## 2021-02-08 DIAGNOSIS — E1169 Type 2 diabetes mellitus with other specified complication: Secondary | ICD-10-CM

## 2021-02-08 NOTE — Progress Notes (Signed)
error 

## 2021-02-08 NOTE — Progress Notes (Addendum)
Subjective: [name] is a 61 y.o. female patient who presents to office for evaluation of left greater than right foot pain.  Patient reports sharp shooting tingling and numbness but it is worse at the left fifth toe and feels differently than her neuropathy pain states that it burns.  Hurts with shoes at the fifth toe on the left foot states that when she was younger she used to get a corn to the area and would put a corn pad.  Patient also reports that she has issues with her ingrown toenails states that she goes for pedicures and they trim it out every month but she wants to discuss something more permanent to be done.  Patient is diabetic and admits that last A1c was 7.  Patient reports that she is lost over 50 pounds and this will help get her diabetes under control.  Patient denies any other pedal complaints at this time.  Patient Active Problem List   Diagnosis Date Noted   Dizziness 04/13/2020   Other insomnia 04/13/2020   Tinnitus of both ears 04/13/2020   Acquired hypothyroidism 08/04/2019   DDD (degenerative disc disease), lumbar 08/04/2019   Diabetes mellitus type 2 in obese ([place]) 08/04/2019   Essential hypertension 08/04/2019   Flexor tenosynovitis of finger 08/04/2019   Medication management 05/02/2019   Complete tear of left rotator cuff 08/11/2018   Arthritis of left acromioclavicular joint 08/11/2018   Glenoid labral tear, left, initial encounter 08/11/2018   Impingement syndrome of left shoulder 08/11/2018   Pyelonephritis 10/07/2017   Bilateral foot pain 04/15/2017   Diabetic polyneuropathy associated with type 2 diabetes mellitus ([place]) 05/06/2016   Hammer toe of left foot 05/06/2016   Metatarsalgia, right foot 05/06/2016   RLS (restless legs syndrome) 04/02/2016   Right arm pain 04/12/2015   Primary osteoarthritis of left hip 07/10/2014   Arthritis 04/28/2013   Depression 04/28/2013   Diabetes ([place]) 04/28/2013   Fibromyalgia 04/28/2013    Postlaminectomy syndrome, lumbar region 04/28/2013   Obstructive sleep apnea 04/02/2011    Current Outpatient Medications on File Prior to Visit  Medication Sig Dispense Refill   atorvastatin (LIPITOR) 20 MG tablet Take by mouth.     azelastine (ASTELIN) 0.1 % nasal spray Place into the nose.     cyclobenzaprine (FLEXERIL) 10 MG tablet Take 1 tablet by mouth 3 (three) times daily as needed.     dicyclomine (BENTYL) 10 MG capsule Take by mouth.     FLUoxetine (PROZAC) 40 MG capsule Take 1 capsule by mouth daily.     fluticasone (FLONASE) 50 MCG/ACT nasal spray Place into the nose.     HYDROcodone-acetaminophen (NORCO) 10-325 MG tablet TAKE 1 TABLET BY MOUTH 2 (TWO) TIMES DAILY AS NEEDED FOR UP TO 30 DAYS FOR PAIN. (6/8)     hydrOXYzine (ATARAX) 25 MG tablet Take by mouth.     levothyroxine (SYNTHROID) 50 MCG tablet Take by mouth.     meloxicam (MOBIC) 15 MG tablet Take by mouth.     metFORMIN (GLUCOPHAGE-XR) 500 MG 24 hr tablet Take 1 tablet by mouth 2 (two) times daily.     omeprazole (PRILOSEC) 20 MG capsule Take by mouth.     ACCU-CHEK GUIDE test strip CHECK GLUCOSE TWO TIMES DAILY AT ALTERNATING TIMES. E11.65     Accu-Chek Softclix Lancets lancets SMARTSIG:Topical 2-3 Times Daily     acetaminophen (TYLENOL) 325 MG tablet Take 2 tablets (650 mg total) every 6 (six) hours by mouth.     albuterol (PROVENTIL  HFA;VENTOLIN HFA) 108 (90 Base) MCG/ACT inhaler Inhale every 6 (six) hours as needed into the lungs for wheezing or shortness of breath.     atorvastatin (LIPITOR) 10 MG tablet Take 10 mg by mouth daily.     Blood Glucose Monitoring Suppl (ACCU-CHEK GUIDE ME) w/Device KIT E11.9USE AS DIRECTED     cetirizine (ZYRTEC) 10 MG tablet Take 10 mg by mouth daily.     cyclobenzaprine (FLEXERIL) 10 MG tablet Take 1 tablet (10 mg total) by mouth 3 (three) times daily as needed for muscle spasms. 30 tablet 0   FLUoxetine (PROZAC) 20 MG tablet Take 20 mg by mouth daily.     fluticasone  furoate-vilanterol (BREO ELLIPTA) 100-25 MCG/INH AEPB Inhale 1 puff into the lungs daily.     HYDROmorphone (DILAUDID) 2 MG tablet Take 1-2 tablets (2-4 mg total) by mouth every 6 (six) hours as needed (Severe breakthrough pain.). 30 tablet 0   levothyroxine (SYNTHROID, LEVOTHROID) 50 MCG tablet Take 50 mcg by mouth daily before breakfast.     loratadine (CLARITIN) 10 MG tablet Take 10 mg by mouth daily.     metFORMIN (GLUCOPHAGE-XR) 500 MG 24 hr tablet Take 500 mg by mouth 2 (two) times daily.  2   morphine (MSIR) 15 MG tablet Take 15 mg by mouth 3 (three) times daily.     pioglitazone (ACTOS) 15 MG tablet Take 15 mg by mouth daily.  0   pregabalin (LYRICA) 200 MG capsule Take 200 mg by mouth 3 (three) times daily.     QUEtiapine (SEROQUEL) 100 MG tablet Take 100 mg at bedtime by mouth.     rOPINIRole (REQUIP) 0.25 MG tablet Take 0.25 mg by mouth at bedtime.   5   triamcinolone ointment (KENALOG) 0.1 % APPLY TO AFFECTED AREA 3 TIMES A DAY     zolpidem (AMBIEN) 10 MG tablet Take 10 mg by mouth at bedtime as needed.     No current facility-administered medications on file prior to visit.    Allergies  Allergen Reactions   Adhesive [Tape] Hives   Latex Hives   Oxycodone Itching    Objective:  General: Alert and oriented x3 in no acute distress  Dermatology: Very minimal hyperkeratotic lesion noted at the fifth proximal interphalangeal joint dorsally of the left fifth toe, no open lesions bilateral lower extremities, no webspace macerations, no ecchymosis bilateral, all nails x 10 are well manicured however there is mild incurvation noted at the left hallux medial margin with no surrounding acute signs of infection.  Vascular: Dorsalis Pedis and Posterior Tibial pedal pulses 2/4, Capillary Fill Time 3 seconds,(+) pedal hair growth bilateral, no edema bilateral lower extremities, Temperature gradient within normal limits.  Neurology: Gross sensation intact via light touch bilateral,  subjective numbness to all toes with more shooting pain at the left fifth toe.  Musculoskeletal: Semi-flexible left fifth hammertoe with mild tenderness palpation to the interphalangeal joint.        Assessment and Plan: Problem List Items Addressed This Visit       Endocrine   Diabetes mellitus type 2 in obese (HCC)   Relevant Medications   atorvastatin (LIPITOR) 20 MG tablet   metFORMIN (GLUCOPHAGE-XR) 500 MG 24 hr tablet   Diabetic polyneuropathy associated with type 2 diabetes mellitus (HCC)   Relevant Medications   atorvastatin (LIPITOR) 20 MG tablet   cyclobenzaprine (FLEXERIL) 10 MG tablet   FLUoxetine (PROZAC) 40 MG capsule   hydrOXYzine (ATARAX) 25 MG tablet   metFORMIN (GLUCOPHAGE-XR) 500  MG 24 hr tablet   zolpidem (AMBIEN) 10 MG tablet   Other Visit Diagnoses     Hammertoe of left foot    -  Primary   Ingrown nail            -Complete examination performed -Xrays reviewed on CD from November 2022 is nonweightbearing but suggests hammertoe deformity -Discussed treatment options for hammertoes conservative versus surgical -Dispensed toe cap for the left fifth toe for patient to use as directed to protect the toe from rubbing when in shoes -Encourage patient to continue with good Glycemic control and discussed with her that she may benefit in the future from surgery -At no additional charge mechanically debrided bilateral hallux nails removing any offending nail margins in a slant back fashion and advised patient if there is any residual pain may use antibiotic cream and soak with Epson salt carefully -Patient to return to office as scheduled for surgery consult for left fifth hammertoe and ingrown or sooner if condition worsens.  Landis Martins, DPM

## 2021-03-19 ENCOUNTER — Ambulatory Visit: Payer: Medicaid Other | Admitting: Sports Medicine

## 2021-04-08 ENCOUNTER — Other Ambulatory Visit: Payer: Self-pay

## 2021-04-08 ENCOUNTER — Encounter: Payer: Self-pay | Admitting: Podiatry

## 2021-04-08 ENCOUNTER — Ambulatory Visit: Payer: Medicaid Other | Admitting: Podiatry

## 2021-04-08 DIAGNOSIS — M2042 Other hammer toe(s) (acquired), left foot: Secondary | ICD-10-CM

## 2021-04-08 DIAGNOSIS — M205X2 Other deformities of toe(s) (acquired), left foot: Secondary | ICD-10-CM | POA: Diagnosis not present

## 2021-04-08 DIAGNOSIS — B351 Tinea unguium: Secondary | ICD-10-CM | POA: Diagnosis not present

## 2021-04-08 DIAGNOSIS — E1142 Type 2 diabetes mellitus with diabetic polyneuropathy: Secondary | ICD-10-CM

## 2021-04-08 MED ORDER — CICLOPIROX 8 % EX SOLN: Freq: Every day | CUTANEOUS | 0 refills | Status: DC

## 2021-04-08 NOTE — Progress Notes (Signed)
?Subjective:  ?Patient ID: [name], female    DOB: 1960/03/15,   MRN: [id number] ? ?Chief Complaint  ?Patient presents with  ? Foot Pain  ?  The 5th toe on the left is still bad and irritates if I work out and burns and some pain  ? Nail Problem  ?  I do have some ingrowns on both big toes and I have tried to cut them  ? Callouses  ?  I think I have a spot on the right big toe  ? ? ?61 y.o. female presents for concern of left fifth toe pain and nail problems. Patient was seen by Dr. Marylene LandStover and sent here to discuss surgical options for the fifth toe. Also relates ingrowns and discoloration of the great toenails. Has not tried any fungal treatments.  . Denies any other pedal complaints. Denies n/v/f/c.  ? ?Past Medical History:  ?Diagnosis Date  ? Anxiety   ? Chronic lower back pain   ? Chronic pain syndrome   ? followed by pain clinic [facility] in [place]  ? COPD (chronic obstructive pulmonary disease) (HCC)   ? DDD (degenerative disc disease), cervical   ? DDD (degenerative disc disease), lumbar   ? Depression   ? Diabetic peripheral neuropathy (HCC)   ? hands and feet  ? Fibromyalgia   ? Full dentures   ? only wears upper  ? History of 2019 novel coronavirus disease (COVID-19)   ? 08-10-2018  per pt in May 2020,  recovered at home without supplemental oxygen, never admitted to hospital,  residual nasal congestion and loss of taste is coming back , no cough sore throat sob or difficultly breathing,  Negative covid test done 08-07-2018  ? History of pyelonephritis 09/2017  ? Hypothyroidism   ? IBS (irritable bowel syndrome)   ? no current med.  ? Left shoulder pain   ? Lumbar nerve root impingement   ? Lumbar post-laminectomy syndrome   ? OSA (obstructive sleep apnea)   ? 08-10-2018  per pt has used in 8 months  ? Osteoarthritis   ? Type II diabetes mellitus (HCC)   ? followed by pcp  ? Wears glasses   ? ? ?Objective:  ?Physical Exam: ?Vascular: DP/PT pulses 2/4 bilateral. CFT <3 seconds.  Normal hair growth on digits. No edema.  ?Skin. No lacerations or abrasions bilateral feet. Hallux nails are discolored and thickened.  ?Musculoskeletal: MMT 5/5 bilateral lower extremities in DF, PF, Inversion and Eversion. Deceased ROM in DF of ankle joint.  Adductovarus of left fifth digit with tenderness over the lateral PIPJ.  ?Neurological: Sensation intact to light touch.  ? ?Assessment:  ? ?1. Hammertoe of left foot   ?2. Adductovarus rotation of toe, acquired, left   ?3. Diabetic polyneuropathy associated with type 2 diabetes mellitus (HCC)   ? ? ? ?Plan:  ?Patient was evaluated and treated and all questions answered. ?-X-rays reviewed. ?-Discussed dystrophic nails and fungal treatments.  ?-Penlac prescribed.  ?-Educated on hammertoes and adductovarus of digit and treatment options  ?-Discussed padding including toe caps and crest pads.  ?-Patient is ready to discuss surgery and would like to have it done as soon as possible. ?-Informed surgical risk consent was reviewed and read aloud to the patient.  I reviewed the films.  I have discussed my findings with the patient in great detail.  I have discussed all risks including but not limited to infection, stiffness, scarring, limp, disability, deformity, damage to blood vessels and nerves, numbness,  poor healing, need for braces, arthritis, chronic pain, amputation, death.  All benefits and realistic expectations discussed in great detail.  I have made no promises as to the outcome.  I have provided realistic expectations.  I have offered the patient a 2nd opinion, which they have declined and assured me they preferred to proceed despite the risks. ?  ? ? ? ?Lorenda Peck, DPM  ? ? ?

## 2021-04-09 ENCOUNTER — Telehealth: Payer: Self-pay

## 2021-04-09 NOTE — Telephone Encounter (Signed)
Received surgery paperwork from the [facility] office. Left a message for Breck to call and schedule surgery with Dr. Ralene Cork. ?

## 2021-04-25 ENCOUNTER — Telehealth: Payer: Self-pay | Admitting: Urology

## 2021-04-25 NOTE — Telephone Encounter (Signed)
DOS - 04/30/21 ? ?HAMMERTOE REPAIR 5TH LEFT --- [id number] ? ? ?AM[employer] MEDICAID EFFECTIVE DATE - 07/28/19 ? ? ?SPOKE WITH NATAYA J. WITH [employer] AND SHE STATED THAT CPT CODE 28413 HAS BEEN APPROVED, AUTH # [id number], GOOD FROM 04/30/21 - 04/30/21. ?

## 2021-04-30 ENCOUNTER — Other Ambulatory Visit: Payer: Self-pay | Admitting: Podiatry

## 2021-04-30 ENCOUNTER — Encounter: Payer: Self-pay | Admitting: Podiatry

## 2021-04-30 DIAGNOSIS — M2042 Other hammer toe(s) (acquired), left foot: Secondary | ICD-10-CM | POA: Diagnosis not present

## 2021-04-30 MED ORDER — ONDANSETRON HCL 4 MG PO TABS: 4.0000 mg | ORAL_TABLET | Freq: Three times a day (TID) | ORAL | 0 refills | Status: DC | PRN

## 2021-04-30 MED ORDER — CEPHALEXIN 500 MG PO CAPS: 500.0000 mg | ORAL_CAPSULE | Freq: Four times a day (QID) | ORAL | 0 refills | Status: AC

## 2021-05-06 ENCOUNTER — Ambulatory Visit (INDEPENDENT_AMBULATORY_CARE_PROVIDER_SITE_OTHER): Payer: Medicaid Other | Admitting: Podiatry

## 2021-05-06 ENCOUNTER — Encounter: Payer: Self-pay | Admitting: Podiatry

## 2021-05-06 ENCOUNTER — Ambulatory Visit (INDEPENDENT_AMBULATORY_CARE_PROVIDER_SITE_OTHER): Payer: Medicaid Other

## 2021-05-06 VITALS — Temp 96.9°F

## 2021-05-06 DIAGNOSIS — M205X2 Other deformities of toe(s) (acquired), left foot: Secondary | ICD-10-CM

## 2021-05-06 DIAGNOSIS — Z9889 Other specified postprocedural states: Secondary | ICD-10-CM

## 2021-05-06 NOTE — Progress Notes (Signed)
?Subjective:  ?Patient ID: [name], female    DOB: Aug 09, 1960,  MRN: [id number] ? ?No chief complaint on file. ? ? ?DOS: 04/30/21 ?Procedure: Left fifth digit de-rotational arthroplasty  ? ?61 y.o. female returns for POV#1. Patient doing well and not having too much pain.  ? ?Review of Systems: Negative except as noted in the HPI. Denies N/V/F/Ch. ? ?Past Medical History:  ?Diagnosis Date  ? Anxiety   ? Chronic lower back pain   ? Chronic pain syndrome   ? followed by pain clinic [facility] in [place]  ? COPD (chronic obstructive pulmonary disease) ([place])   ? DDD (degenerative disc disease), cervical   ? DDD (degenerative disc disease), lumbar   ? Depression   ? Diabetic peripheral neuropathy ([place])   ? hands and feet  ? Fibromyalgia   ? Full dentures   ? only wears upper  ? History of 2019 novel coronavirus disease (COVID-19)   ? 08-10-2018  per pt in May 2020,  recovered at home without supplemental oxygen, never admitted to hospital,  residual nasal congestion and loss of taste is coming back , no cough sore throat sob or difficultly breathing,  Negative covid test done 08-07-2018  ? History of pyelonephritis 09/2017  ? Hypothyroidism   ? IBS (irritable bowel syndrome)   ? no current med.  ? Left shoulder pain   ? Lumbar nerve root impingement   ? Lumbar post-laminectomy syndrome   ? OSA (obstructive sleep apnea)   ? 08-10-2018  per pt has used in 8 months  ? Osteoarthritis   ? Type II diabetes mellitus ([place])   ? followed by pcp  ? Wears glasses   ? ? ?Current Outpatient Medications:  ?  ACCU-CHEK GUIDE test strip, CHECK GLUCOSE TWO TIMES DAILY AT ALTERNATING TIMES. E11.65, Disp: , Rfl:  ?  Accu-Chek Softclix Lancets lancets, SMARTSIG:Topical 2-3 Times Daily, Disp: , Rfl:  ?  acetaminophen (TYLENOL) 325 MG tablet, Take 2 tablets (650 mg total) every 6 (six) hours by mouth., Disp: , Rfl:  ?  albuterol (PROVENTIL HFA;VENTOLIN HFA) 108 (90 Base) MCG/ACT inhaler, Inhale every 6 (six) hours as  needed into the lungs for wheezing or shortness of breath., Disp: , Rfl:  ?  atorvastatin (LIPITOR) 10 MG tablet, Take 10 mg by mouth daily., Disp: , Rfl:  ?  atorvastatin (LIPITOR) 20 MG tablet, Take by mouth., Disp: , Rfl:  ?  azelastine (ASTELIN) 0.1 % nasal spray, Place into the nose., Disp: , Rfl:  ?  Blood Glucose Monitoring Suppl (ACCU-CHEK GUIDE ME) w/Device KIT, E11.9USE AS DIRECTED, Disp: , Rfl:  ?  cetirizine (ZYRTEC) 10 MG tablet, Take 10 mg by mouth daily., Disp: , Rfl:  ?  ciclopirox (PENLAC) 8 % solution, Apply topically at bedtime. Apply over nail and surrounding skin. Apply daily over previous coat. After seven (7) days, may remove with alcohol and continue cycle., Disp: 6.6 mL, Rfl: 0 ?  cyclobenzaprine (FLEXERIL) 10 MG tablet, Take 1 tablet (10 mg total) by mouth 3 (three) times daily as needed for muscle spasms., Disp: 30 tablet, Rfl: 0 ?  cyclobenzaprine (FLEXERIL) 10 MG tablet, Take 1 tablet by mouth 3 (three) times daily as needed., Disp: , Rfl:  ?  dicyclomine (BENTYL) 10 MG capsule, Take by mouth., Disp: , Rfl:  ?  FLUoxetine (PROZAC) 20 MG tablet, Take 20 mg by mouth daily., Disp: , Rfl:  ?  FLUoxetine (PROZAC) 40 MG capsule, Take 1 capsule by mouth daily., Disp: ,  Rfl:  ?  fluticasone (FLONASE) 50 MCG/ACT nasal spray, Place into the nose., Disp: , Rfl:  ?  fluticasone furoate-vilanterol (BREO ELLIPTA) 100-25 MCG/INH AEPB, Inhale 1 puff into the lungs daily., Disp: , Rfl:  ?  HYDROcodone-acetaminophen (NORCO) 10-325 MG tablet, TAKE 1 TABLET BY MOUTH 2 (TWO) TIMES DAILY AS NEEDED FOR UP TO 30 DAYS FOR PAIN. (6/8), Disp: , Rfl:  ?  HYDROmorphone (DILAUDID) 2 MG tablet, Take 1-2 tablets (2-4 mg total) by mouth every 6 (six) hours as needed (Severe breakthrough pain.)., Disp: 30 tablet, Rfl: 0 ?  hydrOXYzine (ATARAX) 25 MG tablet, Take by mouth., Disp: , Rfl:  ?  levothyroxine (SYNTHROID) 50 MCG tablet, Take by mouth., Disp: , Rfl:  ?  levothyroxine (SYNTHROID) 75 MCG tablet, Take 75 mcg by  mouth daily., Disp: , Rfl:  ?  levothyroxine (SYNTHROID, LEVOTHROID) 50 MCG tablet, Take 50 mcg by mouth daily before breakfast., Disp: , Rfl:  ?  loratadine (CLARITIN) 10 MG tablet, Take 10 mg by mouth daily., Disp: , Rfl:  ?  meloxicam (MOBIC) 15 MG tablet, Take by mouth., Disp: , Rfl:  ?  metFORMIN (GLUCOPHAGE-XR) 500 MG 24 hr tablet, Take 500 mg by mouth 2 (two) times daily., Disp: , Rfl: 2 ?  metFORMIN (GLUCOPHAGE-XR) 500 MG 24 hr tablet, Take 1 tablet by mouth 2 (two) times daily., Disp: , Rfl:  ?  morphine (MSIR) 15 MG tablet, Take 15 mg by mouth 3 (three) times daily., Disp: , Rfl:  ?  omeprazole (PRILOSEC) 20 MG capsule, Take by mouth., Disp: , Rfl:  ?  ondansetron (ZOFRAN) 4 MG tablet, Take 1 tablet (4 mg total) by mouth every 8 (eight) hours as needed for nausea or vomiting., Disp: 20 tablet, Rfl: 0 ?  pioglitazone (ACTOS) 15 MG tablet, Take 15 mg by mouth daily., Disp: , Rfl: 0 ?  pregabalin (LYRICA) 200 MG capsule, Take 200 mg by mouth 3 (three) times daily., Disp: , Rfl:  ?  QUEtiapine (SEROQUEL) 100 MG tablet, Take 100 mg at bedtime by mouth., Disp: , Rfl:  ?  rOPINIRole (REQUIP) 0.25 MG tablet, Take 0.25 mg by mouth at bedtime. , Disp: , Rfl: 5 ?  triamcinolone ointment (KENALOG) 0.1 %, APPLY TO AFFECTED AREA 3 TIMES A DAY, Disp: , Rfl:  ?  zolpidem (AMBIEN) 10 MG tablet, Take 10 mg by mouth at bedtime as needed., Disp: , Rfl:  ? ?Social History  ? ?Tobacco Use  ?Smoking Status Former  ? Packs/day: 1.50  ? Years: 30.00  ? Pack years: 45.00  ? Types: Cigarettes  ? Quit date: 12/01/2007  ? Years since quitting: 13.4  ?Smokeless Tobacco Never  ? ? ?Allergies  ?Allergen Reactions  ? Adhesive [Tape] Hives  ? Latex Hives  ? Oxycodone Itching  ? ?Objective:  ?There were no vitals filed for this visit. ?There is no height or weight on file to calculate BMI. ?Constitutional Well developed. ?Well nourished.  ?Vascular Foot warm and well perfused. ?Capillary refill normal to all digits.   ?Neurologic Normal  speech. ?Oriented to person, place, and time. ?Epicritic sensation to light touch grossly present bilaterally.  ?Dermatologic Skin healing well without signs of infection. Skin edges well coapted without signs of infection.  ?Orthopedic: Tenderness to palpation noted about the surgical site.  ? ?Radiographs: Fifth digit arthroplasty in more rectus position  ?Assessment:  ?No diagnosis found. ?Plan:  ?Patient was evaluated and treated and all questions answered. ? ?S/p foot surgery left ?-Progressing as  expected post-operatively. ?-WB Status: WBAT in surgical shoe  ?-Sutures: intact. ?-Medications: n/a ?-Foot redressed. ?Follow-up in 2 weeks for suture removal.  ? ?No follow-ups on file.  ? ?

## 2021-05-07 ENCOUNTER — Telehealth: Payer: Self-pay | Admitting: Podiatry

## 2021-05-07 NOTE — Telephone Encounter (Signed)
Pt called asking what activities is she allowed to do until her next appt. Ride the exercise bike etc... ?

## 2021-05-07 NOTE — Telephone Encounter (Signed)
Tried to call the patient today and had to leave a message for the patient to call me back in [employer] office. Lattie Haw ?

## 2021-05-13 ENCOUNTER — Encounter: Payer: Self-pay | Admitting: Podiatry

## 2021-05-13 ENCOUNTER — Ambulatory Visit (INDEPENDENT_AMBULATORY_CARE_PROVIDER_SITE_OTHER): Payer: Medicaid Other | Admitting: Podiatry

## 2021-05-13 DIAGNOSIS — Z9889 Other specified postprocedural states: Secondary | ICD-10-CM

## 2021-05-13 MED ORDER — CEPHALEXIN 500 MG PO CAPS: 500.0000 mg | ORAL_CAPSULE | Freq: Four times a day (QID) | ORAL | 0 refills | Status: AC

## 2021-05-13 NOTE — Progress Notes (Signed)
?Subjective:  ?Patient ID: [name], female    DOB: 02-15-1960,  MRN: [id number] ? ?Chief Complaint  ?Patient presents with  ? Routine Post Op  ?  Burning and stinging on the left 5th toe and painful and I could not wait till next week  ? ? ?DOS: 04/30/21 ?Procedure: Left fifth digit de-rotational arthroplasty  ? ?61 y.o. female returns for POV#2. Patient relates increased burning and stinging in the left 5th toe. Was concerned.  ? ?Review of Systems: Negative except as noted in the HPI. Denies N/V/F/Ch. ? ?Past Medical History:  ?Diagnosis Date  ? Anxiety   ? Chronic lower back pain   ? Chronic pain syndrome   ? followed by pain clinic [facility] in [place]  ? COPD (chronic obstructive pulmonary disease) ([place])   ? DDD (degenerative disc disease), cervical   ? DDD (degenerative disc disease), lumbar   ? Depression   ? Diabetic peripheral neuropathy ([place])   ? hands and feet  ? Fibromyalgia   ? Full dentures   ? only wears upper  ? History of 2019 novel coronavirus disease (COVID-19)   ? 08-10-2018  per pt in May 2020,  recovered at home without supplemental oxygen, never admitted to hospital,  residual nasal congestion and loss of taste is coming back , no cough sore throat sob or difficultly breathing,  Negative covid test done 08-07-2018  ? History of pyelonephritis 09/2017  ? Hypothyroidism   ? IBS (irritable bowel syndrome)   ? no current med.  ? Left shoulder pain   ? Lumbar nerve root impingement   ? Lumbar post-laminectomy syndrome   ? OSA (obstructive sleep apnea)   ? 08-10-2018  per pt has used in 8 months  ? Osteoarthritis   ? Type II diabetes mellitus ([place])   ? followed by pcp  ? Wears glasses   ? ? ?Current Outpatient Medications:  ?  cephALEXin (KEFLEX) 500 MG capsule, Take 1 capsule (500 mg total) by mouth 4 (four) times daily for 10 days., Disp: 28 capsule, Rfl: 0 ?  ACCU-CHEK GUIDE test strip, CHECK GLUCOSE TWO TIMES DAILY AT ALTERNATING TIMES. E11.65, Disp: , Rfl:  ?  Accu-Chek  Softclix Lancets lancets, SMARTSIG:Topical 2-3 Times Daily, Disp: , Rfl:  ?  acetaminophen (TYLENOL) 325 MG tablet, Take 2 tablets (650 mg total) every 6 (six) hours by mouth., Disp: , Rfl:  ?  albuterol (PROVENTIL HFA;VENTOLIN HFA) 108 (90 Base) MCG/ACT inhaler, Inhale every 6 (six) hours as needed into the lungs for wheezing or shortness of breath., Disp: , Rfl:  ?  atorvastatin (LIPITOR) 20 MG tablet, Take by mouth., Disp: , Rfl:  ?  azelastine (ASTELIN) 0.1 % nasal spray, Place into the nose., Disp: , Rfl:  ?  Blood Glucose Monitoring Suppl (ACCU-CHEK GUIDE ME) w/Device KIT, E11.9USE AS DIRECTED, Disp: , Rfl:  ?  cetirizine (ZYRTEC) 10 MG tablet, Take 10 mg by mouth daily., Disp: , Rfl:  ?  ciclopirox (PENLAC) 8 % solution, Apply topically at bedtime. Apply over nail and surrounding skin. Apply daily over previous coat. After seven (7) days, may remove with alcohol and continue cycle., Disp: 6.6 mL, Rfl: 0 ?  cyclobenzaprine (FLEXERIL) 10 MG tablet, Take 1 tablet (10 mg total) by mouth 3 (three) times daily as needed for muscle spasms., Disp: 30 tablet, Rfl: 0 ?  dicyclomine (BENTYL) 10 MG capsule, Take by mouth., Disp: , Rfl:  ?  FLUoxetine (PROZAC) 40 MG capsule, Take 1 capsule by mouth  daily., Disp: , Rfl:  ?  fluticasone (FLONASE) 50 MCG/ACT nasal spray, Place into the nose., Disp: , Rfl:  ?  fluticasone furoate-vilanterol (BREO ELLIPTA) 100-25 MCG/INH AEPB, Inhale 1 puff into the lungs daily., Disp: , Rfl:  ?  HYDROcodone-acetaminophen (NORCO) 10-325 MG tablet, TAKE 1 TABLET BY MOUTH 2 (TWO) TIMES DAILY AS NEEDED FOR UP TO 30 DAYS FOR PAIN. (6/8), Disp: , Rfl:  ?  hydrOXYzine (ATARAX) 25 MG tablet, Take by mouth., Disp: , Rfl:  ?  levothyroxine (SYNTHROID) 50 MCG tablet, Take by mouth., Disp: , Rfl:  ?  loratadine (CLARITIN) 10 MG tablet, Take 10 mg by mouth daily., Disp: , Rfl:  ?  meloxicam (MOBIC) 15 MG tablet, Take by mouth., Disp: , Rfl:  ?  metFORMIN (GLUCOPHAGE-XR) 500 MG 24 hr tablet, Take 1 tablet  by mouth 2 (two) times daily., Disp: , Rfl:  ?  morphine (MSIR) 15 MG tablet, Take 15 mg by mouth 3 (three) times daily., Disp: , Rfl:  ?  omeprazole (PRILOSEC) 20 MG capsule, Take by mouth., Disp: , Rfl:  ?  ondansetron (ZOFRAN) 4 MG tablet, Take 1 tablet (4 mg total) by mouth every 8 (eight) hours as needed for nausea or vomiting., Disp: 20 tablet, Rfl: 0 ?  pioglitazone (ACTOS) 15 MG tablet, Take 15 mg by mouth daily., Disp: , Rfl: 0 ?  pregabalin (LYRICA) 200 MG capsule, Take 200 mg by mouth 3 (three) times daily., Disp: , Rfl:  ?  QUEtiapine (SEROQUEL) 100 MG tablet, Take 100 mg at bedtime by mouth., Disp: , Rfl:  ?  rOPINIRole (REQUIP) 0.25 MG tablet, Take 0.25 mg by mouth at bedtime. , Disp: , Rfl: 5 ?  triamcinolone ointment (KENALOG) 0.1 %, APPLY TO AFFECTED AREA 3 TIMES A DAY, Disp: , Rfl:  ?  zolpidem (AMBIEN) 10 MG tablet, Take 10 mg by mouth at bedtime as needed., Disp: , Rfl:  ? ?Social History  ? ?Tobacco Use  ?Smoking Status Former  ? Packs/day: 1.50  ? Years: 30.00  ? Pack years: 45.00  ? Types: Cigarettes  ? Quit date: 12/01/2007  ? Years since quitting: 13.4  ?Smokeless Tobacco Never  ? ? ?Allergies  ?Allergen Reactions  ? Adhesive [Tape] Hives  ? Latex Hives  ? Oxycodone Itching  ? ?Objective:  ?There were no vitals filed for this visit. ?There is no height or weight on file to calculate BMI. ?Constitutional Well developed. ?Well nourished.  ?Vascular Foot warm and well perfused. ?Capillary refill normal to all digits.   ?Neurologic Normal speech. ?Oriented to person, place, and time. ?Epicritic sensation to light touch grossly present bilaterally.  ?Dermatologic Skin healing well without signs of infection. Skin edges well coapted without signs of infection. Mild erythema around incision. No drainage.   ?Orthopedic: Tenderness to palpation noted about the surgical site.  ? ?Radiographs: Fifth digit arthroplasty in more rectus position  ?Assessment:  ? ?1. Post-operative state   ? ?Plan:  ?Patient  was evaluated and treated and all questions answered. ? ?S/p foot surgery left ?-Progressing as expected post-operatively. ?-WB Status: WBAT in surgical shoe  ?-Sutures: intact. ?-Medications: keflex sent to pharmacy.  ?-Foot redressed. ?Follow-up in 1 weeks for suture removal.  ? ?No follow-ups on file.  ? ?

## 2021-05-20 ENCOUNTER — Ambulatory Visit (INDEPENDENT_AMBULATORY_CARE_PROVIDER_SITE_OTHER): Payer: Medicaid Other | Admitting: Podiatry

## 2021-05-20 ENCOUNTER — Encounter: Payer: Self-pay | Admitting: Podiatry

## 2021-05-20 DIAGNOSIS — B351 Tinea unguium: Secondary | ICD-10-CM

## 2021-05-20 DIAGNOSIS — Z9889 Other specified postprocedural states: Secondary | ICD-10-CM

## 2021-05-20 DIAGNOSIS — M205X2 Other deformities of toe(s) (acquired), left foot: Secondary | ICD-10-CM

## 2021-05-20 MED ORDER — TERBINAFINE HCL 250 MG PO TABS: 250.0000 mg | ORAL_TABLET | Freq: Every day | ORAL | 2 refills | Status: AC

## 2021-05-20 MED ORDER — CICLOPIROX 8 % EX SOLN: Freq: Every day | CUTANEOUS | 0 refills | Status: DC

## 2021-05-20 NOTE — Progress Notes (Signed)
?Subjective:  ?Patient ID: [name], female    DOB: August 02, 1960,  MRN: [id number] ? ?Chief Complaint  ?Patient presents with  ? Routine Post Op  ?  I am doing better and I am ready to get the stitches out   ? ? ?DOS: 04/30/21 ?Procedure: Left fifth digit de-rotational arthroplasty  ? ?61 y.o. female returns for POV#3. Patient relates toe is doing well with no pain.  ?Concerned about her fungal nails and ingrown toenails.  ? ?Review of Systems: Negative except as noted in the HPI. Denies N/V/F/Ch. ? ?Past Medical History:  ?Diagnosis Date  ? Anxiety   ? Chronic lower back pain   ? Chronic pain syndrome   ? followed by pain clinic [facility] in [place]  ? COPD (chronic obstructive pulmonary disease) ([place])   ? DDD (degenerative disc disease), cervical   ? DDD (degenerative disc disease), lumbar   ? Depression   ? Diabetic peripheral neuropathy ([place])   ? hands and feet  ? Fibromyalgia   ? Full dentures   ? only wears upper  ? History of 2019 novel coronavirus disease (COVID-19)   ? 08-10-2018  per pt in May 2020,  recovered at home without supplemental oxygen, never admitted to hospital,  residual nasal congestion and loss of taste is coming back , no cough sore throat sob or difficultly breathing,  Negative covid test done 08-07-2018  ? History of pyelonephritis 09/2017  ? Hypothyroidism   ? IBS (irritable bowel syndrome)   ? no current med.  ? Left shoulder pain   ? Lumbar nerve root impingement   ? Lumbar post-laminectomy syndrome   ? OSA (obstructive sleep apnea)   ? 08-10-2018  per pt has used in 8 months  ? Osteoarthritis   ? Type II diabetes mellitus ([place])   ? followed by pcp  ? Wears glasses   ? ? ?Current Outpatient Medications:  ?  ACCU-CHEK GUIDE test strip, CHECK GLUCOSE TWO TIMES DAILY AT ALTERNATING TIMES. E11.65, Disp: , Rfl:  ?  Accu-Chek Softclix Lancets lancets, SMARTSIG:Topical 2-3 Times Daily, Disp: , Rfl:  ?  acetaminophen (TYLENOL) 325 MG tablet, Take 2 tablets (650 mg total)  every 6 (six) hours by mouth., Disp: , Rfl:  ?  albuterol (PROVENTIL HFA;VENTOLIN HFA) 108 (90 Base) MCG/ACT inhaler, Inhale every 6 (six) hours as needed into the lungs for wheezing or shortness of breath., Disp: , Rfl:  ?  atorvastatin (LIPITOR) 20 MG tablet, Take by mouth., Disp: , Rfl:  ?  azelastine (ASTELIN) 0.1 % nasal spray, Place into the nose., Disp: , Rfl:  ?  Blood Glucose Monitoring Suppl (ACCU-CHEK GUIDE ME) w/Device KIT, E11.9USE AS DIRECTED, Disp: , Rfl:  ?  cephALEXin (KEFLEX) 500 MG capsule, Take 1 capsule (500 mg total) by mouth 4 (four) times daily for 10 days., Disp: 28 capsule, Rfl: 0 ?  cetirizine (ZYRTEC) 10 MG tablet, Take 10 mg by mouth daily., Disp: , Rfl:  ?  ciclopirox (PENLAC) 8 % solution, Apply topically at bedtime. Apply over nail and surrounding skin. Apply daily over previous coat. After seven (7) days, may remove with alcohol and continue cycle., Disp: 6.6 mL, Rfl: 0 ?  cyclobenzaprine (FLEXERIL) 10 MG tablet, Take 1 tablet (10 mg total) by mouth 3 (three) times daily as needed for muscle spasms., Disp: 30 tablet, Rfl: 0 ?  dicyclomine (BENTYL) 10 MG capsule, Take by mouth., Disp: , Rfl:  ?  FLUoxetine (PROZAC) 40 MG capsule, Take 1 capsule by  mouth daily., Disp: , Rfl:  ?  fluticasone (FLONASE) 50 MCG/ACT nasal spray, Place into the nose., Disp: , Rfl:  ?  fluticasone furoate-vilanterol (BREO ELLIPTA) 100-25 MCG/INH AEPB, Inhale 1 puff into the lungs daily., Disp: , Rfl:  ?  HYDROcodone-acetaminophen (NORCO) 10-325 MG tablet, TAKE 1 TABLET BY MOUTH 2 (TWO) TIMES DAILY AS NEEDED FOR UP TO 30 DAYS FOR PAIN. (6/8), Disp: , Rfl:  ?  hydrOXYzine (ATARAX) 25 MG tablet, Take by mouth., Disp: , Rfl:  ?  levothyroxine (SYNTHROID) 50 MCG tablet, Take by mouth., Disp: , Rfl:  ?  loratadine (CLARITIN) 10 MG tablet, Take 10 mg by mouth daily., Disp: , Rfl:  ?  meloxicam (MOBIC) 15 MG tablet, Take by mouth., Disp: , Rfl:  ?  metFORMIN (GLUCOPHAGE-XR) 500 MG 24 hr tablet, Take 1 tablet by mouth  2 (two) times daily., Disp: , Rfl:  ?  morphine (MSIR) 15 MG tablet, Take 15 mg by mouth 3 (three) times daily., Disp: , Rfl:  ?  omeprazole (PRILOSEC) 20 MG capsule, Take by mouth., Disp: , Rfl:  ?  ondansetron (ZOFRAN) 4 MG tablet, Take 1 tablet (4 mg total) by mouth every 8 (eight) hours as needed for nausea or vomiting., Disp: 20 tablet, Rfl: 0 ?  pioglitazone (ACTOS) 15 MG tablet, Take 15 mg by mouth daily., Disp: , Rfl: 0 ?  pregabalin (LYRICA) 200 MG capsule, Take 200 mg by mouth 3 (three) times daily., Disp: , Rfl:  ?  QUEtiapine (SEROQUEL) 100 MG tablet, Take 100 mg at bedtime by mouth., Disp: , Rfl:  ?  rOPINIRole (REQUIP) 0.25 MG tablet, Take 0.25 mg by mouth at bedtime. , Disp: , Rfl: 5 ?  triamcinolone ointment (KENALOG) 0.1 %, APPLY TO AFFECTED AREA 3 TIMES A DAY, Disp: , Rfl:  ?  zolpidem (AMBIEN) 10 MG tablet, Take 10 mg by mouth at bedtime as needed., Disp: , Rfl:  ? ?Social History  ? ?Tobacco Use  ?Smoking Status Former  ? Packs/day: 1.50  ? Years: 30.00  ? Pack years: 45.00  ? Types: Cigarettes  ? Quit date: 12/01/2007  ? Years since quitting: 13.4  ?Smokeless Tobacco Never  ? ? ?Allergies  ?Allergen Reactions  ? Adhesive [Tape] Hives  ? Latex Hives  ? Oxycodone Itching  ? ?Objective:  ?There were no vitals filed for this visit. ?There is no height or weight on file to calculate BMI. ?Constitutional Well developed. ?Well nourished.  ?Vascular Foot warm and well perfused. ?Capillary refill normal to all digits.   ?Neurologic Normal speech. ?Oriented to person, place, and time. ?Epicritic sensation to light touch grossly present bilaterally.  ?Dermatologic Skin healing well without signs of infection. Skin edges well coapted without signs of infection. Mild erythema around incision. No drainage.  Nials 1-4 bilateral are thickened and discolored with subungual debris  ?Orthopedic: Tenderness to palpation noted about the surgical site.  ? ?Radiographs: Fifth digit arthroplasty in more rectus position   ?Assessment:  ? ?1. Post-operative state   ?2. Adductovarus rotation of toe, acquired, left   ?3. Onychomycosis   ? ?Plan:  ?Patient was evaluated and treated and all questions answered. ? ?S/p foot surgery left ?-Progressing as expected post-operatively. ?-WB Status: WBAT in regular shoes ?-Sutures: removed without incident.  ?-Medications: n/a ?-Foot redressed. ? ? ?-Discussed treatment options for painful dystrophic nails  ?-Discussed fungal nail treatment options including oral, topical, and laser treatments.  ?-Refill for penlac provided.  ?-Will start on Lamisil. Recently blood  work was normal.  ?Follow-up in 3 months for foot check and fungal nail check.   ? ?No follow-ups on file.  ? ?

## 2021-05-24 ENCOUNTER — Other Ambulatory Visit: Payer: Self-pay | Admitting: Podiatry

## 2021-08-19 ENCOUNTER — Ambulatory Visit (INDEPENDENT_AMBULATORY_CARE_PROVIDER_SITE_OTHER): Payer: Medicaid Other | Admitting: Podiatry

## 2021-08-19 DIAGNOSIS — Z91199 Patient's noncompliance with other medical treatment and regimen due to unspecified reason: Secondary | ICD-10-CM

## 2021-08-19 NOTE — Progress Notes (Signed)
No show

## 2021-08-26 ENCOUNTER — Other Ambulatory Visit: Payer: Self-pay | Admitting: Podiatry

## 2021-09-21 DIAGNOSIS — R079 Chest pain, unspecified: Secondary | ICD-10-CM | POA: Diagnosis not present

## 2021-12-05 ENCOUNTER — Other Ambulatory Visit: Payer: Self-pay | Admitting: Orthopedic Surgery

## 2021-12-11 ENCOUNTER — Encounter: Payer: Self-pay | Admitting: Cardiology

## 2021-12-13 ENCOUNTER — Encounter: Payer: Self-pay | Admitting: Cardiology

## 2021-12-13 ENCOUNTER — Ambulatory Visit: Payer: Medicaid Other | Attending: Cardiology | Admitting: Cardiology

## 2021-12-13 VITALS — BP 100/62 | HR 95 | Ht 66.0 in | Wt 203.8 lb

## 2021-12-13 DIAGNOSIS — E1169 Type 2 diabetes mellitus with other specified complication: Secondary | ICD-10-CM | POA: Diagnosis not present

## 2021-12-13 DIAGNOSIS — G4733 Obstructive sleep apnea (adult) (pediatric): Secondary | ICD-10-CM

## 2021-12-13 DIAGNOSIS — Z01818 Encounter for other preprocedural examination: Secondary | ICD-10-CM

## 2021-12-13 DIAGNOSIS — I2584 Coronary atherosclerosis due to calcified coronary lesion: Secondary | ICD-10-CM

## 2021-12-13 DIAGNOSIS — R931 Abnormal findings on diagnostic imaging of heart and coronary circulation: Secondary | ICD-10-CM | POA: Diagnosis not present

## 2021-12-13 DIAGNOSIS — Z01810 Encounter for preprocedural cardiovascular examination: Secondary | ICD-10-CM

## 2021-12-13 DIAGNOSIS — E669 Obesity, unspecified: Secondary | ICD-10-CM

## 2021-12-13 DIAGNOSIS — I2510 Atherosclerotic heart disease of native coronary artery without angina pectoris: Secondary | ICD-10-CM | POA: Diagnosis not present

## 2021-12-13 HISTORY — DX: Abnormal findings on diagnostic imaging of heart and coronary circulation: R93.1

## 2021-12-13 NOTE — H&P (View-Only) (Signed)
Cardiology Consultation:    Date:  12/13/2021   ID:  [name], DOB 1960-12-04, MRN [id number]  PCP:  Erskine Emery, NP  Cardiologist:  Gypsy Balsam, MD   Referring MD: Erskine Emery, NP   Chief Complaint  Patient presents with   Abnormal CT    Show 3 vessel calcifications     Clearance 12/23/2021    Right Total Hip Replacement, Dr. Luiz Blare     History of Present Illness:    [name] is a 61 y.o. female who is being seen today for the evaluation of cardiovascular preop evaluation at the request of Erskine Emery, NP. with past medical history significant for chronic pains, COPD, she is a neck smoker, essential hypertension, dyslipidemia, diabetes mellitus.  She was referred to [place] because she required hip replacement surgery.  In the summertime she did have CT of her chest performed which showed calcification of the coronary arteries.  She would like to be evaluated before surgery make sure it safe for her to participate in surgery.  She denies have any cardiac complaints.  There is no chest pain tightness squeezing pressure burning chest but at the same time her ability to exercise is very limited because of orthopedic issues with her hip.  She is tells me that she is able to write some bike stationary bike the days and she is doing quite well with that.  She is not on any special diet she is trying to lose some weight and does have family history of coronary artery disease some premature.   Past Medical History:  Diagnosis Date   Allergic rhinitis    Anxiety    Chronic lower back pain    Chronic pain syndrome    followed by pain clinic [facility] in [place]   COPD (chronic obstructive pulmonary disease) ([facility])    DDD (degenerative disc disease), cervical    DDD (degenerative disc disease), lumbar    Depression    Diabetic peripheral neuropathy ([facility])    hands and feet   Essential (primary) hypertension    Fibromyalgia    Full  dentures    only wears upper   GERD without esophagitis    History of 2019 novel coronavirus disease (COVID-19)    08-10-2018  per pt in May 2020,  recovered at home without supplemental oxygen, never admitted to hospital,  residual nasal congestion and loss of taste is coming back , no cough sore throat sob or difficultly breathing,  Negative covid test done 08-07-2018   History of pyelonephritis 09/2017   Hyperlipidemia    Hypothyroidism    IBS (irritable bowel syndrome)    no current med.   Left shoulder pain    Lumbar nerve root impingement    Lumbar post-laminectomy syndrome    Major depressive disorder    Obesity    OSA (obstructive sleep apnea)    08-10-2018  per pt has used in 8 months   Osteoarthritis    Polyneuropathy    PTSD (post-traumatic stress disorder)    Type II diabetes mellitus (HCC)    followed by pcp   Vitamin D deficiency    Wears glasses     Past Surgical History:  Procedure Laterality Date   BLADDER SUSPENSION  2006   CARPAL TUNNEL RELEASE Bilateral right 07/22/2001;  left 01-05-2003  dr Lori Pont @[facility]    CHOLECYSTECTOMY     COLONOSCOPY W/ POLYPECTOMY     DILATION AND CURETTAGE OF UTERUS     LAPAROSCOPIC  CHOLECYSTECTOMY  1992   LUMBAR LAMINECTOMY  1997   L4 --5   MULTIPLE EXTRACTIONS WITH ALVEOLOPLASTY N/A 11/26/2012   Procedure: MULTIPLE EXTRACTION WITH ALVEOLOPLASTY;  Surgeon: [name], DDS;  Location: [facility];  Service: Oral Surgery;  Laterality: N/A;   SHOULDER ARTHROSCOPY WITH ROTATOR CUFF REPAIR AND SUBACROMIAL DECOMPRESSION Left 08/11/2018   Procedure: SHOULDER ARTHROSCOPY WITH ROTATOR CUFF REPAIR AND SUBACROMIAL DECOMPRESSION and DISTAL CLAVICLE ,DEBRIDEMENT ANTERIOR LABRIL TEAR;  Surgeon: Jodi Geralds, MD;  Location: [facility];  Service: Orthopedics;  Laterality: Left;   TONSILLECTOMY  age 89   TOTAL HIP ARTHROPLASTY Left 07/10/2014   Procedure: TOTAL HIP ARTHROPLASTY ANTERIOR APPROACH;  Surgeon: Jodi Geralds, MD;  Location:  [facility];  Service: Orthopedics;  Laterality: Left;   TRIGGER FINGER RELEASE Right 2018   thumb   TRIGGER FINGER RELEASE Left 10/22/2015   Procedure: LEFT TRIGGER THUMB RELEASE;  Surgeon: Mack Hook, MD;  Location: [facility];  Service: Orthopedics;  Laterality: Left;   TUBAL LIGATION  yrs ago   ULNAR NERVE TRANSPOSITION Right 03/27/2014   Procedure: RIGHT ULNAR NEUROPLASTY AT THE ELBOW;  Surgeon: Jodi Marble, MD;  Location: [facility];  Service: Orthopedics;  Laterality: Right;   ULNAR NERVE TRANSPOSITION Left 12/08/2016   Procedure: LEFT ULNAR NEUROPLASTY AT THE ELBOW;  Surgeon: Mack Hook, MD;  Location: [facility];  Service: Orthopedics;  Laterality: Left;   UPPER GASTROINTESTINAL ENDOSCOPY     VAGINAL HYSTERECTOMY  2006   w/  ANTERIOR AND POSTERIOR REPAIR WITH SLING SUSPENSION    Current Medications: Current Meds  Medication Sig   acetaminophen (TYLENOL) 500 MG tablet Take 1,000 mg by mouth every 6 (six) hours as needed for moderate pain.   albuterol (PROVENTIL HFA;VENTOLIN HFA) 108 (90 Base) MCG/ACT inhaler Inhale 2 puffs into the lungs every 6 (six) hours as needed for wheezing or shortness of breath.   atorvastatin (LIPITOR) 20 MG tablet Take 20 mg by mouth daily.   budesonide-formoterol (SYMBICORT) 160-4.5 MCG/ACT inhaler Inhale 1 puff into the lungs daily.   cyclobenzaprine (FLEXERIL) 10 MG tablet Take 1 tablet (10 mg total) by mouth 3 (three) times daily as needed for muscle spasms.   FLUoxetine (PROZAC) 40 MG capsule Take 40 mg by mouth daily.   fluticasone (FLONASE) 50 MCG/ACT nasal spray Place 2 sprays into both nostrils daily.   HYDROcodone-acetaminophen (NORCO) 10-325 MG tablet Take 1 tablet by mouth 2 (two) times daily as needed for moderate pain.   levothyroxine (SYNTHROID) 75 MCG tablet Take 75 mcg by mouth daily before breakfast.   loratadine (CLARITIN) 10 MG tablet Take 10 mg by mouth daily as needed for allergies  or rhinitis.   morphine (MS CONTIN) 15 MG 12 hr tablet Take 15 mg by mouth 3 (three) times daily.   Multiple Vitamins-Minerals (ONE A DAY WOMEN 50 PLUS PO) Take 1 tablet by mouth daily.   OZEMPIC, 2 MG/DOSE, 8 MG/3ML SOPN Inject 2 mg into the skin every Tuesday.   pantoprazole (PROTONIX) 40 MG tablet Take 40 mg by mouth 2 (two) times daily.   pregabalin (LYRICA) 200 MG capsule Take 200 mg by mouth 3 (three) times daily.   QUEtiapine (SEROQUEL) 100 MG tablet Take 100 mg at bedtime by mouth.   rOPINIRole (REQUIP) 0.25 MG tablet Take 0.25 mg by mouth at bedtime.    tiotropium (SPIRIVA) 18 MCG inhalation capsule Place 18 mcg into inhaler and inhale daily.   VASCEPA 1 g capsule  Take 1 g by mouth 2 (two) times daily.   zolpidem (AMBIEN) 10 MG tablet Take 5 mg by mouth at bedtime.     Allergies:   Adhesive [tape], Latex, Dilaudid [hydromorphone], and Oxycodone   Social History   Socioeconomic History   Marital status: Divorced    Spouse name: Not on file   Number of children: Not on file   Years of education: Not on file   Highest education level: Not on file  Occupational History   Not on file  Tobacco Use   Smoking status: Former    Packs/day: 1.50    Years: 30.00    Total pack years: 45.00    Types: Cigarettes    Quit date: 12/01/2007    Years since quitting: 14.0   Smokeless tobacco: Never  Vaping Use   Vaping Use: Never used  Substance and Sexual Activity   Alcohol use: Not Currently   Drug use: Never   Sexual activity: Yes    Birth control/protection: Surgical  Other Topics Concern   Not on file  Social History Narrative   Not on file   Social Determinants of Health   Financial Resource Strain: Not on file  Food Insecurity: Not on file  Transportation Needs: Not on file  Physical Activity: Not on file  Stress: Not on file  Social Connections: Not on file     Family History: The patient's family history includes Diabetes in her mother; Heart disease in her  mother; Hypertension in her mother; Mental illness in her mother. ROS:   Please see the history of present illness.    All 14 point review of systems negative except as described per history of present illness.  EKGs/Labs/Other Studies Reviewed:    The following studies were reviewed today: Echocardiogram done in the hospital in August 2023 showed no segmental wall motion abnormality, normal left ventricle ejection fraction trivial MR trivial TR overall good results  EKG:  EKG is  ordered today.  The ekg ordered today demonstrates normal sinus rhythm left atrium enlargement, right axis deviation, nonspecific ST segment changes  Recent Labs: No results found for requested labs within last 365 days.  Recent Lipid Panel No results found for: "CHOL", "TRIG", "HDL", "CHOLHDL", "VLDL", "LDLCALC", "LDLDIRECT"  Physical Exam:    VS:  BP 100/62 (BP Location: Left Arm, Patient Position: Sitting)   Pulse 95   Ht 5' 6" (1.676 m)   Wt 203 lb 12.8 oz (92.4 kg)   SpO2 98%   BMI 32.89 kg/m     Wt Readings from Last 3 Encounters:  12/13/21 203 lb 12.8 oz (92.4 kg)  11/18/21 198 lb (89.8 kg)  08/03/19 240 lb (108.9 kg)     GEN:  Well nourished, well developed in no acute distress HEENT: Normal NECK: No JVD; No carotid bruits LYMPHATICS: No lymphadenopathy CARDIAC: RRR, no murmurs, no rubs, no gallops RESPIRATORY:  Clear to auscultation without rales, wheezing or rhonchi  ABDOMEN: Soft, non-tender, non-distended MUSCULOSKELETAL:  No edema; No deformity  SKIN: Warm and dry NEUROLOGIC:  Alert and oriented x 3 PSYCHIATRIC:  Normal affect   ASSESSMENT:    1. Coronary artery calcification   2. Pre-op evaluation   3. Elevated coronary artery calcium score   4. Preop cardiovascular exam   5. Diabetes mellitus type 2 in obese (HCC)   6. Obstructive sleep apnea    PLAN:    In order of problems listed above:  Cardiovascular evaluation in this lady for elective hip   replacement surgery.   She does have multiple risk factors of coronary artery disease and already present coronary calcification noted on chest CT.  Because of incoming surgery and the fact that she cannot exercise well we need to have objective assessment of her coronary arteries which I think will be performed best with stress testing.  She was scheduled to have a Lexiscan. Elevated calcium score.  We will of course we will try to rule out obstructive disease if the evaluation will be negative then we need to consider doing on our efforts on modification of her risk factors.  I asked her to start taking baby aspirin however I want her to do it after surgery, I will also contact primary care physician to get her fasting lipid profile.  I would like to see her LDL less than 70. Diabetes mellitus has been followed by internal medicine team.  Apparently stable. Obstructive sleep apnea followed by ENT medicine team   Medication Adjustments/Labs and Tests Ordered: Current medicines are reviewed at length with the patient today.  Concerns regarding medicines are outlined above.  Orders Placed This Encounter  Procedures   MYOCARDIAL PERFUSION IMAGING   EKG 12-Lead   No orders of the defined types were placed in this encounter.   Signed, Georgeanna Lea, MD, [facility]. 12/13/2021 2:44 PM    [facility]

## 2021-12-13 NOTE — Patient Instructions (Signed)
Medication Instructions:  ?Your physician recommends that you continue on your current medications as directed. Please refer to the Current Medication list given to you today.  ?*If you need a refill on your cardiac medications before your next appointment, please call your pharmacy* ? ? ?Lab Work: ?None Ordered ?If you have labs (blood work) drawn today and your tests are completely normal, you will receive your results only by: ?MyChart Message (if you have MyChart) OR ?A paper copy in the mail ?If you have any lab test that is abnormal or we need to change your treatment, we will call you to review the results. ? ? ?Testing/Procedures: ?Your physician has requested that you have a lexiscan myoview. For further information please visit [email or web address]. Please follow instruction sheet, as given. ? ?The test will take approximately 3 to 4 hours to complete; you may bring reading material.  If someone comes with you to your appointment, they will need to remain in the main lobby due to limited space in the testing area. **If you are pregnant or breastfeeding, please notify the nuclear lab prior to your appointment** ? ?How to prepare for your Myocardial Perfusion Test: ?Do not eat or drink 3 hours prior to your test, except you may have water. ?Do not consume products containing caffeine (regular or decaffeinated) 12 hours prior to your test. (ex: coffee, chocolate, sodas, tea). ?Do bring a list of your current medications with you.  If not listed below, you may take your medications as normal. ?Do wear comfortable clothes (no dresses or overalls) and walking shoes, tennis shoes preferred (No heels or open toe shoes are allowed). ?Do NOT wear cologne, perfume, aftershave, or lotions (deodorant is allowed). ?If these instructions are not followed, your test will have to be rescheduled.   ? ? ?Follow-Up: ?At [facility], you and your health needs are our priority.  As part of our continuing mission to provide  you with exceptional heart care, we have created designated Provider Care Teams.  These Care Teams include your primary Cardiologist (physician) and Advanced Practice Providers (APPs -  Physician Assistants and Nurse Practitioners) who all work together to provide you with the care you need, when you need it. ? ?We recommend signing up for the patient portal called "MyChart".  Sign up information is provided on this After Visit Summary.  MyChart is used to connect with patients for Virtual Visits (Telemedicine).  Patients are able to view lab/test results, encounter notes, upcoming appointments, etc.  Non-urgent messages can be sent to your provider as well.   ?To learn more about what you can do with MyChart, go to [email or web address].   ? ?Your next appointment:   ?3 month(s) ? ?The format for your next appointment:   ?In Person ? ?Provider:   ?Robert Krasowski, MD  ? ? ?Other Instructions ?NA  ?

## 2021-12-13 NOTE — Progress Notes (Signed)
Cardiology Consultation:    Date:  12/13/2021   ID:  [name], DOB 1960-12-04, MRN [id number]  PCP:  Erskine Emery, NP  Cardiologist:  Gypsy Balsam, MD   Referring MD: Erskine Emery, NP   Chief Complaint  Patient presents with   Abnormal CT    Show 3 vessel calcifications     Clearance 12/23/2021    Right Total Hip Replacement, Dr. Luiz Blare     History of Present Illness:    [name] is a 61 y.o. female who is being seen today for the evaluation of cardiovascular preop evaluation at the request of Erskine Emery, NP. with past medical history significant for chronic pains, COPD, she is a neck smoker, essential hypertension, dyslipidemia, diabetes mellitus.  She was referred to [place] because she required hip replacement surgery.  In the summertime she did have CT of her chest performed which showed calcification of the coronary arteries.  She would like to be evaluated before surgery make sure it safe for her to participate in surgery.  She denies have any cardiac complaints.  There is no chest pain tightness squeezing pressure burning chest but at the same time her ability to exercise is very limited because of orthopedic issues with her hip.  She is tells me that she is able to write some bike stationary bike the days and she is doing quite well with that.  She is not on any special diet she is trying to lose some weight and does have family history of coronary artery disease some premature.   Past Medical History:  Diagnosis Date   Allergic rhinitis    Anxiety    Chronic lower back pain    Chronic pain syndrome    followed by pain clinic [facility] in [place]   COPD (chronic obstructive pulmonary disease) (HCC)    DDD (degenerative disc disease), cervical    DDD (degenerative disc disease), lumbar    Depression    Diabetic peripheral neuropathy (HCC)    hands and feet   Essential (primary) hypertension    Fibromyalgia    Full  dentures    only wears upper   GERD without esophagitis    History of 2019 novel coronavirus disease (COVID-19)    08-10-2018  per pt in May 2020,  recovered at home without supplemental oxygen, never admitted to hospital,  residual nasal congestion and loss of taste is coming back , no cough sore throat sob or difficultly breathing,  Negative covid test done 08-07-2018   History of pyelonephritis 09/2017   Hyperlipidemia    Hypothyroidism    IBS (irritable bowel syndrome)    no current med.   Left shoulder pain    Lumbar nerve root impingement    Lumbar post-laminectomy syndrome    Major depressive disorder    Obesity    OSA (obstructive sleep apnea)    08-10-2018  per pt has used in 8 months   Osteoarthritis    Polyneuropathy    PTSD (post-traumatic stress disorder)    Type II diabetes mellitus (HCC)    followed by pcp   Vitamin D deficiency    Wears glasses     Past Surgical History:  Procedure Laterality Date   BLADDER SUSPENSION  2006   CARPAL TUNNEL RELEASE Bilateral right 07/22/2001;  left 01-05-2003  dr Lori Pont @[facility]    CHOLECYSTECTOMY     COLONOSCOPY W/ POLYPECTOMY     DILATION AND CURETTAGE OF UTERUS     LAPAROSCOPIC  CHOLECYSTECTOMY  1992   LUMBAR LAMINECTOMY  1997   L4 --5   MULTIPLE EXTRACTIONS WITH ALVEOLOPLASTY N/A 11/26/2012   Procedure: MULTIPLE EXTRACTION WITH ALVEOLOPLASTY;  Surgeon: [name], DDS;  Location: [facility];  Service: Oral Surgery;  Laterality: N/A;   SHOULDER ARTHROSCOPY WITH ROTATOR CUFF REPAIR AND SUBACROMIAL DECOMPRESSION Left 08/11/2018   Procedure: SHOULDER ARTHROSCOPY WITH ROTATOR CUFF REPAIR AND SUBACROMIAL DECOMPRESSION and DISTAL CLAVICLE ,DEBRIDEMENT ANTERIOR LABRIL TEAR;  Surgeon: Jodi Geralds, MD;  Location: [facility];  Service: Orthopedics;  Laterality: Left;   TONSILLECTOMY  age 89   TOTAL HIP ARTHROPLASTY Left 07/10/2014   Procedure: TOTAL HIP ARTHROPLASTY ANTERIOR APPROACH;  Surgeon: Jodi Geralds, MD;  Location:  [facility];  Service: Orthopedics;  Laterality: Left;   TRIGGER FINGER RELEASE Right 2018   thumb   TRIGGER FINGER RELEASE Left 10/22/2015   Procedure: LEFT TRIGGER THUMB RELEASE;  Surgeon: Mack Hook, MD;  Location: [facility];  Service: Orthopedics;  Laterality: Left;   TUBAL LIGATION  yrs ago   ULNAR NERVE TRANSPOSITION Right 03/27/2014   Procedure: RIGHT ULNAR NEUROPLASTY AT THE ELBOW;  Surgeon: Jodi Marble, MD;  Location: [facility];  Service: Orthopedics;  Laterality: Right;   ULNAR NERVE TRANSPOSITION Left 12/08/2016   Procedure: LEFT ULNAR NEUROPLASTY AT THE ELBOW;  Surgeon: Mack Hook, MD;  Location: [facility];  Service: Orthopedics;  Laterality: Left;   UPPER GASTROINTESTINAL ENDOSCOPY     VAGINAL HYSTERECTOMY  2006   w/  ANTERIOR AND POSTERIOR REPAIR WITH SLING SUSPENSION    Current Medications: Current Meds  Medication Sig   acetaminophen (TYLENOL) 500 MG tablet Take 1,000 mg by mouth every 6 (six) hours as needed for moderate pain.   albuterol (PROVENTIL HFA;VENTOLIN HFA) 108 (90 Base) MCG/ACT inhaler Inhale 2 puffs into the lungs every 6 (six) hours as needed for wheezing or shortness of breath.   atorvastatin (LIPITOR) 20 MG tablet Take 20 mg by mouth daily.   budesonide-formoterol (SYMBICORT) 160-4.5 MCG/ACT inhaler Inhale 1 puff into the lungs daily.   cyclobenzaprine (FLEXERIL) 10 MG tablet Take 1 tablet (10 mg total) by mouth 3 (three) times daily as needed for muscle spasms.   FLUoxetine (PROZAC) 40 MG capsule Take 40 mg by mouth daily.   fluticasone (FLONASE) 50 MCG/ACT nasal spray Place 2 sprays into both nostrils daily.   HYDROcodone-acetaminophen (NORCO) 10-325 MG tablet Take 1 tablet by mouth 2 (two) times daily as needed for moderate pain.   levothyroxine (SYNTHROID) 75 MCG tablet Take 75 mcg by mouth daily before breakfast.   loratadine (CLARITIN) 10 MG tablet Take 10 mg by mouth daily as needed for allergies  or rhinitis.   morphine (MS CONTIN) 15 MG 12 hr tablet Take 15 mg by mouth 3 (three) times daily.   Multiple Vitamins-Minerals (ONE A DAY WOMEN 50 PLUS PO) Take 1 tablet by mouth daily.   OZEMPIC, 2 MG/DOSE, 8 MG/3ML SOPN Inject 2 mg into the skin every Tuesday.   pantoprazole (PROTONIX) 40 MG tablet Take 40 mg by mouth 2 (two) times daily.   pregabalin (LYRICA) 200 MG capsule Take 200 mg by mouth 3 (three) times daily.   QUEtiapine (SEROQUEL) 100 MG tablet Take 100 mg at bedtime by mouth.   rOPINIRole (REQUIP) 0.25 MG tablet Take 0.25 mg by mouth at bedtime.    tiotropium (SPIRIVA) 18 MCG inhalation capsule Place 18 mcg into inhaler and inhale daily.   VASCEPA 1 g capsule  Take 1 g by mouth 2 (two) times daily.   zolpidem (AMBIEN) 10 MG tablet Take 5 mg by mouth at bedtime.     Allergies:   Adhesive [tape], Latex, Dilaudid [hydromorphone], and Oxycodone   Social History   Socioeconomic History   Marital status: Divorced    Spouse name: Not on file   Number of children: Not on file   Years of education: Not on file   Highest education level: Not on file  Occupational History   Not on file  Tobacco Use   Smoking status: Former    Packs/day: 1.50    Years: 30.00    Total pack years: 45.00    Types: Cigarettes    Quit date: 12/01/2007    Years since quitting: 14.0   Smokeless tobacco: Never  Vaping Use   Vaping Use: Never used  Substance and Sexual Activity   Alcohol use: Not Currently   Drug use: Never   Sexual activity: Yes    Birth control/protection: Surgical  Other Topics Concern   Not on file  Social History Narrative   Not on file   Social Determinants of Health   Financial Resource Strain: Not on file  Food Insecurity: Not on file  Transportation Needs: Not on file  Physical Activity: Not on file  Stress: Not on file  Social Connections: Not on file     Family History: The patient's family history includes Diabetes in her mother; Heart disease in her  mother; Hypertension in her mother; Mental illness in her mother. ROS:   Please see the history of present illness.    All 14 point review of systems negative except as described per history of present illness.  EKGs/Labs/Other Studies Reviewed:    The following studies were reviewed today: Echocardiogram done in the hospital in August 2023 showed no segmental wall motion abnormality, normal left ventricle ejection fraction trivial MR trivial TR overall good results  EKG:  EKG is  ordered today.  The ekg ordered today demonstrates normal sinus rhythm left atrium enlargement, right axis deviation, nonspecific ST segment changes  Recent Labs: No results found for requested labs within last 365 days.  Recent Lipid Panel No results found for: "CHOL", "TRIG", "HDL", "CHOLHDL", "VLDL", "LDLCALC", "LDLDIRECT"  Physical Exam:    VS:  BP 100/62 (BP Location: Left Arm, Patient Position: Sitting)   Pulse 95   Ht 5\' 6"  (1.676 m)   Wt 203 lb 12.8 oz (92.4 kg)   SpO2 98%   BMI 32.89 kg/m     Wt Readings from Last 3 Encounters:  12/13/21 203 lb 12.8 oz (92.4 kg)  11/18/21 198 lb (89.8 kg)  08/03/19 240 lb (108.9 kg)     GEN:  Well nourished, well developed in no acute distress HEENT: Normal NECK: No JVD; No carotid bruits LYMPHATICS: No lymphadenopathy CARDIAC: RRR, no murmurs, no rubs, no gallops RESPIRATORY:  Clear to auscultation without rales, wheezing or rhonchi  ABDOMEN: Soft, non-tender, non-distended MUSCULOSKELETAL:  No edema; No deformity  SKIN: Warm and dry NEUROLOGIC:  Alert and oriented x 3 PSYCHIATRIC:  Normal affect   ASSESSMENT:    1. Coronary artery calcification   2. Pre-op evaluation   3. Elevated coronary artery calcium score   4. Preop cardiovascular exam   5. Diabetes mellitus type 2 in obese (HCC)   6. Obstructive sleep apnea    PLAN:    In order of problems listed above:  Cardiovascular evaluation in this lady for elective hip  replacement surgery.   She does have multiple risk factors of coronary artery disease and already present coronary calcification noted on chest CT.  Because of incoming surgery and the fact that she cannot exercise well we need to have objective assessment of her coronary arteries which I think will be performed best with stress testing.  She was scheduled to have a Lexiscan. Elevated calcium score.  We will of course we will try to rule out obstructive disease if the evaluation will be negative then we need to consider doing on our efforts on modification of her risk factors.  I asked her to start taking baby aspirin however I want her to do it after surgery, I will also contact primary care physician to get her fasting lipid profile.  I would like to see her LDL less than 70. Diabetes mellitus has been followed by internal medicine team.  Apparently stable. Obstructive sleep apnea followed by ENT medicine team   Medication Adjustments/Labs and Tests Ordered: Current medicines are reviewed at length with the patient today.  Concerns regarding medicines are outlined above.  Orders Placed This Encounter  Procedures   MYOCARDIAL PERFUSION IMAGING   EKG 12-Lead   No orders of the defined types were placed in this encounter.   Signed, Georgeanna Lea, MD, [facility]. 12/13/2021 2:44 PM    [facility]

## 2021-12-17 ENCOUNTER — Ambulatory Visit: Payer: Medicaid Other | Attending: Cardiology

## 2021-12-17 DIAGNOSIS — I2510 Atherosclerotic heart disease of native coronary artery without angina pectoris: Secondary | ICD-10-CM | POA: Diagnosis not present

## 2021-12-17 DIAGNOSIS — I2584 Coronary atherosclerosis due to calcified coronary lesion: Secondary | ICD-10-CM

## 2021-12-17 LAB — MYOCARDIAL PERFUSION IMAGING
LV dias vol: 91 mL (ref 46–106)
LV sys vol: 44 mL
Nuc Stress EF: 52 %
Peak HR: 96 {beats}/min
Rest HR: 71 {beats}/min
Rest Nuclear Isotope Dose: 10.1 mCi
SDS: 5
SRS: 1
SSS: 6
Stress Nuclear Isotope Dose: 30.4 mCi
TID: 1.29

## 2021-12-17 MED ORDER — TECHNETIUM TC 99M TETROFOSMIN IV KIT
30.4000 | PACK | Freq: Once | INTRAVENOUS | Status: AC | PRN
  Administered 2021-12-17: 30.4 via INTRAVENOUS

## 2021-12-17 MED ORDER — TECHNETIUM TC 99M TETROFOSMIN IV KIT
10.1000 | PACK | Freq: Once | INTRAVENOUS | Status: AC | PRN
  Administered 2021-12-17: 10.1 via INTRAVENOUS

## 2021-12-17 MED ORDER — REGADENOSON 0.4 MG/5ML IV SOLN
0.4000 mg | Freq: Once | INTRAVENOUS | Status: AC
  Administered 2021-12-17: 0.4 mg via INTRAVENOUS

## 2021-12-17 NOTE — Progress Notes (Signed)
COVID Vaccine received:  _0  No _1  Yes Date of any COVID positive Test in last 90 days:  PCP - Rhea Bleacher, NP Cardiologist - Jenne Campus, MD Cardiac clearance 12/13/2021- ? Sent for Lexiscan/ EKG  Pain Medicine- Marjean Donna, [facility]  [facility]  [address]  [place], [place] [address]  [phone]   Neurology- Creig Hines, MD  [facility] - [facility]  [address]  [place]  [place], [place] [address]  [phone]   Chest x-ray -  EKG - 09-20-2021   Epic Stress Test - 12-17-21  Abnormal   Epic ECHO - 09-21-2021 Cardiac Cath -   PCR screen: _2  Ordered & Completed                      _3   No Order but Needs PROFEND                      _4   N/A for this surgery  Surgery Plan:  _5  Ambulatory                            _6  Outpatient in bed                            _7  Admit  Anesthesia:    _8  General  _9  Spinal                           _10   Choice _11   MAC   Pacemaker / ICD device _12  No _13  Yes        Device order form faxed _14  No    _15   Yes      Faxed to:  Spinal Cord Stimulator:_16  No _17  Yes      (Remind patient to bring remote DOS) Other Implants:   History of Sleep Apnea? _18  No _19  Yes   CPAP used?- _20  No _21  Yes    Does the patient monitor blood sugar? _22  No _23  Yes  _24  N/A Does patient have a [employer] or Dexacom? _25  No _26  Yes   Fasting Blood Sugar Ranges-  Checks Blood Sugar _____ times a day  Last dose of GLP1 agonist-  GLP1 instructions:  Last dose of SGLT-2 inhibitors-  SGLT-2 instructions:   Blood Thinner / Instructions:  none Aspirin Instructions:  ERAS Protocol Ordered: _27  No  _28  Yes PRE-SURGERY _29  ENSURE  _30  G2  _31  No Drink Ordered  Patient is to be NPO after: 07:00 am  DOX  Comments:   Activity level: Patient can / can not climb a flight of stairs without difficulty; _32  No CP  _33  No SOB, but would have ______   Patient can / can not perform ADLs without assistance.    Anesthesia review: HTN, COPD, DM, Abnormal CTA coronary- 3 vessel calcification, Abnormal Lexiscan on 12-17-2021  Patient denies shortness of breath, fever, cough and chest pain at PAT appointment.  Patient verbalized understanding and agreement to the Pre-Surgical Instructions that were given to them at this PAT appointment. Patient was also educated of the need to review these PAT instructions again prior to his/her surgery.I reviewed the appropriate phone numbers to call if they have any and questions or concerns.

## 2021-12-17 NOTE — Patient Instructions (Addendum)
SURGICAL WAITING ROOM VISITATION Patients having surgery or a procedure may have no more than 2 support people in the waiting area - these visitors may rotate in the visitor waiting room.   Children under the age of 29 must have an adult with them who is not the patient. If the patient needs to stay at the hospital during part of their recovery, the visitor guidelines for inpatient rooms apply.  PRE-OP VISITATION  Pre-op nurse will coordinate an appropriate time for 1 support person to accompany the patient in pre-op.  This support person may not rotate.  This visitor will be contacted when the time is appropriate for the visitor to come back in the pre-op area.  Please refer to the [facility] website for the visitor guidelines for Inpatients (after your surgery is over and you are in a regular room).  You are not required to quarantine at this time prior to your surgery. However, you must do this: Hand Hygiene often Do NOT share personal items Notify your provider if you are in close contact with someone who has COVID or you develop fever 100.4 or greater, new onset of sneezing, cough, sore throat, shortness of breath or body aches.  If you test positive for Covid or have been in contact with anyone that has tested positive in the last 10 days please notify you surgeon.    Your procedure is scheduled on:  Monday  December 23, 2021  Report to [facility] Main Entrance: [place] entrance where the [employer] is available.   Report to admitting at:  07:45    AM  +++++Call this number if you have any questions or problems the morning of surgery [phone]  Do not eat food after Midnight the night prior to your surgery/procedure.  After Midnight you may have the following liquids until  07:15  AM DAY OF SURGERY  Clear Liquid Diet Water Black Coffee (sugar ok, NO MILK/CREAM OR CREAMERS)  Tea (sugar ok, NO MILK/CREAM OR CREAMERS) regular and decaf                              Plain Jell-O  with no fruit (NO RED)                                           Fruit ices (not with fruit pulp, NO RED)                                     Popsicles (NO RED)                                                                  Juice: apple, WHITE grape, WHITE cranberry Sports drinks like Gatorade or Powerade (NO RED)                    The day of surgery:  Drink ONE (1) Pre-Surgery G2 at  07:15 AM the morning of surgery. Drink in one sitting. Do not sip.  This drink was  given to you during your hospital pre-op appointment visit. Nothing else to drink after completing the Pre-Surgery G2 : No candy, chewing gum or throat lozenges.    FOLLOW ANY ADDITIONAL PRE OP INSTRUCTIONS YOU RECEIVED FROM YOUR SURGEON'S OFFICE!!!   Oral Hygiene is also important to reduce your risk of infection.        Remember - BRUSH YOUR TEETH THE MORNING OF SURGERY WITH YOUR REGULAR TOOTHPASTE  Do NOT smoke after Midnight the night before surgery.  Take ONLY these medicines the morning of surgery with A SIP OF WATER: levothyroxine (Synthroid), pantoprazole (Protonix) If needed you may use your inhalers                   You may not have any metal on your body including hair pins, jewelry, and body piercing  Do not wear make-up, lotions, powders, perfumes or deodorant  Do not wear nail polish including gel and S&S, artificial / acrylic nails, or any other type of covering on natural nails including finger and toenails. If you have artificial nails, gel coating, etc., that needs to be removed by a nail salon, Please have this removed prior to surgery. Not doing so may mean that your surgery could be cancelled or delayed if the Surgeon or anesthesia staff feels like they are unable to monitor you safely.   Do not shave 48 hours prior to surgery to avoid nicks in your skin which may contribute to postoperative infections.   Contacts, Hearing Aids, dentures or bridgework may not be worn into  surgery.   Patients discharged on the day of surgery will not be allowed to drive home.  Someone NEEDS to stay with you for the first 24 hours after anesthesia.  Do not bring your home medications to the hospital. The Pharmacy will dispense medications listed on your medication list to you during your admission in the Hospital.  Please read over the following fact sheets you were given: IF YOU HAVE QUESTIONS ABOUT YOUR PRE-OP INSTRUCTIONS, PLEASE CALL [phone]  (KAY)   [place] - Preparing for Surgery Before surgery, you can play an important role.  Because skin is not sterile, your skin needs to be as free of germs as possible.  You can reduce the number of germs on your skin by washing with CHG (chlorahexidine gluconate) soap before surgery.  CHG is an antiseptic cleaner which kills germs and bonds with the skin to continue killing germs even after washing. Please DO NOT use if you have an allergy to CHG or antibacterial soaps.  If your skin becomes reddened/irritated stop using the CHG and inform your nurse when you arrive at Short Stay. Do not shave (including legs and underarms) for at least 48 hours prior to the first CHG shower.  You may shave your face/neck.  Please follow these instructions carefully:  1.  Shower with CHG Soap the night before surgery and the  morning of surgery.  2.  If you choose to wash your hair, wash your hair first as usual with your normal  shampoo.  3.  After you shampoo, rinse your hair and body thoroughly to remove the shampoo.                             4.  Use CHG as you would any other liquid soap.  You can apply chg directly to the skin and wash.  Gently with a scrungie or clean  washcloth.  5.  Apply the CHG Soap to your body ONLY FROM THE NECK DOWN.   Do not use on face/ open                           Wound or open sores. Avoid contact with eyes, ears mouth and genitals (private parts).                       Wash face,  Genitals (private parts)  with your normal soap.             6.  Wash thoroughly, paying special attention to the area where your  surgery  will be performed.  7.  Thoroughly rinse your body with warm water from the neck down.  8.  DO NOT shower/wash with your normal soap after using and rinsing off the CHG Soap.            9.  Pat yourself dry with a clean towel.            10.  Wear clean pajamas.            11.  Place clean sheets on your bed the night of your first shower and do not  sleep with pets.  ON THE DAY OF SURGERY : Do not apply any lotions/deodorants the morning of surgery.  Please wear clean clothes to the hospital/surgery center.    FAILURE TO FOLLOW THESE INSTRUCTIONS MAY RESULT IN THE CANCELLATION OF YOUR SURGERY  PATIENT SIGNATURE_________________________________  NURSE SIGNATURE__________________________________  ________________________________________________________________________        [name]    An incentive spirometer is a tool that can help keep your lungs clear and active. This tool measures how well you are filling your lungs with each breath. Taking long deep breaths may help reverse or decrease the chance of developing breathing (pulmonary) problems (especially infection) following: A long period of time when you are unable to move or be active. BEFORE THE PROCEDURE  If the spirometer includes an indicator to show your best effort, your nurse or respiratory therapist will set it to a desired goal. If possible, sit up straight or lean slightly forward. Try not to slouch. Hold the incentive spirometer in an upright position. INSTRUCTIONS FOR USE  Sit on the edge of your bed if possible, or sit up as far as you can in bed or on a chair. Hold the incentive spirometer in an upright position. Breathe out normally. Place the mouthpiece in your mouth and seal your lips tightly around it. Breathe in slowly and as deeply as possible, raising the piston or the ball  toward the top of the column. Hold your breath for 3-5 seconds or for as long as possible. Allow the piston or ball to fall to the bottom of the column. Remove the mouthpiece from your mouth and breathe out normally. Rest for a few seconds and repeat Steps 1 through 7 at least 10 times every 1-2 hours when you are awake. Take your time and take a few normal breaths between deep breaths. The spirometer may include an indicator to show your best effort. Use the indicator as a goal to work toward during each repetition. After each set of 10 deep breaths, practice coughing to be sure your lungs are clear. If you have an incision (the cut made at the time of surgery), support your incision when coughing by placing a pillow or  rolled up towels firmly against it. Once you are able to get out of bed, walk around indoors and cough well. You may stop using the incentive spirometer when instructed by your caregiver.  RISKS AND COMPLICATIONS Take your time so you do not get dizzy or light-headed. If you are in pain, you may need to take or ask for pain medication before doing incentive spirometry. It is harder to take a deep breath if you are having pain. AFTER USE Rest and breathe slowly and easily. It can be helpful to keep track of a log of your progress. Your caregiver can provide you with a simple table to help with this. If you are using the spirometer at home, follow these instructions: SEEK MEDICAL CARE IF:  You are having difficultly using the spirometer. You have trouble using the spirometer as often as instructed. Your pain medication is not giving enough relief while using the spirometer. You develop fever of 100.5 F (38.1 C) or higher.                                                                                                    SEEK IMMEDIATE MEDICAL CARE IF:  You cough up bloody sputum that had not been present before. You develop fever of 102 F (38.9 C) or greater. You develop  worsening pain at or near the incision site. MAKE SURE YOU:  Understand these instructions. Will watch your condition. Will get help right away if you are not doing well or get worse. Document Released: 05/26/2006 Document Revised: 04/07/2011 Document Reviewed: 07/27/2006 [facility] Patient Information 2014 [place], [place].[employer]

## 2021-12-18 ENCOUNTER — Encounter (HOSPITAL_COMMUNITY): Payer: Self-pay | Admitting: Physician Assistant

## 2021-12-18 ENCOUNTER — Ambulatory Visit (HOSPITAL_COMMUNITY)
Admission: RE | Admit: 2021-12-18 | Discharge: 2021-12-18 | Disposition: A | Discharge status: Home / Self Care | Payer: Medicaid Other | Source: Ambulatory Visit | Attending: Orthopedic Surgery | Admitting: Orthopedic Surgery

## 2021-12-18 ENCOUNTER — Telehealth: Payer: Self-pay | Admitting: Cardiology

## 2021-12-18 ENCOUNTER — Encounter (HOSPITAL_COMMUNITY): Payer: Self-pay

## 2021-12-18 ENCOUNTER — Other Ambulatory Visit: Payer: Self-pay

## 2021-12-18 ENCOUNTER — Encounter (HOSPITAL_COMMUNITY)
Admission: RE | Admit: 2021-12-18 | Discharge: 2021-12-18 | Disposition: A | Discharge status: Home / Self Care | Payer: Medicaid Other | Source: Ambulatory Visit | Attending: Orthopedic Surgery | Admitting: Orthopedic Surgery

## 2021-12-18 VITALS — BP 100/68 | HR 86 | Temp 98.6°F | Resp 20 | Ht 66.0 in | Wt 200.0 lb

## 2021-12-18 DIAGNOSIS — Z01818 Encounter for other preprocedural examination: Secondary | ICD-10-CM | POA: Insufficient documentation

## 2021-12-18 DIAGNOSIS — I10 Essential (primary) hypertension: Secondary | ICD-10-CM | POA: Diagnosis not present

## 2021-12-18 DIAGNOSIS — E119 Type 2 diabetes mellitus without complications: Secondary | ICD-10-CM | POA: Diagnosis not present

## 2021-12-18 LAB — GLUCOSE, CAPILLARY: Glucose-Capillary: 181 mg/dL — ABNORMAL HIGH (ref 70–99)

## 2021-12-18 LAB — HEMOGLOBIN A1C
Hgb A1c MFr Bld: 6.7 % — ABNORMAL HIGH (ref 4.8–5.6)
Mean Plasma Glucose: 145.59 mg/dL

## 2021-12-18 LAB — BASIC METABOLIC PANEL
Anion gap: 7 (ref 5–15)
BUN: 17 mg/dL (ref 6–20)
CO2: 28 mmol/L (ref 22–32)
Calcium: 9.8 mg/dL (ref 8.9–10.3)
Chloride: 106 mmol/L (ref 98–111)
Creatinine, Ser: 0.67 mg/dL (ref 0.44–1.00)
GFR, Estimated: >60 mL/min (ref >60)
Glucose, Bld: 180 mg/dL — ABNORMAL HIGH (ref 70–99)
Potassium: 4.5 mmol/L (ref 3.5–5.1)
Sodium: 141 mmol/L (ref 135–145)

## 2021-12-18 LAB — CBC
HCT: 43.9 % (ref 36.0–46.0)
Hemoglobin: 13.6 g/dL (ref 12.0–15.0)
MCH: 27.6 pg (ref 26.0–34.0)
MCHC: 31 g/dL (ref 30.0–36.0)
MCV: 89.2 fL (ref 80.0–100.0)
Platelets: 222 10*3/uL (ref 150–400)
RBC: 4.92 MIL/uL (ref 3.87–5.11)
RDW: 13.8 % (ref 11.5–15.5)
WBC: 7 10*3/uL (ref 4.0–10.5)
nRBC: 0 % (ref 0.0–0.2)

## 2021-12-18 NOTE — Telephone Encounter (Signed)
Pt is requesting call back in regards to results.  

## 2021-12-18 NOTE — Telephone Encounter (Signed)
Spoke with pt regarding stress test and Dr. Vanetta Shawl recommendation to come in to discuss results. Sent to front desk to schedule appt.

## 2021-12-23 ENCOUNTER — Ambulatory Visit (HOSPITAL_COMMUNITY): Admission: RE | Admit: 2021-12-23 | Payer: Medicaid Other | Source: Home / Self Care | Admitting: Orthopedic Surgery

## 2021-12-23 ENCOUNTER — Ambulatory Visit: Payer: Medicaid Other | Attending: Cardiology | Admitting: Cardiology

## 2021-12-23 ENCOUNTER — Encounter: Payer: Self-pay | Admitting: Cardiology

## 2021-12-23 ENCOUNTER — Encounter (HOSPITAL_COMMUNITY): Admission: RE | Payer: Self-pay | Source: Home / Self Care

## 2021-12-23 VITALS — BP 110/72 | HR 84 | Ht 66.0 in | Wt 205.0 lb

## 2021-12-23 DIAGNOSIS — I10 Essential (primary) hypertension: Secondary | ICD-10-CM | POA: Diagnosis not present

## 2021-12-23 DIAGNOSIS — Z01818 Encounter for other preprocedural examination: Secondary | ICD-10-CM

## 2021-12-23 DIAGNOSIS — R931 Abnormal findings on diagnostic imaging of heart and coronary circulation: Secondary | ICD-10-CM | POA: Diagnosis not present

## 2021-12-23 DIAGNOSIS — R9439 Abnormal result of other cardiovascular function study: Secondary | ICD-10-CM | POA: Insufficient documentation

## 2021-12-23 DIAGNOSIS — E1142 Type 2 diabetes mellitus with diabetic polyneuropathy: Secondary | ICD-10-CM | POA: Diagnosis not present

## 2021-12-23 DIAGNOSIS — E119 Type 2 diabetes mellitus without complications: Secondary | ICD-10-CM

## 2021-12-23 DIAGNOSIS — M797 Fibromyalgia: Secondary | ICD-10-CM

## 2021-12-23 HISTORY — DX: Abnormal result of other cardiovascular function study: R94.39

## 2021-12-23 LAB — TYPE AND SCREEN
ABO/RH(D): O NEG
Antibody Screen: NEGATIVE

## 2021-12-23 LAB — SURGICAL PCR SCREEN
MRSA, PCR: POSITIVE — AB
Staphylococcus aureus: POSITIVE — AB

## 2021-12-23 SURGERY — ARTHROPLASTY, HIP, TOTAL, ANTERIOR APPROACH
Anesthesia: Spinal | Site: Hip | Laterality: Right

## 2021-12-23 MED ORDER — ASPIRIN 81 MG PO TBEC: 81.0000 mg | DELAYED_RELEASE_TABLET | Freq: Every day | ORAL | 3 refills | Status: DC

## 2021-12-23 NOTE — Progress Notes (Signed)
PCR results sent to Dr. Graves to review. ?

## 2021-12-23 NOTE — Progress Notes (Unsigned)
Cardiology Office Note:    Date:  12/23/2021   ID:  [name], DOB [birth year], MRN [id number]  PCP:  Erskine Emery, NP  Cardiologist:  Gypsy Balsam, MD    Referring MD: Erskine Emery, NP   Chief Complaint  Patient presents with   Follow-up    History of Present Illness:    [name] is a 61 y.o. female with past medical history significant for remote smoking, COPD, essential hypertension, dyslipidemia, diabetes, fibromyalgia, she was referred to [place] because she was getting ready to have hip surgery, CT of the chest was done for different reason and she was found to have significant calcification of the coronary arteries.  After that stress test has been performed which showed ischemia in LAD territory.  Surgery for hip replacement has been canceled and she was referred back to [place] to talk about options.  She tells me she does not have any typical exertional chest pain tightness squeezing pressure burning chest but she does have diffuse pain including fibromyalgia described to have sharp pain in the chest.  Obviously her ability to exercise is limited because of hip problem.  Past Medical History:  Diagnosis Date   Allergic rhinitis    Anxiety    Asthma    Chronic lower back pain    Chronic pain syndrome    followed by pain clinic [facility] in [place]   COPD (chronic obstructive pulmonary disease) (HCC)    DDD (degenerative disc disease), cervical    DDD (degenerative disc disease), lumbar    Depression    Diabetic peripheral neuropathy (HCC)    hands and feet   Essential (primary) hypertension    Fibromyalgia    Full dentures    only wears upper   GERD without esophagitis    History of 2019 novel coronavirus disease (COVID-19)    08-10-2018  per pt in May 2020,  recovered at home without supplemental oxygen, never admitted to hospital,  residual nasal congestion and loss of taste is coming back , no cough sore throat sob or  difficultly breathing,  Negative covid test done 08-07-2018   History of pyelonephritis 09/2017   Hyperlipidemia    Hypothyroidism    IBS (irritable bowel syndrome)    no current med.   Left shoulder pain    Lumbar nerve root impingement    Lumbar post-laminectomy syndrome    Major depressive disorder    Obesity    OSA (obstructive sleep apnea)    08-10-2018  per pt has used in 8 months   Osteoarthritis    Polyneuropathy    PTSD (post-traumatic stress disorder)    Type II diabetes mellitus (HCC)    followed by pcp   Vitamin D deficiency    Wears glasses     Past Surgical History:  Procedure Laterality Date   BLADDER SUSPENSION  2006   CARPAL TUNNEL RELEASE Bilateral right 07/22/2001;  left 01-05-2003  dr Eulah Pont @[facility]    CHOLECYSTECTOMY     COLONOSCOPY W/ POLYPECTOMY     DILATION AND CURETTAGE OF UTERUS     LAPAROSCOPIC CHOLECYSTECTOMY  1992   LUMBAR LAMINECTOMY  1997   L4 --5   MULTIPLE EXTRACTIONS WITH ALVEOLOPLASTY N/A 11/26/2012   Procedure: MULTIPLE EXTRACTION WITH ALVEOLOPLASTY;  Surgeon: 11/28/2012, DDS;  Location: [facility] OR;  Service: Oral Surgery;  Laterality: N/A;   SHOULDER ARTHROSCOPY WITH ROTATOR CUFF REPAIR AND SUBACROMIAL DECOMPRESSION Left 08/11/2018   Procedure: SHOULDER ARTHROSCOPY WITH ROTATOR CUFF REPAIR AND SUBACROMIAL  DECOMPRESSION and DISTAL CLAVICLE ,DEBRIDEMENT ANTERIOR LABRIL TEAR;  Surgeon: Jodi GeraldsGraves, John, MD;  [facility];  Service: Orthopedics;  Laterality: Left;   TONSILLECTOMY  age 89   TOTAL HIP ARTHROPLASTY Left 07/10/2014   Procedure: TOTAL HIP ARTHROPLASTY ANTERIOR APPROACH;  Surgeon: Jodi GeraldsJohn Graves, MD;  Location: [facility] OR;  Service: Orthopedics;  Laterality: Left;   TRIGGER FINGER RELEASE Right 2018   thumb   TRIGGER FINGER RELEASE Left 10/22/2015   Procedure: LEFT TRIGGER THUMB RELEASE;  Surgeon: Mack Hookavid Thompson, MD;  Location: [facility];  Service: Orthopedics;  Laterality: Left;   TUBAL LIGATION  yrs ago    ULNAR NERVE TRANSPOSITION Right 03/27/2014   Procedure: RIGHT ULNAR NEUROPLASTY AT THE ELBOW;  Surgeon: Jodi Marbleavid A Thompson, MD;  Location: [facility];  Service: Orthopedics;  Laterality: Right;   ULNAR NERVE TRANSPOSITION Left 12/08/2016   Procedure: LEFT ULNAR NEUROPLASTY AT THE ELBOW;  Surgeon: Mack Hookhompson, David, MD;  Location: [facility];  Service: Orthopedics;  Laterality: Left;   UPPER GASTROINTESTINAL ENDOSCOPY     VAGINAL HYSTERECTOMY  2006   w/  ANTERIOR AND POSTERIOR REPAIR WITH SLING SUSPENSION    Current Medications: Current Meds  Medication Sig   acetaminophen (TYLENOL) 500 MG tablet Take 1,000 mg by mouth every 6 (six) hours as needed for moderate pain.   albuterol (PROVENTIL HFA;VENTOLIN HFA) 108 (90 Base) MCG/ACT inhaler Inhale 2 puffs into the lungs every 6 (six) hours as needed for wheezing or shortness of breath.   atorvastatin (LIPITOR) 20 MG tablet Take 20 mg by mouth daily.   budesonide-formoterol (SYMBICORT) 160-4.5 MCG/ACT inhaler Inhale 1 puff into the lungs daily.   cyclobenzaprine (FLEXERIL) 10 MG tablet Take 1 tablet (10 mg total) by mouth 3 (three) times daily as needed for muscle spasms.   FLUoxetine (PROZAC) 40 MG capsule Take 40 mg by mouth daily.   fluticasone (FLONASE) 50 MCG/ACT nasal spray Place 2 sprays into both nostrils daily.   HYDROcodone-acetaminophen (NORCO) 10-325 MG tablet Take 1 tablet by mouth 2 (two) times daily as needed for moderate pain.   levothyroxine (SYNTHROID) 75 MCG tablet Take 75 mcg by mouth daily before breakfast.   loratadine (CLARITIN) 10 MG tablet Take 10 mg by mouth daily as needed for allergies or rhinitis.   morphine (MS CONTIN) 15 MG 12 hr tablet Take 15 mg by mouth 3 (three) times daily.   Multiple Vitamins-Minerals (ONE A DAY WOMEN 50 PLUS PO) Take 1 tablet by mouth daily.   OZEMPIC, 2 MG/DOSE, 8 MG/3ML SOPN Inject 2 mg into the skin every Tuesday.   pantoprazole (PROTONIX) 40 MG tablet Take 40 mg  by mouth 2 (two) times daily.   pregabalin (LYRICA) 200 MG capsule Take 200 mg by mouth 3 (three) times daily.   QUEtiapine (SEROQUEL) 100 MG tablet Take 100 mg at bedtime by mouth.   rOPINIRole (REQUIP) 0.25 MG tablet Take 0.25 mg by mouth at bedtime.    tiotropium (SPIRIVA) 18 MCG inhalation capsule Place 18 mcg into inhaler and inhale daily.   VASCEPA 1 g capsule Take 1 g by mouth 2 (two) times daily.   zolpidem (AMBIEN) 10 MG tablet Take 5 mg by mouth at bedtime.     Allergies:   Adhesive [tape], Latex, Dilaudid [hydromorphone], and Oxycodone   Social History   Socioeconomic History   Marital status: Divorced    Spouse name: Not on file   Number of children: Not on file   Years  of education: Not on file   Highest education level: Not on file  Occupational History   Not on file  Tobacco Use   Smoking status: Former    Packs/day: 1.50    Years: 30.00    Total pack years: 45.00    Types: Cigarettes    Quit date: 12/01/2007    Years since quitting: 14.0   Smokeless tobacco: Never  Vaping Use   Vaping Use: Never used  Substance and Sexual Activity   Alcohol use: Not Currently   Drug use: Never   Sexual activity: Yes    Birth control/protection: Surgical  Other Topics Concern   Not on file  Social History Narrative   Not on file   Social Determinants of Health   Financial Resource Strain: Not on file  Food Insecurity: Not on file  Transportation Needs: Not on file  Physical Activity: Not on file  Stress: Not on file  Social Connections: Not on file     Family History: The patient's family history includes Diabetes in her mother; Heart disease in her mother; Hypertension in her mother; Mental illness in her mother. ROS:   Please see the history of present illness.    All 14 point review of systems negative except as described per history of present illness  EKGs/Labs/Other Studies Reviewed:      Recent Labs: 12/18/2021: BUN 17; Creatinine, Ser 0.67;  Hemoglobin 13.6; Platelets 222; Potassium 4.5; Sodium 141  Recent Lipid Panel No results found for: "CHOL", "TRIG", "HDL", "CHOLHDL", "VLDL", "LDLCALC", "LDLDIRECT"  Physical Exam:    VS:  BP 110/72 (BP Location: Right Arm, Patient Position: Sitting, Cuff Size: Normal)   Pulse 84   Ht 5\' 6"  (1.676 m)   Wt 205 lb (93 kg)   SpO2 92%   BMI 33.09 kg/m     Wt Readings from Last 3 Encounters:  12/23/21 205 lb (93 kg)  12/18/21 200 lb (90.7 kg)  12/17/21 203 lb (92.1 kg)     GEN:  Well nourished, well developed in no acute distress HEENT: Normal NECK: No JVD; No carotid bruits LYMPHATICS: No lymphadenopathy CARDIAC: RRR, no murmurs, no rubs, no gallops RESPIRATORY:  Clear to auscultation without rales, wheezing or rhonchi  ABDOMEN: Soft, non-tender, non-distended MUSCULOSKELETAL:  No edema; No deformity  SKIN: Warm and dry LOWER EXTREMITIES: no swelling NEUROLOGIC:  Alert and oriented x 3 PSYCHIATRIC:  Normal affect   ASSESSMENT:    1. Abnormal stress test anterior wall ischemia   2. Elevated coronary artery calcium score   3. Essential hypertension   4. Diabetic polyneuropathy associated with type 2 diabetes mellitus (HCC)   5. Fibromyalgia    PLAN:    In order of problems listed above:  Abnormal stress test with possibility of anterior wall ischemia.  She does have multiple risk factors for any heart problem.  We discussed option the situation probably best option is to pursue cardiac catheterization.  Procedure has been explained to her including all risk benefits as well as alternatives.  He is very familiar with the procedure, her mother had it done so she knows I will perform she is willing to proceed.  In the meantime I will start her on aspirin.  She is already on statin which I will continue. Elevated calcium score with abnormal stress test plan as described above. Essential hypertension blood pressure well-controlled continue present management. Dyslipidemia she is  on moderate intense statin for of Lipitor 20 which I will continue for now. We  had a long discussion about this situation.  Will need to clarify the status of her coronary arteries before proceeding with surgery.  She does not have any typical symptoms but she is not able to perform activity to the level that symptoms developed.  Therefore cardiac catheterization would be best option to clarify if she got any significant coronary artery disease.  Alternative to this would be coronary CT angio but with her degree of calcification we may have difficulty accessing it, therefore and the best ways just to go straight for cardiac catheterization   Medication Adjustments/Labs and Tests Ordered: Current medicines are reviewed at length with the patient today.  Concerns regarding medicines are outlined above.  No orders of the defined types were placed in this encounter.  Medication changes: No orders of the defined types were placed in this encounter.   Signed, Georgeanna Lea, MD, [facility] 12/23/2021 8:24 AM    [facility]

## 2021-12-23 NOTE — Patient Instructions (Addendum)
Medication Instructions:  Your physician has recommended you make the following change in your medication:   START: Aspirin 81 mg daily   *If you need a refill on your cardiac medications before your next appointment, please call your pharmacy*   Lab Work: Your physician recommends that you return for lab work in:   Labs today: CBC, BMP  If you have labs (blood work) drawn today and your tests are completely normal, you will receive your results only by: MyChart Message (if you have MyChart) OR A paper copy in the mail If you have any lab test that is abnormal or we need to change your treatment, we will call you to review the results.   Testing/Procedures:  [place] A DEPT OF [facility] A DEPT OF Eligha Bridegroom [facility] [address] [place] [address] Dept: [phone] Loc: [phone]  Kerrilyn Azbill  12/23/2021  You are scheduled for a Cardiac Catheterization on Thursday, November 30 with Dr. Verne Carrow.  1. Please arrive at the [facility] (Main Entrance A) at [facility]: [address] [place], [place] [address] at 8:30 AM (This time is two hours before your procedure to ensure your preparation). Free valet parking service is available.   Special note: Every effort is made to have your procedure done on time. Please understand that emergencies sometimes delay scheduled procedures.  2. Diet: Do not eat solid foods after midnight.  The patient may have clear liquids until 5am upon the day of the procedure.  3. Labs: You will need to have blood drawn on Monday, November 27 at [facility]: [address] [place], [place] . You do not need to be fasting.  4. Medication instructions in preparation for your procedure:   Contrast Allergy: No  On the morning of your procedure, take your Aspirin 81 mg and any morning medicines NOT listed above.  You may use sips of water.  5.  Plan for one night stay--bring personal belongings. 6. Bring a current list of your medications and current insurance cards. 7. You MUST have a responsible person to drive you home. 8. Someone MUST be with you the first 24 hours after you arrive home or your discharge will be delayed. 9. Please wear clothes that are easy to get on and off and wear slip-on shoes.  Thank you for allowing [place] to care for you!   -- [facility]    Follow-Up: At [facility], you and your health needs are our priority.  As part of our continuing mission to provide you with exceptional heart care, we have created designated Provider Care Teams.  These Care Teams include your primary Cardiologist (physician) and Advanced Practice Providers (APPs -  Physician Assistants and Nurse Practitioners) who all work together to provide you with the care you need, when you need it.  We recommend signing up for the patient portal called "MyChart".  Sign up information is provided on this After Visit Summary.  MyChart is used to connect with patients for Virtual Visits (Telemedicine).  Patients are able to view lab/test results, encounter notes, upcoming appointments, etc.  Non-urgent messages can be sent to your provider as well.   To learn more about what you can do with MyChart, go to [email or web address].    Your next appointment:   3 month(s)  The format for your next appointment:   In Person  Provider:   Molly Maduro  Bing Matter, MD    Other Instructions None  Important Information About Sugar

## 2021-12-24 LAB — BASIC METABOLIC PANEL
BUN/Creatinine Ratio: 25 — ABNORMAL HIGH (ref 9–23)
BUN: 18 mg/dL (ref 6–24)
CO2: 25 mmol/L (ref 20–29)
Calcium: 10.4 mg/dL — ABNORMAL HIGH (ref 8.7–10.2)
Chloride: 98 mmol/L (ref 96–106)
Creatinine, Ser: 0.72 mg/dL (ref 0.57–1.00)
Glucose: 144 mg/dL — ABNORMAL HIGH (ref 70–99)
Potassium: 4.3 mmol/L (ref 3.5–5.2)
Sodium: 143 mmol/L (ref 134–144)
eGFR: 96 mL/min/{1.73_m2} (ref >59)

## 2021-12-24 LAB — CBC
Hematocrit: 42.8 % (ref 34.0–46.6)
Hemoglobin: 14.1 g/dL (ref 11.1–15.9)
MCH: 28.5 pg (ref 26.6–33.0)
MCHC: 32.9 g/dL (ref 31.5–35.7)
MCV: 87 fL (ref 79–97)
Platelets: 225 10*3/uL (ref 150–450)
RBC: 4.94 x10E6/uL (ref 3.77–5.28)
RDW: 13.6 % (ref 11.7–15.4)
WBC: 5.5 10*3/uL (ref 3.4–10.8)

## 2021-12-25 ENCOUNTER — Telehealth: Payer: Self-pay | Admitting: *Deleted

## 2021-12-25 NOTE — Telephone Encounter (Signed)
Cardiac Catheterization scheduled at [place] for: Thursday December 26, 2021 10:30 AM Arrival time and place: [place] Main Entrance A at: 8:30 AM  Nothing to eat after midnight prior to procedure, clear liquids until 5 AM day of procedure.  Medication instructions: -Usual morning medications can be taken with sips of water including aspirin 81 mg.  Confirmed patient has responsible adult to drive home post procedure and be with patient first 24 hours after arriving home.  Patient reports no new symptoms concerning for COVID-19 in the past 10 days.   Reviewed procedure instructions with patient. 

## 2021-12-26 ENCOUNTER — Ambulatory Visit (HOSPITAL_COMMUNITY)
Admission: RE | Admit: 2021-12-26 | Discharge: 2021-12-26 | Disposition: A | Discharge status: Home / Self Care | Payer: Medicaid Other | Attending: Cardiovascular Disease | Admitting: Cardiovascular Disease

## 2021-12-26 ENCOUNTER — Telehealth: Payer: Self-pay | Admitting: *Deleted

## 2021-12-26 ENCOUNTER — Other Ambulatory Visit: Payer: Self-pay

## 2021-12-26 ENCOUNTER — Encounter (HOSPITAL_COMMUNITY)
Admission: RE | Disposition: A | Discharge status: Home / Self Care | Payer: Self-pay | Source: Home / Self Care | Attending: Cardiovascular Disease

## 2021-12-26 ENCOUNTER — Other Ambulatory Visit (HOSPITAL_COMMUNITY): Payer: Self-pay

## 2021-12-26 DIAGNOSIS — I2510 Atherosclerotic heart disease of native coronary artery without angina pectoris: Secondary | ICD-10-CM

## 2021-12-26 DIAGNOSIS — M797 Fibromyalgia: Secondary | ICD-10-CM | POA: Diagnosis not present

## 2021-12-26 DIAGNOSIS — Z955 Presence of coronary angioplasty implant and graft: Secondary | ICD-10-CM | POA: Insufficient documentation

## 2021-12-26 DIAGNOSIS — E785 Hyperlipidemia, unspecified: Secondary | ICD-10-CM | POA: Insufficient documentation

## 2021-12-26 DIAGNOSIS — I25110 Atherosclerotic heart disease of native coronary artery with unstable angina pectoris: Secondary | ICD-10-CM | POA: Insufficient documentation

## 2021-12-26 DIAGNOSIS — J449 Chronic obstructive pulmonary disease, unspecified: Secondary | ICD-10-CM | POA: Diagnosis not present

## 2021-12-26 DIAGNOSIS — Z7985 Long-term (current) use of injectable non-insulin antidiabetic drugs: Secondary | ICD-10-CM | POA: Insufficient documentation

## 2021-12-26 DIAGNOSIS — E669 Obesity, unspecified: Secondary | ICD-10-CM | POA: Insufficient documentation

## 2021-12-26 DIAGNOSIS — I10 Essential (primary) hypertension: Secondary | ICD-10-CM | POA: Diagnosis not present

## 2021-12-26 DIAGNOSIS — E119 Type 2 diabetes mellitus without complications: Secondary | ICD-10-CM | POA: Insufficient documentation

## 2021-12-26 DIAGNOSIS — G4733 Obstructive sleep apnea (adult) (pediatric): Secondary | ICD-10-CM | POA: Diagnosis not present

## 2021-12-26 DIAGNOSIS — Z6832 Body mass index (BMI) 32.0-32.9, adult: Secondary | ICD-10-CM | POA: Diagnosis not present

## 2021-12-26 DIAGNOSIS — I2584 Coronary atherosclerosis due to calcified coronary lesion: Secondary | ICD-10-CM | POA: Diagnosis not present

## 2021-12-26 DIAGNOSIS — Z87891 Personal history of nicotine dependence: Secondary | ICD-10-CM | POA: Diagnosis not present

## 2021-12-26 DIAGNOSIS — I200 Unstable angina: Secondary | ICD-10-CM

## 2021-12-26 HISTORY — PX: INTRAVASCULAR PRESSURE WIRE/FFR STUDY: CATH118243

## 2021-12-26 HISTORY — PX: LEFT HEART CATH AND CORONARY ANGIOGRAPHY: CATH118249

## 2021-12-26 HISTORY — DX: Unstable angina: I20.0

## 2021-12-26 HISTORY — DX: Atherosclerotic heart disease of native coronary artery without angina pectoris: I25.10

## 2021-12-26 HISTORY — PX: CORONARY STENT INTERVENTION: CATH118234

## 2021-12-26 LAB — GLUCOSE, CAPILLARY
Glucose-Capillary: 139 mg/dL — ABNORMAL HIGH (ref 70–99)
Glucose-Capillary: 125 mg/dL — ABNORMAL HIGH (ref 70–99)

## 2021-12-26 LAB — POCT ACTIVATED CLOTTING TIME
Activated Clotting Time: 293 seconds
Activated Clotting Time: 271 seconds

## 2021-12-26 SURGERY — LEFT HEART CATH AND CORONARY ANGIOGRAPHY
Anesthesia: LOCAL

## 2021-12-26 MED ORDER — CLOPIDOGREL BISULFATE 300 MG PO TABS
ORAL_TABLET | ORAL | Status: DC | PRN
  Administered 2021-12-26: 600 mg via ORAL

## 2021-12-26 MED ORDER — HYDRALAZINE HCL 20 MG/ML IJ SOLN: 10.0000 mg | INTRAMUSCULAR | Status: DC | PRN

## 2021-12-26 MED ORDER — FENTANYL CITRATE (PF) 100 MCG/2ML IJ SOLN
INTRAMUSCULAR | Status: DC | PRN
  Administered 2021-12-26: 50 ug via INTRAVENOUS
  Administered 2021-12-26 (×2): 25 ug via INTRAVENOUS

## 2021-12-26 MED ORDER — SODIUM CHLORIDE 0.9 % IV SOLN: 250.0000 mL | INTRAVENOUS | Status: DC | PRN

## 2021-12-26 MED ORDER — MIDAZOLAM HCL 2 MG/2ML IJ SOLN
INTRAMUSCULAR | Status: DC | PRN
  Administered 2021-12-26 (×2): 1 mg via INTRAVENOUS
  Administered 2021-12-26: 2 mg via INTRAVENOUS

## 2021-12-26 MED ORDER — ONDANSETRON HCL 4 MG/2ML IJ SOLN: 4.0000 mg | Freq: Four times a day (QID) | INTRAMUSCULAR | Status: DC | PRN

## 2021-12-26 MED ORDER — SODIUM CHLORIDE 0.9% FLUSH: 3.0000 mL | Freq: Two times a day (BID) | INTRAVENOUS | Status: DC

## 2021-12-26 MED ORDER — SODIUM CHLORIDE 0.9 % IV SOLN: INTRAVENOUS | Status: DC

## 2021-12-26 MED ORDER — ZOLPIDEM TARTRATE 5 MG PO TABS: 5.0000 mg | ORAL_TABLET | Freq: Every day | ORAL | Status: DC

## 2021-12-26 MED ORDER — CYCLOBENZAPRINE HCL 10 MG PO TABS: 10.0000 mg | ORAL_TABLET | Freq: Three times a day (TID) | ORAL | Status: DC | PRN

## 2021-12-26 MED ORDER — PANTOPRAZOLE SODIUM 40 MG PO TBEC: 40.0000 mg | DELAYED_RELEASE_TABLET | Freq: Two times a day (BID) | ORAL | Status: DC

## 2021-12-26 MED ORDER — ATORVASTATIN CALCIUM 20 MG PO TABS: 20.0000 mg | ORAL_TABLET | Freq: Every day | ORAL | Status: DC

## 2021-12-26 MED ORDER — LEVOTHYROXINE SODIUM 75 MCG PO TABS: 75.0000 ug | ORAL_TABLET | Freq: Every day | ORAL | Status: DC

## 2021-12-26 MED ORDER — TIOTROPIUM BROMIDE MONOHYDRATE 18 MCG IN CAPS: 18.0000 ug | ORAL_CAPSULE | Freq: Every day | RESPIRATORY_TRACT | Status: DC

## 2021-12-26 MED ORDER — SODIUM CHLORIDE 0.9% FLUSH: 3.0000 mL | INTRAVENOUS | Status: DC | PRN

## 2021-12-26 MED ORDER — FLUOXETINE HCL 40 MG PO CAPS: 40.0000 mg | ORAL_CAPSULE | Freq: Every day | ORAL | Status: DC

## 2021-12-26 MED ORDER — FLUTICASONE PROPIONATE 50 MCG/ACT NA SUSP: 2.0000 | Freq: Every day | NASAL | Status: DC

## 2021-12-26 MED ORDER — QUETIAPINE FUMARATE 100 MG PO TABS: 100.0000 mg | ORAL_TABLET | Freq: Every day | ORAL | Status: DC

## 2021-12-26 MED ORDER — VERAPAMIL HCL 2.5 MG/ML IV SOLN
INTRAVENOUS | Status: DC | PRN
  Administered 2021-12-26: 10 mL via INTRA_ARTERIAL

## 2021-12-26 MED ORDER — CLOPIDOGREL BISULFATE 300 MG PO TABS
ORAL_TABLET | ORAL | Status: AC
  Filled 2021-12-26: qty 2

## 2021-12-26 MED ORDER — FAMOTIDINE IN NACL 20-0.9 MG/50ML-% IV SOLN
INTRAVENOUS | Status: AC
  Filled 2021-12-26: qty 50

## 2021-12-26 MED ORDER — MIDAZOLAM HCL 2 MG/2ML IJ SOLN
INTRAMUSCULAR | Status: AC
  Filled 2021-12-26: qty 2

## 2021-12-26 MED ORDER — HEPARIN SODIUM (PORCINE) 1000 UNIT/ML IJ SOLN
INTRAMUSCULAR | Status: DC | PRN
  Administered 2021-12-26 (×2): 5000 [IU] via INTRAVENOUS

## 2021-12-26 MED ORDER — CLOPIDOGREL BISULFATE 75 MG PO TABS
75.0000 mg | ORAL_TABLET | Freq: Every day | ORAL | 5 refills | Status: DC
  Filled 2021-12-26: qty 30, 30d supply, fill #0

## 2021-12-26 MED ORDER — LABETALOL HCL 5 MG/ML IV SOLN: 10.0000 mg | INTRAVENOUS | Status: DC | PRN

## 2021-12-26 MED ORDER — ROPINIROLE HCL 0.25 MG PO TABS: 0.2500 mg | ORAL_TABLET | Freq: Every day | ORAL | Status: DC

## 2021-12-26 MED ORDER — VERAPAMIL HCL 2.5 MG/ML IV SOLN
INTRAVENOUS | Status: AC
  Filled 2021-12-26: qty 2

## 2021-12-26 MED ORDER — LIDOCAINE HCL (PF) 1 % IJ SOLN
INTRAMUSCULAR | Status: AC
  Filled 2021-12-26: qty 30

## 2021-12-26 MED ORDER — IOHEXOL 350 MG/ML SOLN
INTRAVENOUS | Status: DC | PRN
  Administered 2021-12-26: 165 mL

## 2021-12-26 MED ORDER — MORPHINE SULFATE ER 15 MG PO TBCR: 15.0000 mg | EXTENDED_RELEASE_TABLET | Freq: Three times a day (TID) | ORAL | Status: DC

## 2021-12-26 MED ORDER — HEPARIN SODIUM (PORCINE) 1000 UNIT/ML IJ SOLN
INTRAMUSCULAR | Status: AC
  Filled 2021-12-26: qty 10

## 2021-12-26 MED ORDER — HEPARIN (PORCINE) IN NACL 1000-0.9 UT/500ML-% IV SOLN
INTRAVENOUS | Status: DC | PRN
  Administered 2021-12-26 (×2): 500 mL

## 2021-12-26 MED ORDER — ASPIRIN 81 MG PO TBEC: 81.0000 mg | DELAYED_RELEASE_TABLET | Freq: Every day | ORAL | Status: DC

## 2021-12-26 MED ORDER — HEPARIN (PORCINE) IN NACL 1000-0.9 UT/500ML-% IV SOLN
INTRAVENOUS | Status: AC
  Filled 2021-12-26: qty 1000

## 2021-12-26 MED ORDER — FENTANYL CITRATE (PF) 100 MCG/2ML IJ SOLN
INTRAMUSCULAR | Status: AC
  Filled 2021-12-26: qty 2

## 2021-12-26 MED ORDER — CLOPIDOGREL BISULFATE 75 MG PO TABS: 75.0000 mg | ORAL_TABLET | Freq: Every day | ORAL | Status: DC

## 2021-12-26 MED ORDER — SODIUM CHLORIDE 0.9 % WEIGHT BASED INFUSION: 1.0000 mL/kg/h | INTRAVENOUS | Status: DC

## 2021-12-26 MED ORDER — LIDOCAINE HCL (PF) 1 % IJ SOLN
INTRAMUSCULAR | Status: DC | PRN
  Administered 2021-12-26: 2 mL

## 2021-12-26 MED ORDER — FAMOTIDINE IN NACL 20-0.9 MG/50ML-% IV SOLN
INTRAVENOUS | Status: AC | PRN
  Administered 2021-12-26: 20 mg via INTRAVENOUS

## 2021-12-26 MED ORDER — ACETAMINOPHEN 325 MG PO TABS: 650.0000 mg | ORAL_TABLET | ORAL | Status: DC | PRN

## 2021-12-26 MED ORDER — ATORVASTATIN CALCIUM 80 MG PO TABS
80.0000 mg | ORAL_TABLET | Freq: Every day | ORAL | 5 refills | Status: DC
  Filled 2021-12-26: qty 30, 30d supply, fill #0

## 2021-12-26 MED ORDER — ALBUTEROL SULFATE HFA 108 (90 BASE) MCG/ACT IN AERS: 2.0000 | INHALATION_SPRAY | Freq: Four times a day (QID) | RESPIRATORY_TRACT | Status: DC | PRN

## 2021-12-26 MED ORDER — PREGABALIN 100 MG PO CAPS: 200.0000 mg | ORAL_CAPSULE | Freq: Three times a day (TID) | ORAL | Status: DC

## 2021-12-26 MED ORDER — SODIUM CHLORIDE 0.9 % WEIGHT BASED INFUSION
3.0000 mL/kg/h | INTRAVENOUS | Status: AC
  Administered 2021-12-26: 3 mL/kg/h via INTRAVENOUS

## 2021-12-26 MED ORDER — ICOSAPENT ETHYL 1 G PO CAPS: 1.0000 g | ORAL_CAPSULE | Freq: Two times a day (BID) | ORAL | Status: DC

## 2021-12-26 MED ORDER — ASPIRIN 81 MG PO CHEW: 81.0000 mg | CHEWABLE_TABLET | ORAL | Status: DC

## 2021-12-26 MED ORDER — NITROGLYCERIN 0.4 MG SL SUBL
0.4000 mg | SUBLINGUAL_TABLET | SUBLINGUAL | 1 refills | Status: AC | PRN
  Filled 2021-12-26: qty 25, 7d supply, fill #0

## 2021-12-26 SURGICAL SUPPLY — 22 items
BALL SAPPHIRE NC24 2.50X15 (BALLOONS) ×1
BALLN EMERGE MR 2.0X12 (BALLOONS) ×1
BALLN SAPPHIRE 2.0X15 (BALLOONS) ×1
BALLOON EMERGE MR 2.0X12 (BALLOONS) IMPLANT
BALLOON SAPPHIRE 2.0X15 (BALLOONS) IMPLANT
BALLOON SAPPHIRE NC24 2.50X15 (BALLOONS) IMPLANT
CATH 5FR JL3.5 JR4 ANG PIG MP (CATHETERS) IMPLANT
CATH VISTA GUIDE 6FR XBLAD3.5 (CATHETERS) IMPLANT
DEVICE RAD COMP TR BAND LRG (VASCULAR PRODUCTS) IMPLANT
GLIDESHEATH SLEND SS 6F .021 (SHEATH) IMPLANT
GUIDEWIRE INQWIRE 1.5J.035X260 (WIRE) IMPLANT
GUIDEWIRE PRESSURE X 175 (WIRE) IMPLANT
INQWIRE 1.5J .035X260CM (WIRE) ×1
KIT ENCORE 26 ADVANTAGE (KITS) IMPLANT
KIT ESSENTIALS PG (KITS) IMPLANT
KIT HEART LEFT (KITS) ×1 IMPLANT
PACK CARDIAC CATHETERIZATION (CUSTOM PROCEDURE TRAY) ×1 IMPLANT
STENT SYNERGY XD 2.25X20 (Permanent Stent) IMPLANT
SYNERGY XD 2.25X20 (Permanent Stent) ×1 IMPLANT
TRANSDUCER W/STOPCOCK (MISCELLANEOUS) ×1 IMPLANT
TUBING CIL FLEX 10 FLL-RA (TUBING) ×1 IMPLANT
WIRE RUNTHROUGH IZANAI 014 180 (WIRE) IMPLANT

## 2021-12-26 NOTE — Discharge Summary (Signed)
Discharge Summary for Same Day PCI   Patient ID: [name] MRN: [id number]; DOB: 1960-05-12  Admit date: 12/26/2021 Discharge date: 12/26/2021  Primary Care Provider: Rhea Bleacher, NP  Primary Cardiologist: None  Primary Electrophysiologist:  None   Discharge Diagnoses    Active Problems:   Coronary artery disease involving native coronary artery of native heart with unstable angina pectoris [facility])   Unstable angina [facility])    Diagnostic Studies/Procedures    Cardiac Catheterization 12/26/2021:    Prox RCA to Dist RCA lesion is 20% stenosed.   Prox Cx to Mid Cx lesion is 30% stenosed.   2nd Mrg lesion is 30% stenosed.   Mid Cx to Dist Cx lesion is 40% stenosed.   Mid LAD lesion is 90% stenosed.   2nd Diag lesion is 60% stenosed.   A drug-eluting stent was successfully placed using a SYNERGY XD 2.25X20.   Balloon angioplasty was performed using a BALLN EMERGE MR 2.0X12.   Post intervention, there is a 50% residual stenosis.   Post intervention, there is a 0% residual stenosis.   Severe, calcified mid LAD stenosis. Moderate ostial diagonal stenosis arising just above the LAD lesion.  Successful PTCA/DES x 1 mid LAD Successful PTCA with balloon angioplasty ostium of the Diagonal branch Mild to moderate Circumflex/OM disease The RCA is heavily calcified throughout with minimal obstructive disease.  Normal LV filling pressures   Recommendations: Same day post PCI discharge. DAPT with ASA and Plavix for at least six months. Continue statin. Smoking cessation is advised.   Diagnostic Dominance: Right  Intervention    _____________   History of Present Illness     [name] is a 61 y.o. female with past medical history significant for remote smoking, COPD, essential hypertension, dyslipidemia, diabetes, fibromyalgia.   Patient with recent chest CT prior to hip surgery was notable for significant calcification of coronary arteries.  Patient saw Dr. Agustin Cree for pre-operative evaluation. At that time, she denied cardiac symptoms including chest pain or dyspnea. Exertion has been limited 2/2 orthopedic issues. A follow up stress test was completed and significant for LAD territory ischemia. Cardiac catheterization was arranged for further evaluation.  Hospital Course     The patient underwent cardiac cath as noted above with Dr. Angelena Form. Plan for DAPT with ASA/Plavix for at least 6 months. The patient was seen by cardiac rehab while in short stay. There were no observed complications post cath. Right radial cath site was re-evaluated prior to discharge and found to be stable without any complications. Instructions/precautions regarding cath site care were given prior to discharge.  [name] was seen by Dr. Angelena Form and determined stable for discharge home. Follow up with our office has been arranged. Medications are listed below. Pertinent changes include increasing Atorvastatin to 57m, initiation of Plavix 735m Will defer decision to start beta blocker therapy to Dr. KrAgustin Cree    _____________  Cath/PCI Registry Performance & Quality Measures: Aspirin prescribed? - Yes ADP Receptor Inhibitor (Plavix/Clopidogrel, Brilinta/Ticagrelor or Effient/Prasugrel) prescribed (includes medically managed patients)? - Yes High Intensity Statin (Lipitor 40-8022mr Crestor 20-10m68mrescribed? - Yes For EF <40%, was ACEI/ARB prescribed? - Not Applicable (EF >/= 40%)30%r EF <40%, Aldosterone Antagonist (Spironolactone or Eplerenone) prescribed? - Not Applicable (EF >/= 40%)07%rdiac Rehab Phase II ordered (Included Medically managed Patients)? - Yes  _____________   Discharge Vitals Blood pressure 121/67, pulse 75, temperature (!) 97.1 F (36.2 C), temperature source Temporal, resp. rate (!) 21, height _0  (1.676  m), weight 92.1 kg, SpO2 95 %.  Filed Weights   12/26/21 0836  Weight: 92.1 kg   Physical  Exam Vitals reviewed.  Constitutional:      Appearance: Normal appearance.  HENT:     Head: Normocephalic and atraumatic.     Nose: Nose normal.  Eyes:     Pupils: Pupils are equal, round, and reactive to light.  Cardiovascular:     Rate and Rhythm: Normal rate and regular rhythm.  Pulmonary:     Effort: Pulmonary effort is normal.     Breath sounds: Normal breath sounds.  Abdominal:     General: Bowel sounds are normal.  Musculoskeletal:        General: Normal range of motion.     Comments: Right radial artery access site with TR band in place. No active bleeding, swelling, hematoma.  Skin:    General: Skin is warm and dry.     Capillary Refill: Capillary refill takes less than 2 seconds.  Neurological:     General: No focal deficit present.     Mental Status: She is alert.  Psychiatric:        Mood and Affect: Mood normal.        Behavior: Behavior normal.        Thought Content: Thought content normal.        Judgment: Judgment normal.      Last Labs & Radiologic Studies    CBC No results for input(s): "WBC", "NEUTROABS", "HGB", "HCT", "MCV", "PLT" in the last 72 hours. Basic Metabolic Panel No results for input(s): "NA", "K", "CL", "CO2", "GLUCOSE", "BUN", "CREATININE", "CALCIUM", "MG", "PHOS" in the last 72 hours. Liver Function Tests No results for input(s): "AST", "ALT", "ALKPHOS", "BILITOT", "PROT", "ALBUMIN" in the last 72 hours. No results for input(s): "LIPASE", "AMYLASE" in the last 72 hours. High Sensitivity Troponin:   No results for input(s): "TROPONINIHS" in the last 720 hours.  BNP Invalid input(s): "POCBNP" D-Dimer No results for input(s): "DDIMER" in the last 72 hours. Hemoglobin A1C No results for input(s): "HGBA1C" in the last 72 hours. Fasting Lipid Panel No results for input(s): "CHOL", "HDL", "LDLCALC", "TRIG", "CHOLHDL", "LDLDIRECT" in the last 72 hours. Thyroid Function Tests No results for input(s): "TSH", "T4TOTAL", "T3FREE",  "THYROIDAB" in the last 72 hours.  Invalid input(s): "FREET3" _____________  CARDIAC CATHETERIZATION  Result Date: 12/26/2021   Prox RCA to Dist RCA lesion is 20% stenosed.   Prox Cx to Mid Cx lesion is 30% stenosed.   2nd Mrg lesion is 30% stenosed.   Mid Cx to Dist Cx lesion is 40% stenosed.   Mid LAD lesion is 90% stenosed.   2nd Diag lesion is 60% stenosed.   A drug-eluting stent was successfully placed using a SYNERGY XD 2.25X20.   Balloon angioplasty was performed using a BALLN EMERGE MR 2.0X12.   Post intervention, there is a 50% residual stenosis.   Post intervention, there is a 0% residual stenosis. Severe, calcified mid LAD stenosis. Moderate ostial diagonal stenosis arising just above the LAD lesion. Successful PTCA/DES x 1 mid LAD Successful PTCA with balloon angioplasty ostium of the Diagonal branch Mild to moderate Circumflex/OM disease The RCA is heavily calcified throughout with minimal obstructive disease. Normal LV filling pressures Recommendations: Same day post PCI discharge. DAPT with ASA and Plavix for at least six months. Continue statin. Smoking cessation is advised.   DG Chest 2 View  Result Date: 12/20/2021 CLINICAL DATA:  Preoperative exam EXAM: CHEST - 2 VIEW  COMPARISON:  Chest radiograph November 23, 2012 FINDINGS: Stable cardiac and mediastinal contours. No large area pulmonary consolidation. Minimal linear right lung base atelectasis. Stable right lung calcified granuloma. No pleural effusion or pneumothorax. Thoracic spine degenerative changes. IMPRESSION: No active cardiopulmonary disease. Electronically Signed   By: Lovey Newcomer M.D.   On: 12/20/2021 12:55   MYOCARDIAL PERFUSION IMAGING  Result Date: 12/17/2021   Findings are consistent with ischemia. The study is intermediate risk.   LV perfusion is abnormal. Defect 1: There is a medium defect with moderate reduction in uptake present in the anterior location(s) that is reversible.   Left ventricular function is  normal. Nuclear stress EF: 52 %. End diastolic cavity size is normal.   Prior study not available for comparison.    Disposition   Pt is being discharged home today in good condition.  Follow-up Plans & Appointments     Discharge Instructions     Amb Referral to Cardiac Rehabilitation   Complete by: As directed    Diagnosis: Coronary Stents   After initial evaluation and assessments completed: Virtual Based Care may be provided alone or in conjunction with Phase 2 Cardiac Rehab based on patient barriers.: Yes   Intensive Cardiac Rehabilitation (ICR) [place] location only OR Traditional Cardiac Rehabilitation (TCR) *If criteria for ICR are not met will enroll in TCR [facility] only): Yes        Discharge Medications   Allergies as of 12/26/2021       Reactions   Adhesive [tape] Hives   Latex Hives   Dilaudid [hydromorphone] Hives   Oxycodone Itching        Medication List     TAKE these medications    acetaminophen 500 MG tablet Commonly known as: TYLENOL Take 1,000 mg by mouth every 6 (six) hours as needed for moderate pain.   albuterol 108 (90 Base) MCG/ACT inhaler Commonly known as: VENTOLIN HFA Inhale 2 puffs into the lungs every 6 (six) hours as needed for wheezing or shortness of breath.   aspirin EC 81 MG tablet Take 1 tablet (81 mg total) by mouth daily. Swallow whole.   atorvastatin 80 MG tablet Commonly known as: LIPITOR Take 1 tablet (80 mg total) by mouth daily. What changed:  medication strength how much to take   budesonide-formoterol 160-4.5 MCG/ACT inhaler Commonly known as: SYMBICORT Inhale 1 puff into the lungs daily.   clopidogrel 75 MG tablet Commonly known as: PLAVIX Take 1 tablet (75 mg total) by mouth daily with breakfast. Start taking on: December 27, 2021   cyclobenzaprine 10 MG tablet Commonly known as: FLEXERIL Take 1 tablet (10 mg total) by mouth 3 (three) times daily as needed for muscle spasms.   FLUoxetine 40 MG  capsule Commonly known as: PROZAC Take 40 mg by mouth daily.   fluticasone 50 MCG/ACT nasal spray Commonly known as: FLONASE Place 2 sprays into both nostrils daily.   HYDROcodone-acetaminophen 10-325 MG tablet Commonly known as: NORCO Take 1 tablet by mouth 2 (two) times daily as needed for moderate pain.   levothyroxine 75 MCG tablet Commonly known as: SYNTHROID Take 75 mcg by mouth daily before breakfast.   loratadine 10 MG tablet Commonly known as: CLARITIN Take 10 mg by mouth daily as needed for allergies or rhinitis.   morphine 15 MG 12 hr tablet Commonly known as: MS CONTIN Take 15 mg by mouth 3 (three) times daily.   nitroGLYCERIN 0.4 MG SL tablet Commonly known as: Nitrostat Place 1 tablet (0.4 mg  total) under the tongue every 5 (five) minutes as needed for chest pain.   ONE A DAY WOMEN 50 PLUS PO Take 1 tablet by mouth daily.   Ozempic (2 MG/DOSE) 8 MG/3ML Sopn Generic drug: Semaglutide (2 MG/DOSE) Inject 2 mg into the skin every Tuesday.   pantoprazole 40 MG tablet Commonly known as: PROTONIX Take 40 mg by mouth 2 (two) times daily.   pregabalin 200 MG capsule Commonly known as: LYRICA Take 200 mg by mouth 3 (three) times daily.   QUEtiapine 100 MG tablet Commonly known as: SEROQUEL Take 100 mg at bedtime by mouth.   rOPINIRole 0.25 MG tablet Commonly known as: REQUIP Take 0.25 mg by mouth at bedtime.   tiotropium 18 MCG inhalation capsule Commonly known as: SPIRIVA Place 18 mcg into inhaler and inhale daily.   Vascepa 1 g capsule Generic drug: icosapent Ethyl Take 1 g by mouth 2 (two) times daily.   zolpidem 10 MG tablet Commonly known as: AMBIEN Take 5 mg by mouth at bedtime.           Allergies Allergies  Allergen Reactions   Adhesive [Tape] Hives   Latex Hives   Dilaudid [Hydromorphone] Hives   Oxycodone Itching    Outstanding Labs/Studies     Duration of Discharge Encounter   Greater than 30 minutes including physician  time.  Delton Coombes, PA-C 12/26/2021, 4:03 PM

## 2021-12-26 NOTE — Progress Notes (Signed)
Pt ambulated to bathroom and had 3 sharp cp, no further sx, informed PA dunn, will get EKG, VSS

## 2021-12-26 NOTE — Progress Notes (Signed)
Seen pt from [phone] pt was educated on stent card, stent procedure, wt restriction, no baths/daily wash-ups, s/s of infection, Aspirin & Plavix use, ex guidelines (progressive cycling + resistance exercise), s/s to stop exercising, NTG use and calling 911, heart healthy and diabetic diet, and CRPII. Pt is being referred to [facility] at [facility].   [name] 12/26/2021 1:38 PM

## 2021-12-26 NOTE — Telephone Encounter (Signed)
-----   Message from Perlie Gold, PA-C sent at 12/26/2021  3:59 PM EST ----- Regarding: TOC call This patient is scheduled for a post PCI TOC follow up with Thayer Ohm on 12/8 @ 2:20. Scheduled at [facility] due to limited [place] availability.   Thanks!  Perlie Gold PA-C

## 2021-12-26 NOTE — Telephone Encounter (Signed)
Reviewed the patient's chart. She was just discharged today.  Nursing will need to attempt 1st TOC call to the patient tomorrow.

## 2021-12-26 NOTE — Interval H&P Note (Signed)
History and Physical Interval Note:  12/26/2021 8:44 AM  [name]  has presented today for surgery, with the diagnosis of CAD.  The various methods of treatment have been discussed with the patient and family. After consideration of risks, benefits and other options for treatment, the patient has consented to  Procedure(s): LEFT HEART CATH AND CORONARY ANGIOGRAPHY (N/A) as a surgical intervention.  The patient's history has been reviewed, patient examined, no change in status, stable for surgery.  I have reviewed the patient's chart and labs.  Questions were answered to the patient's satisfaction.    Cath Lab Visit (complete for each Cath Lab visit)  Clinical Evaluation Leading to the Procedure:   ACS: No.  Non-ACS:    Anginal Classification: CCS III  Anti-ischemic medical therapy: No Therapy  Non-Invasive Test Results: Intermediate risk stress test  Prior CABG: No CABG        Verne Carrow

## 2021-12-27 ENCOUNTER — Encounter (HOSPITAL_COMMUNITY): Payer: Self-pay | Admitting: Cardiovascular Disease

## 2021-12-27 NOTE — Telephone Encounter (Signed)
Transition Care Management Follow-up Telephone Call Date of discharge and from where: Thursday 12/26/21 @ [facility] How have you been since you were released from the hospital? No complaints Any questions or concerns? No  Items Reviewed: Did the pt receive and understand the discharge instructions provided? Yes  Medications obtained and verified? Yes  Other? No  Any new allergies since your discharge? No  Dietary orders reviewed? No Do you have support at home? Yes   Home Care and Equipment/Supplies: Were home health services ordered? no If so, what is the name of the agency? N/a  Has the agency set up a time to come to the patient's home? not applicable Were any new equipment or medical supplies ordered?  No What is the name of the medical supply agency? N/a Were you able to get the supplies/equipment? not applicable Do you have any questions related to the use of the equipment or supplies? No  Functional Questionnaire: (I = Independent and D = Dependent) ADLs: I  Bathing/Dressing- I  Meal Prep- I  Eating- I  Maintaining continence- I  Transferring/Ambulation- I  Managing Meds- I  Follow up appointments reviewed:  PCP Hospital f/u appt confirmed? No Specialist Hospital f/u appt confirmed? Yes  Scheduled to see Nicolasa Ducking, NP on Friday 01/03/22 @ 2:20 pm. Are transportation arrangements needed? No  If their condition worsens, is the pt aware to call PCP or go to the Emergency Dept.? Yes Was the patient provided with contact information for the PCP's office or ED? No Was to pt encouraged to call back with questions or concerns? Yes

## 2021-12-31 ENCOUNTER — Telehealth: Payer: Self-pay | Admitting: Cardiology

## 2021-12-31 NOTE — Telephone Encounter (Signed)
  Pt would like for Dr. Kirtland Bouchard to know that she has a new PCP PA Abe People with [facility], she gave fax# [phone]. She said that where they need to send her records

## 2022-01-03 ENCOUNTER — Ambulatory Visit: Payer: Medicaid Other | Attending: Nurse Practitioner | Admitting: Nurse Practitioner

## 2022-01-03 ENCOUNTER — Encounter: Payer: Self-pay | Admitting: Nurse Practitioner

## 2022-01-03 VITALS — BP 100/76 | HR 85 | Ht 66.0 in | Wt 203.0 lb

## 2022-01-03 DIAGNOSIS — E1169 Type 2 diabetes mellitus with other specified complication: Secondary | ICD-10-CM | POA: Diagnosis not present

## 2022-01-03 DIAGNOSIS — I2510 Atherosclerotic heart disease of native coronary artery without angina pectoris: Secondary | ICD-10-CM | POA: Diagnosis not present

## 2022-01-03 DIAGNOSIS — E785 Hyperlipidemia, unspecified: Secondary | ICD-10-CM

## 2022-01-03 DIAGNOSIS — E669 Obesity, unspecified: Secondary | ICD-10-CM

## 2022-01-03 DIAGNOSIS — I10 Essential (primary) hypertension: Secondary | ICD-10-CM

## 2022-01-03 MED ORDER — CLOPIDOGREL BISULFATE 75 MG PO TABS: 75.0000 mg | ORAL_TABLET | Freq: Every day | ORAL | 3 refills | Status: DC

## 2022-01-03 NOTE — Progress Notes (Signed)
Office Visit    Patient Name: [name] Date of Encounter: 01/03/2022  Primary Care Provider:  Ellis Parents, FNP Primary Cardiologist:  Gypsy Balsam, MD  Chief Complaint    61 year old female with a history of remote tobacco abuse, COPD, hypertension, hyperlipidemia, diabetes, and fibromyalgia, presents for follow-up after recent abnormal stress test, cath, and PCI of the LAD and second diagonal.  Past Medical History    Past Medical History:  Diagnosis Date   Allergic rhinitis    Anxiety    Asthma    CAD (coronary artery disease)    a. 11/2021 MV: Ant ischemia. EF 52%; b. 11/2021 PCI: LAD 14m (2.25x20 Synergy XD DES), D2 60 (PTCA), LCX 30p/m, 48m/d, OM2 30, RCA large, 20p/d.   Chronic lower back pain    Chronic pain syndrome    followed by pain clinic [facility] in [place]   COPD (chronic obstructive pulmonary disease) (HCC)    DDD (degenerative disc disease), cervical    DDD (degenerative disc disease), lumbar    Depression    Diabetic peripheral neuropathy (HCC)    hands and feet   Essential (primary) hypertension    Fibromyalgia    Full dentures    only wears upper   GERD without esophagitis    History of 2019 novel coronavirus disease (COVID-19)    08-10-2018  per pt in May 2020,  recovered at home without supplemental oxygen, never admitted to hospital,  residual nasal congestion and loss of taste is coming back , no cough sore throat sob or difficultly breathing,  Negative covid test done 08-07-2018   History of pyelonephritis 09/2017   Hyperlipidemia    Hypothyroidism    IBS (irritable bowel syndrome)    no current med.   Left shoulder pain    Lumbar nerve root impingement    Lumbar post-laminectomy syndrome    Major depressive disorder    Obesity    OSA (obstructive sleep apnea)    08-10-2018  per pt has used in 8 months   Osteoarthritis    Polyneuropathy    PTSD (post-traumatic stress disorder)    Type II diabetes  mellitus (HCC)    followed by pcp   Vitamin D deficiency    Wears glasses    Past Surgical History:  Procedure Laterality Date   BLADDER SUSPENSION  2006   CARPAL TUNNEL RELEASE Bilateral right 07/22/2001;  left 01-05-2003  dr Eulah Pont @[facility]    CHOLECYSTECTOMY     COLONOSCOPY W/ POLYPECTOMY     CORONARY STENT INTERVENTION N/A 12/26/2021   Procedure: CORONARY STENT INTERVENTION;  Surgeon: 12/28/2021, MD;  Location: [facility];  Service: Cardiovascular;  Laterality: N/A;   DILATION AND CURETTAGE OF UTERUS     INTRAVASCULAR PRESSURE WIRE/FFR STUDY N/A 12/26/2021   Procedure: INTRAVASCULAR PRESSURE WIRE/FFR STUDY;  Surgeon: 12/28/2021, MD;  Location: [facility];  Service: Cardiovascular;  Laterality: N/A;   LAPAROSCOPIC CHOLECYSTECTOMY  1992   LEFT HEART CATH AND CORONARY ANGIOGRAPHY N/A 12/26/2021   Procedure: LEFT HEART CATH AND CORONARY ANGIOGRAPHY;  Surgeon: 12/28/2021, MD;  Location: [facility];  Service: Cardiovascular;  Laterality: N/A;   LUMBAR LAMINECTOMY  1997   L4 --5   MULTIPLE EXTRACTIONS WITH ALVEOLOPLASTY N/A 11/26/2012   Procedure: MULTIPLE EXTRACTION WITH ALVEOLOPLASTY;  Surgeon: 11/28/2012, DDS;  Location: [facility];  Service: Oral Surgery;  Laterality: N/A;   SHOULDER ARTHROSCOPY WITH ROTATOR CUFF REPAIR AND SUBACROMIAL DECOMPRESSION  Left 08/11/2018   Procedure: SHOULDER ARTHROSCOPY WITH ROTATOR CUFF REPAIR AND SUBACROMIAL DECOMPRESSION and DISTAL CLAVICLE ,DEBRIDEMENT ANTERIOR LABRIL TEAR;  Surgeon: Jodi Geralds, MD;  Location: [facility];  Service: Orthopedics;  Laterality: Left;   TONSILLECTOMY  age 82   TOTAL HIP ARTHROPLASTY Left 07/10/2014   Procedure: TOTAL HIP ARTHROPLASTY ANTERIOR APPROACH;  Surgeon: Jodi Geralds, MD;  Location: [facility];  Service: Orthopedics;  Laterality: Left;   TRIGGER FINGER RELEASE Right 2018   thumb   TRIGGER FINGER RELEASE Left 10/22/2015   Procedure: LEFT TRIGGER  THUMB RELEASE;  Surgeon: Mack Hook, MD;  Location: [facility];  Service: Orthopedics;  Laterality: Left;   TUBAL LIGATION  yrs ago   ULNAR NERVE TRANSPOSITION Right 03/27/2014   Procedure: RIGHT ULNAR NEUROPLASTY AT THE ELBOW;  Surgeon: Jodi Marble, MD;  Location: [facility];  Service: Orthopedics;  Laterality: Right;   ULNAR NERVE TRANSPOSITION Left 12/08/2016   Procedure: LEFT ULNAR NEUROPLASTY AT THE ELBOW;  Surgeon: Mack Hook, MD;  Location: [facility];  Service: Orthopedics;  Laterality: Left;   UPPER GASTROINTESTINAL ENDOSCOPY     VAGINAL HYSTERECTOMY  2006   w/  ANTERIOR AND POSTERIOR REPAIR WITH SLING SUSPENSION    Allergies  Allergies  Allergen Reactions   Adhesive [Tape] Hives   Latex Hives   Dilaudid [Hydromorphone] Hives   Oxycodone Itching    History of Present Illness    61 year old female with above past medical history including remote tobacco abuse, COPD, hypertension, hyperlipidemia, diabetes, and fibromyalgia.  She has a relatively long history of somewhat atypical and sharp and shooting chest pain for which she was evaluated several years ago with stress testing which was apparently normal.  More recently, in the setting of lung cancer screening, she had a CT of the chest which showed significant coronary calcifications.  She was recently referred to cardiology for preoperative evaluation pending hip surgery, given chest pain and coronary calcification history.  She subsequently underwent stress testing which was abnormal with anterior ischemia and an EF of 50%.  She underwent diagnostic catheterization in November which revealed a 90% mid LAD stenosis as well as a 6% second diagonal stenosis.  The LAD was successfully treated with drug-eluting stent, while the diagonal was treated with PTCA.  She otherwise had mild, nonobstructive disease throughout the circumflex, OM 2, and right coronary artery.  Since PCI,  she has felt relatively well.  She notes that ever since the day of the procedure, she has had a mild, intermittent pressure-like sensation over her left chest, which she says just comes and goes, typically at rest, lasts a few seconds or minutes, has no associated symptoms, and does not limit activity.  She has not had any dyspnea on exertion and has only had a few very brief episodes of sharp and fleeting chest pain, similar to what she had experienced prior to PCI.  She denies palpitations, PND, orthopnea, dizziness, syncope, edema, or early satiety.  She is interested in cardiac rehabilitation.  Home Medications    Current Outpatient Medications  Medication Sig Dispense Refill   acetaminophen (TYLENOL) 500 MG tablet Take 1,000 mg by mouth every 6 (six) hours as needed for moderate pain.     albuterol (PROVENTIL HFA;VENTOLIN HFA) 108 (90 Base) MCG/ACT inhaler Inhale 2 puffs into the lungs every 6 (six) hours as needed for wheezing or shortness of breath.     aspirin EC 81 MG tablet Take 1 tablet (81  mg total) by mouth daily. Swallow whole. 90 tablet 3   atorvastatin (LIPITOR) 80 MG tablet Take 1 tablet (80 mg total) by mouth daily. 30 tablet 5   budesonide-formoterol (SYMBICORT) 160-4.5 MCG/ACT inhaler Inhale 1 puff into the lungs daily.     cyclobenzaprine (FLEXERIL) 10 MG tablet Take 1 tablet (10 mg total) by mouth 3 (three) times daily as needed for muscle spasms. 30 tablet 0   FLUoxetine (PROZAC) 40 MG capsule Take 40 mg by mouth daily.     fluticasone (FLONASE) 50 MCG/ACT nasal spray Place 2 sprays into both nostrils daily.     HYDROcodone-acetaminophen (NORCO) 10-325 MG tablet Take 1 tablet by mouth 2 (two) times daily as needed for moderate pain.     levothyroxine (SYNTHROID) 75 MCG tablet Take 75 mcg by mouth daily before breakfast.     loratadine (CLARITIN) 10 MG tablet Take 10 mg by mouth daily as needed for allergies or rhinitis.     morphine (MS CONTIN) 15 MG 12 hr tablet Take 15 mg  by mouth 3 (three) times daily.     Multiple Vitamins-Minerals (ONE A DAY WOMEN 50 PLUS PO) Take 1 tablet by mouth daily.     nitroGLYCERIN (NITROSTAT) 0.4 MG SL tablet Place 1 tablet (0.4 mg total) under the tongue every 5 (five) minutes as needed for chest pain. 30 tablet 1   OZEMPIC, 2 MG/DOSE, 8 MG/3ML SOPN Inject 2 mg into the skin every Tuesday.     pantoprazole (PROTONIX) 40 MG tablet Take 40 mg by mouth 2 (two) times daily.     pregabalin (LYRICA) 200 MG capsule Take 200 mg by mouth 3 (three) times daily.     QUEtiapine (SEROQUEL) 100 MG tablet Take 100 mg at bedtime by mouth.     rOPINIRole (REQUIP) 0.25 MG tablet Take 0.25 mg by mouth at bedtime.   5   tiotropium (SPIRIVA) 18 MCG inhalation capsule Place 18 mcg into inhaler and inhale daily.     VASCEPA 1 g capsule Take 1 g by mouth 2 (two) times daily.     zolpidem (AMBIEN) 10 MG tablet Take 5 mg by mouth at bedtime.     clopidogrel (PLAVIX) 75 MG tablet Take 1 tablet (75 mg total) by mouth daily with breakfast. 90 tablet 3   No current facility-administered medications for this visit.     Review of Systems    Mild intermittent pressure-like sensation over her left chest ever since her PCI, which is typically fleeting and does not limit activity.  She denies dyspnea, palpitations, PND, orthopnea, dizziness, syncope, edema, or early satiety.  All other systems reviewed and are otherwise negative except as noted above.   Cardiac Rehabilitation Eligibility Assessment      Physical Exam    VS:  BP 100/76 (BP Location: Left Arm, Patient Position: Sitting, Cuff Size: Large)   Pulse 85   Ht 5\' 6"  (1.676 m)   Wt 203 lb (92.1 kg)   SpO2 98%   BMI 32.77 kg/m  , BMI Body mass index is 32.77 kg/m.     GEN: Well nourished, well developed, in no acute distress. HEENT: normal. Neck: Supple, no JVD, carotid bruits, or masses. Cardiac: RRR, no murmurs, rubs, or gallops. No clubbing, cyanosis, edema.  Radials 2+/PT 2+ and equal  bilaterally.  Right radial catheterization site without bleeding, bruit, or hematoma. Respiratory:  Respirations regular and unlabored, clear to auscultation bilaterally. GI: Soft, nontender, nondistended, BS + x 4. MS: no deformity or  atrophy. Skin: warm and dry, no rash. Neuro:  Strength and sensation are intact. Psych: Normal affect.  Accessory Clinical Findings    ECG personally reviewed by me today -regular sinus rhythm, 85- no acute changes.  Lab Results  Component Value Date   WBC 5.5 12/23/2021   HGB 14.1 12/23/2021   HCT 42.8 12/23/2021   MCV 87 12/23/2021   PLT 225 12/23/2021   Lab Results  Component Value Date   CREATININE 0.72 12/23/2021   BUN 18 12/23/2021   NA 143 12/23/2021   K 4.3 12/23/2021   CL 98 12/23/2021   CO2 25 12/23/2021   Lab Results  Component Value Date   ALT 30 08/03/2019   AST 20 08/03/2019   ALKPHOS 66 08/03/2019   BILITOT 0.8 08/03/2019    Lab Results  Component Value Date   HGBA1C 6.7 (H) 12/18/2021    Assessment & Plan    1.  Coronary artery disease: Patient with a somewhat long history of relatively atypical sharp and shooting chest pain and more recent finding of coronary calcifications on CT.  She underwent stress testing, which showed anterior ischemia and this was followed by diagnostic catheterization which revealed severe mid LAD disease and moderate diagonal disease.  She also had mild circumflex and RCA disease.  The LAD was successfully with drug-eluting stent while the diagonal was treated with PTCA.  Since her procedure, she has noted a mild, intermittent pressure-like sensation that might occur throughout the day without associated symptoms, lasting a few seconds to minutes, resolving spontaneously.  She has been active, walking without any symptoms or limitations.  Her EKG today is normal.  I do not think her symptoms represent angina or in-stent restenosis, though she will continue to monitor.  She is interested in cardiac  rehabilitation here at [facility], and I will place an order ([place] but [place] not want to go to [facility] or [place]).  Continue aspirin, statin, Plavix.  2.  Essential hypertension: Blood pressure is soft at 100/76.  She is not on any antihypertensives.  3.  Hyperlipidemia: Previously on atorvastatin 20 and now on 80 mg in the setting of recent PCI.  She will need follow-up lipids and LFTs in approximately 4 to 6 weeks and these can be obtained at her next follow-up appointment.  4.  Type 2 diabetes mellitus: A1c of 6.7 in November.  She appears to be on Ozempic only.  Followed by primary care.  5.  Morbid obesity: BMI 32.77.  She is currently on Ozempic.  She is interested in cardiac rehabilitation and I will refer.  6.  Disposition: Follow-up in 1 month with fasting lipids and LFTs at that time.   Nicolasa Duckinghristopher Sharonda Llamas, NP 01/03/2022, 5:26 PM

## 2022-01-03 NOTE — Patient Instructions (Addendum)
Medication Instructions:  Plavix refilled to your pharmacy  *If you need a refill on your cardiac medications before your next appointment, please call your pharmacy*   Lab Work: None If you have labs (blood work) drawn today and your tests are completely normal, you will receive your results only by: MyChart Message (if you have MyChart) OR A paper copy in the mail If you have any lab test that is abnormal or we need to change your treatment, we will call you to review the results.   Testing/Procedures: You have been referred to cardiac rehab at [facility]. This is a combination program including monitored exercise, dietary education, and support group. We strongly recommend participating in the program. Expect a phone call from them in approximately 2 weeks.  If it has been more than 2 weeks and you have not heard from the Cardiac Rehab Department, please call them directly at [phone]    Follow-Up: At [facility], you and your health needs are our priority.  As part of our continuing mission to provide you with exceptional heart care, we have created designated Provider Care Teams.  These Care Teams include your primary Cardiologist (physician) and Advanced Practice Providers (APPs -  Physician Assistants and Nurse Practitioners) who all work together to provide you with the care you need, when you need it.  We recommend signing up for the patient portal called "MyChart".  Sign up information is provided on this After Visit Summary.  MyChart is used to connect with patients for Virtual Visits (Telemedicine).  Patients are able to view lab/test results, encounter notes, upcoming appointments, etc.  Non-urgent messages can be sent to your provider as well.   To learn more about what you can do with MyChart, go to [email or web address].    Your next appointment:   1 month(s)  The format for your next appointment:   In Person  Provider:   Gypsy Balsam, MD    Other  Instructions none  Important Information About Sugar

## 2022-01-07 ENCOUNTER — Telehealth (HOSPITAL_COMMUNITY): Payer: Self-pay

## 2022-01-07 NOTE — Telephone Encounter (Signed)
Phase II referral for Cardiac Rehab faxed to [facility]. 

## 2022-01-08 ENCOUNTER — Encounter: Payer: Self-pay | Admitting: Cardiology

## 2022-01-08 ENCOUNTER — Ambulatory Visit: Payer: Medicaid Other | Attending: Cardiology | Admitting: Cardiology

## 2022-01-08 VITALS — BP 108/64 | HR 93 | Ht 66.0 in | Wt 204.6 lb

## 2022-01-08 DIAGNOSIS — E785 Hyperlipidemia, unspecified: Secondary | ICD-10-CM

## 2022-01-08 DIAGNOSIS — I10 Essential (primary) hypertension: Secondary | ICD-10-CM | POA: Diagnosis not present

## 2022-01-08 DIAGNOSIS — G4733 Obstructive sleep apnea (adult) (pediatric): Secondary | ICD-10-CM | POA: Diagnosis not present

## 2022-01-08 NOTE — Progress Notes (Unsigned)
Cardiology Office Note:    Date:  01/08/2022   ID:  [name], DOB 01-11-1961, MRN [id number]  PCP:  Ellis Parents, FNP  Cardiologist:  Gypsy Balsam, MD    Referring MD: Ellis Parents, FNP   Chief Complaint  Patient presents with   Follow-up    History of Present Illness:    [name] is a 61 y.o. female with past medical history significant for remote smoking, she quit 17 years ago, COPD, essential hypertension, dyslipidemia, diabetes, fibromyalgia, she was getting ready to have hip surgery when she was noted to have calcification of the coronary artery, after that stress test has been performed which showed ischemia involving LAD territory, cardiac catheterization thereafter show 90% stenosis of mid LAD which was addressed with stenting, she also had narrowing of the diagonal branch which was addressed with PTCA.  She is coming today to my office after that doing very well.  Denies of any chest pain tightness squeezing pressure burning chest.  Immediately after PTCA she had some sharp pains but since that time there is no problem.  She feels better she does not have any more chest pain.  Past Medical History:  Diagnosis Date   Allergic rhinitis    Anxiety    Asthma    CAD (coronary artery disease)    a. 11/2021 MV: Ant ischemia. EF 52%; b. 11/2021 PCI: LAD 71m (2.25x20 Synergy XD DES), D2 60 (PTCA), LCX 30p/m, 98m/d, OM2 30, RCA large, 20p/d.   Chronic lower back pain    Chronic pain syndrome    followed by pain clinic [facility] in [place]   COPD (chronic obstructive pulmonary disease) (HCC)    DDD (degenerative disc disease), cervical    DDD (degenerative disc disease), lumbar    Depression    Diabetic peripheral neuropathy (HCC)    hands and feet   Essential (primary) hypertension    Fibromyalgia    Full dentures    only wears upper   GERD without esophagitis    History of 2019 novel coronavirus disease (COVID-19)     08-10-2018  per pt in May 2020,  recovered at home without supplemental oxygen, never admitted to hospital,  residual nasal congestion and loss of taste is coming back , no cough sore throat sob or difficultly breathing,  Negative covid test done 08-07-2018   History of pyelonephritis 09/2017   Hyperlipidemia    Hypothyroidism    IBS (irritable bowel syndrome)    no current med.   Left shoulder pain    Lumbar nerve root impingement    Lumbar post-laminectomy syndrome    Major depressive disorder    Obesity    OSA (obstructive sleep apnea)    08-10-2018  per pt has used in 8 months   Osteoarthritis    Polyneuropathy    PTSD (post-traumatic stress disorder)    Type II diabetes mellitus (HCC)    followed by pcp   Vitamin D deficiency    Wears glasses     Past Surgical History:  Procedure Laterality Date   BLADDER SUSPENSION  2006   CARPAL TUNNEL RELEASE Bilateral right 07/22/2001;  left 01-05-2003  dr Lori Pont @[facility]    CHOLECYSTECTOMY     COLONOSCOPY W/ POLYPECTOMY     CORONARY STENT INTERVENTION N/A 12/26/2021   Procedure: CORONARY STENT INTERVENTION;  Surgeon: 12/28/2021, MD;  Location: [facility];  Service: Cardiovascular;  Laterality: N/A;   DILATION AND CURETTAGE OF UTERUS  INTRAVASCULAR PRESSURE WIRE/FFR STUDY N/A 12/26/2021   Procedure: INTRAVASCULAR PRESSURE WIRE/FFR STUDY;  Surgeon: Kathleene Hazel, MD;  Location: [facility];  Service: Cardiovascular;  Laterality: N/A;   LAPAROSCOPIC CHOLECYSTECTOMY  1992   LEFT HEART CATH AND CORONARY ANGIOGRAPHY N/A 12/26/2021   Procedure: LEFT HEART CATH AND CORONARY ANGIOGRAPHY;  Surgeon: Kathleene Hazel, MD;  Location: [facility];  Service: Cardiovascular;  Laterality: N/A;   LUMBAR LAMINECTOMY  1997   L4 --5   MULTIPLE EXTRACTIONS WITH ALVEOLOPLASTY N/A 11/26/2012   Procedure: MULTIPLE EXTRACTION WITH ALVEOLOPLASTY;  Surgeon: [name], DDS;  Location: [facility];  Service: Oral  Surgery;  Laterality: N/A;   SHOULDER ARTHROSCOPY WITH ROTATOR CUFF REPAIR AND SUBACROMIAL DECOMPRESSION Left 08/11/2018   Procedure: SHOULDER ARTHROSCOPY WITH ROTATOR CUFF REPAIR AND SUBACROMIAL DECOMPRESSION and DISTAL CLAVICLE ,DEBRIDEMENT ANTERIOR LABRIL TEAR;  Surgeon: Jodi Geralds, MD;  Location: [facility];  Service: Orthopedics;  Laterality: Left;   TONSILLECTOMY  age 10   TOTAL HIP ARTHROPLASTY Left 07/10/2014   Procedure: TOTAL HIP ARTHROPLASTY ANTERIOR APPROACH;  Surgeon: Jodi Geralds, MD;  Location: [facility];  Service: Orthopedics;  Laterality: Left;   TRIGGER FINGER RELEASE Right 2018   thumb   TRIGGER FINGER RELEASE Left 10/22/2015   Procedure: LEFT TRIGGER THUMB RELEASE;  Surgeon: Mack Hook, MD;  Location: [facility];  Service: Orthopedics;  Laterality: Left;   TUBAL LIGATION  yrs ago   ULNAR NERVE TRANSPOSITION Right 03/27/2014   Procedure: RIGHT ULNAR NEUROPLASTY AT THE ELBOW;  Surgeon: Jodi Marble, MD;  Location: [facility];  Service: Orthopedics;  Laterality: Right;   ULNAR NERVE TRANSPOSITION Left 12/08/2016   Procedure: LEFT ULNAR NEUROPLASTY AT THE ELBOW;  Surgeon: Mack Hook, MD;  Location: [facility];  Service: Orthopedics;  Laterality: Left;   UPPER GASTROINTESTINAL ENDOSCOPY     VAGINAL HYSTERECTOMY  2006   w/  ANTERIOR AND POSTERIOR REPAIR WITH SLING SUSPENSION    Current Medications: Current Meds  Medication Sig   acetaminophen (TYLENOL) 500 MG tablet Take 1,000 mg by mouth every 6 (six) hours as needed for moderate pain.   albuterol (PROVENTIL HFA;VENTOLIN HFA) 108 (90 Base) MCG/ACT inhaler Inhale 2 puffs into the lungs every 6 (six) hours as needed for wheezing or shortness of breath.   aspirin EC 81 MG tablet Take 1 tablet (81 mg total) by mouth daily. Swallow whole.   atorvastatin (LIPITOR) 80 MG tablet Take 1 tablet (80 mg total) by mouth daily.   budesonide-formoterol (SYMBICORT)  160-4.5 MCG/ACT inhaler Inhale 1 puff into the lungs daily.   clopidogrel (PLAVIX) 75 MG tablet Take 1 tablet (75 mg total) by mouth daily with breakfast.   cyclobenzaprine (FLEXERIL) 10 MG tablet Take 1 tablet (10 mg total) by mouth 3 (three) times daily as needed for muscle spasms.   FLUoxetine (PROZAC) 40 MG capsule Take 40 mg by mouth daily.   fluticasone (FLONASE) 50 MCG/ACT nasal spray Place 2 sprays into both nostrils daily.   HYDROcodone-acetaminophen (NORCO) 10-325 MG tablet Take 1 tablet by mouth 2 (two) times daily as needed for moderate pain.   levothyroxine (SYNTHROID) 75 MCG tablet Take 75 mcg by mouth daily before breakfast.   loratadine (CLARITIN) 10 MG tablet Take 10 mg by mouth daily as needed for allergies or rhinitis.   morphine (MS CONTIN) 15 MG 12 hr tablet Take 15 mg by mouth 3 (three) times daily.   Multiple Vitamins-Minerals (ONE A  DAY WOMEN 50 PLUS PO) Take 1 tablet by mouth daily.   nitroGLYCERIN (NITROSTAT) 0.4 MG SL tablet Place 1 tablet (0.4 mg total) under the tongue every 5 (five) minutes as needed for chest pain.   OZEMPIC, 2 MG/DOSE, 8 MG/3ML SOPN Inject 2 mg into the skin every Tuesday.   pantoprazole (PROTONIX) 40 MG tablet Take 40 mg by mouth 2 (two) times daily.   pregabalin (LYRICA) 200 MG capsule Take 200 mg by mouth 3 (three) times daily.   QUEtiapine (SEROQUEL) 100 MG tablet Take 100 mg at bedtime by mouth.   rOPINIRole (REQUIP) 0.25 MG tablet Take 0.25 mg by mouth at bedtime.    tiotropium (SPIRIVA) 18 MCG inhalation capsule Place 18 mcg into inhaler and inhale daily.   VASCEPA 1 g capsule Take 1 g by mouth 2 (two) times daily.   zolpidem (AMBIEN) 10 MG tablet Take 5 mg by mouth at bedtime.     Allergies:   Adhesive [tape], Latex, Dilaudid [hydromorphone], and Oxycodone   Social History   Socioeconomic History   Marital status: Divorced    Spouse name: Not on file   Number of children: Not on file   Years of education: Not on file   Highest  education level: Not on file  Occupational History   Not on file  Tobacco Use   Smoking status: Former    Packs/day: 1.50    Years: 30.00    Total pack years: 45.00    Types: Cigarettes    Quit date: 12/01/2007    Years since quitting: 14.1   Smokeless tobacco: Never  Vaping Use   Vaping Use: Never used  Substance and Sexual Activity   Alcohol use: Not Currently   Drug use: Never   Sexual activity: Yes    Birth control/protection: Surgical  Other Topics Concern   Not on file  Social History Narrative   Not on file   Social Determinants of Health   Financial Resource Strain: Not on file  Food Insecurity: Not on file  Transportation Needs: Not on file  Physical Activity: Not on file  Stress: Not on file  Social Connections: Not on file     Family History: The patient's family history includes Diabetes in her mother; Heart disease in her mother; Hypertension in her mother; Mental illness in her mother. ROS:   Please see the history of present illness.    All 14 point review of systems negative except as described per history of present illness  EKGs/Labs/Other Studies Reviewed:      Recent Labs: 12/23/2021: BUN 18; Creatinine, Ser 0.72; Hemoglobin 14.1; Platelets 225; Potassium 4.3; Sodium 143  Recent Lipid Panel No results found for: "CHOL", "TRIG", "HDL", "CHOLHDL", "VLDL", "LDLCALC", "LDLDIRECT"  Physical Exam:    VS:  BP 108/64 (BP Location: Left Arm, Patient Position: Sitting)   Pulse 93   Ht 5\' 6"  (1.676 m)   Wt 204 lb 9.6 oz (92.8 kg)   SpO2 94%   BMI 33.02 kg/m     Wt Readings from Last 3 Encounters:  01/08/22 204 lb 9.6 oz (92.8 kg)  01/03/22 203 lb (92.1 kg)  12/26/21 203 lb (92.1 kg)     GEN:  Well nourished, well developed in no acute distress HEENT: Normal NECK: No JVD; No carotid bruits LYMPHATICS: No lymphadenopathy CARDIAC: RRR, no murmurs, no rubs, no gallops RESPIRATORY:  Clear to auscultation without rales, wheezing or rhonchi   ABDOMEN: Soft, non-tender, non-distended MUSCULOSKELETAL:  No edema; No deformity  SKIN: Warm and dry LOWER EXTREMITIES: no swelling NEUROLOGIC:  Alert and oriented x 3 PSYCHIATRIC:  Normal affect   ASSESSMENT:    1. Essential hypertension   2. Obstructive sleep apnea   3. Dyslipidemia    PLAN:    In order of problems listed above:  Coronary artery disease status post PTCA and stenting of the LAD with drug-eluting stent to the, she is on dual antiplatelet therapy which I will continue.  She denies having the symptoms that will indicate reactivation of the problem Dyslipidemia I I increase dose of her statin to high intensity level she is taking Lipitor 80 which I will continue in about 4 weeks we will recheck fasting lipid profile. Essential hypertension: Blood pressure seems to well-controlled continue present management. We talked about the need to exercise on the regular basis as well as good diet she is aware of this and she is trying to do that. I did review record from hospital for this visit   Medication Adjustments/Labs and Tests Ordered: Current medicines are reviewed at length with the patient today.  Concerns regarding medicines are outlined above.  Orders Placed This Encounter  Procedures   ALT   AST   Lipid panel   Medication changes: No orders of the defined types were placed in this encounter.   Signed, Georgeanna Leaobert J. Davidlee Jeanbaptiste, MD, [facility] Rehabilitation CenterFACC 01/08/2022 3:14 PM    [facility]

## 2022-01-08 NOTE — Patient Instructions (Signed)
Medication Instructions:  Your physician recommends that you continue on your current medications as directed. Please refer to the Current Medication list given to you today.  *If you need a refill on your cardiac medications before your next appointment, please call your pharmacy*   Lab Work: Your physician recommends that you return for lab work in: 1 month You need to have labs done when you are fasting.  You can come Monday through Friday 8:30 am to 12:00 pm and 1:15 to 4:30. You do not need to make an appointment as the order has already been placed. The labs you are going to have done are BMET, CBC, TSH, LFT and Lipids.    Testing/Procedures: None Ordered   Follow-Up: At [facility], you and your health needs are our priority.  As part of our continuing mission to provide you with exceptional heart care, we have created designated Provider Care Teams.  These Care Teams include your primary Cardiologist (physician) and Advanced Practice Providers (APPs -  Physician Assistants and Nurse Practitioners) who all work together to provide you with the care you need, when you need it.  We recommend signing up for the patient portal called "MyChart".  Sign up information is provided on this After Visit Summary.  MyChart is used to connect with patients for Virtual Visits (Telemedicine).  Patients are able to view lab/test results, encounter notes, upcoming appointments, etc.  Non-urgent messages can be sent to your provider as well.   To learn more about what you can do with MyChart, go to [email or web address].    Your next appointment:   3 month(s)  The format for your next appointment:   In Person  Provider:   Gypsy Balsam, MD    Other Instructions NA

## 2022-01-22 ENCOUNTER — Telehealth: Payer: Self-pay | Admitting: Cardiology

## 2022-01-22 MED ORDER — ATORVASTATIN CALCIUM 80 MG PO TABS: 80.0000 mg | ORAL_TABLET | Freq: Every day | ORAL | 3 refills | Status: DC

## 2022-01-22 NOTE — Telephone Encounter (Signed)
Pt c/o medication issue:  1. Name of Medication:  atorvastatin (LIPITOR) 80 MG tablet   2. How are you currently taking this medication (dosage and times per day)? As prescribed   3. Are you having a reaction (difficulty breathing--STAT)? No   4. What is your medication issue? Medication was sent to wrong pharmacy. Needs [facility] [place]. Patient only has two tablets left.

## 2022-01-22 NOTE — Telephone Encounter (Signed)
Refill of Atorvastatin 80 mg sent to [facility].

## 2022-02-19 ENCOUNTER — Ambulatory Visit: Payer: Medicaid Other | Admitting: Cardiology

## 2022-02-21 ENCOUNTER — Telehealth: Payer: Self-pay

## 2022-02-21 ENCOUNTER — Telehealth: Payer: Self-pay | Admitting: Cardiology

## 2022-02-21 NOTE — Telephone Encounter (Signed)
Stabbing pain in chest on L-side while at home yesterday about 4-5pm. States she wasn't doing much activity wise at the time of onset. The pains would occur, stop, but come back with activity. Took 1 Nitroglycerin and states after 10 minutes, the pains resolved and didn't recur. Had another appt yesterday and provider stated BP "was low" but wasn't told what number was. Denies any radiation of pain to shoulder/neck/jaw/arm. No nausea, vomiting, or vision changes. Denies SOB, but states she started taking deep breaths to see if it helped-it didn't. No issues throughout night, but sensation recurred this morning while getting out of bed to go to bathroom. States not as worse as yesterday. Did not take NTG today and states that this episode "only had 2-3 stabbing pains and each one didn't last long." Advised her that this doesn't sound like coronary pain, but more stress induced or pericarditis/costochrondritis. She admits to being under larger amounts of stress lately. She is asking if MD will give her something for it or recommend something for her to do. Routing to MD for review/advisement.

## 2022-02-21 NOTE — Telephone Encounter (Signed)
Patient aware of the following message per Dr. Raliegh Ip: If chest pain recur especially if it is different kind pressure tightness squeezing pressure burning in the chest she need to go to the emergency room.  In terms of medication to help relieve stress that should come from primary care physician   She has ask me to forward this to her PCP. Faxed

## 2022-02-21 NOTE — Telephone Encounter (Signed)
Pt c/o of Chest Pain: STAT if CP now or developed within 24 hours  1. Are you having CP right now? no  2. Are you experiencing any other symptoms (ex. SOB, nausea, vomiting, sweating)? No   3. How long have you been experiencing CP? Since last night   4. Is your CP continuous or coming and going? Coming and going, but this morning it was continuous for 5 mins like a stabbing pain   5. Have you taken Nitroglycerin?  1 ?

## 2022-02-24 NOTE — Telephone Encounter (Signed)
Called patient and informed her of Dr. Wendy Poet recommendation below:  "If chest pain recur especially if it is different kind pressure tightness squeezing pressure burning in the chest she need to go to the emergency room.  In terms of medication to help relieve stress that should come from primary care physician"  Patient was appreciative for the call and had no further questions at this time.

## 2022-03-20 ENCOUNTER — Ambulatory Visit: Payer: Medicaid Other | Admitting: Cardiology

## 2022-04-03 ENCOUNTER — Encounter: Payer: Self-pay | Admitting: Cardiology

## 2022-04-03 ENCOUNTER — Ambulatory Visit: Payer: Medicaid Other | Attending: Cardiology | Admitting: Cardiology

## 2022-04-03 VITALS — BP 100/60 | HR 84 | Ht 66.0 in | Wt 210.0 lb

## 2022-04-03 DIAGNOSIS — I2510 Atherosclerotic heart disease of native coronary artery without angina pectoris: Secondary | ICD-10-CM

## 2022-04-03 DIAGNOSIS — I10 Essential (primary) hypertension: Secondary | ICD-10-CM | POA: Diagnosis not present

## 2022-04-03 DIAGNOSIS — E785 Hyperlipidemia, unspecified: Secondary | ICD-10-CM | POA: Diagnosis not present

## 2022-04-03 DIAGNOSIS — E1142 Type 2 diabetes mellitus with diabetic polyneuropathy: Secondary | ICD-10-CM

## 2022-04-03 HISTORY — DX: Hyperlipidemia, unspecified: E78.5

## 2022-04-03 NOTE — Progress Notes (Signed)
Cardiology Office Note:    Date:  04/03/2022   ID:  Suzi Roots, DOB Aug 06, 1960, MRN [id number]  PCP:  Herbert Deaner, FNP  Cardiologist:  Jenne Campus, MD    Referring MD: Rhea Bleacher, NP   Chief Complaint  Patient presents with   Follow-up    History of Present Illness:    [name] is a 62 y.o. female  with past medical history significant for remote smoking, she quit 17 years ago, COPD, essential hypertension, dyslipidemia, diabetes, fibromyalgia, she was getting ready to have hip surgery when she was noted to have calcification of the coronary artery, after that stress test has been performed which showed ischemia involving LAD territory, cardiac catheterization thereafter show 90% stenosis of mid LAD which was addressed with stenting, she also had narrowing of the diagonal branch which was addressed with PTCA. She is coming today to my office after that doing very well. Denies of any chest pain tightness squeezing pressure burning chest.  She comes today to months for follow-up.  Overall she is doing quite well.  Denies have any chest pain tightness squeezing pressure burning chest.  She describes rare episode of sharp stabbing-like sensation to chest not related to exercise.  Past Medical History:  Diagnosis Date   Allergic rhinitis    Anxiety    Asthma    CAD (coronary artery disease)    a. 11/2021 MV: Ant ischemia. EF 52%; b. 11/2021 PCI: LAD 59m(2.25x20 Synergy XD DES), D2 60 (PTCA), LCX 30p/m, 438m, OM2 30, RCA large, 20p/d.   Chronic lower back pain    Chronic pain syndrome    followed by pain clinic [facility] in [place]   COPD (chronic obstructive pulmonary disease) ([facility]   DDD (degenerative disc disease), cervical    DDD (degenerative disc disease), lumbar    Depression    Diabetic peripheral neuropathy ([facility]   hands and feet   Essential (primary) hypertension    Fibromyalgia    Full dentures    only wears  upper   GERD without esophagitis    History of 2019 novel coronavirus disease (COVID-19)    08-10-2018  per pt in May 2020,  recovered at home without supplemental oxygen, never admitted to hospital,  residual nasal congestion and loss of taste is coming back , no cough sore throat sob or difficultly breathing,  Negative covid test done 08-07-2018   History of pyelonephritis 09/2017   Hyperlipidemia    Hypothyroidism    IBS (irritable bowel syndrome)    no current med.   Left shoulder pain    Lumbar nerve root impingement    Lumbar post-laminectomy syndrome    Major depressive disorder    Obesity    OSA (obstructive sleep apnea)    08-10-2018  per pt has used in 8 months   Osteoarthritis    Polyneuropathy    PTSD (post-traumatic stress disorder)    Type II diabetes mellitus ([facility]   followed by pcp   Vitamin D deficiency    Wears glasses     Past Surgical History:  Procedure Laterality Date   BLADDER SUSPENSION  2006   CARPAL TUNNEL RELEASE Bilateral right 07/22/2001;  left 01-05-2003  dr muPercell Miller'@[facility]'l$    CHOLECYSTECTOMY     COLONOSCOPY W/ POLYPECTOMY     CORONARY STENT INTERVENTION N/A 12/26/2021   Procedure: CORONARY STENT INTERVENTION;  Surgeon: McBurnell BlanksMD;  [facility];  Service: Cardiovascular;  Laterality: N/A;  DILATION AND CURETTAGE OF UTERUS     INTRAVASCULAR PRESSURE WIRE/FFR STUDY N/A 12/26/2021   Procedure: INTRAVASCULAR PRESSURE WIRE/FFR STUDY;  Surgeon: Burnell Blanks, MD;  Location: [place] [facility];  Service: Cardiovascular;  Laterality: N/A;   LAPAROSCOPIC CHOLECYSTECTOMY  1992   LEFT HEART CATH AND CORONARY ANGIOGRAPHY N/A 12/26/2021   Procedure: LEFT HEART CATH AND CORONARY ANGIOGRAPHY;  Surgeon: Burnell Blanks, MD;  Location: [facility];  Service: Cardiovascular;  Laterality: N/A;   LUMBAR LAMINECTOMY  1997   L4 --5   MULTIPLE EXTRACTIONS WITH ALVEOLOPLASTY N/A 11/26/2012   Procedure: MULTIPLE  EXTRACTION WITH ALVEOLOPLASTY;  Surgeon: Gae Bon, DDS;  Location: [facility];  Service: Oral Surgery;  Laterality: N/A;   SHOULDER ARTHROSCOPY WITH ROTATOR CUFF REPAIR AND SUBACROMIAL DECOMPRESSION Left 08/11/2018   Procedure: SHOULDER ARTHROSCOPY WITH ROTATOR CUFF REPAIR AND SUBACROMIAL DECOMPRESSION and DISTAL CLAVICLE ,DEBRIDEMENT ANTERIOR LABRIL TEAR;  Surgeon: Dorna Leitz, MD;  Location: [facility];  Service: Orthopedics;  Laterality: Left;   TONSILLECTOMY  age 36   TOTAL HIP ARTHROPLASTY Left 07/10/2014   Procedure: TOTAL HIP ARTHROPLASTY ANTERIOR APPROACH;  Surgeon: Dorna Leitz, MD;  Location: [facility];  Service: Orthopedics;  Laterality: Left;   TRIGGER FINGER RELEASE Right 2018   thumb   TRIGGER FINGER RELEASE Left 10/22/2015   Procedure: LEFT TRIGGER THUMB RELEASE;  Surgeon: Milly Jakob, MD;  Location: [facility];  Service: Orthopedics;  Laterality: Left;   TUBAL LIGATION  yrs ago   ULNAR NERVE TRANSPOSITION Right 03/27/2014   Procedure: RIGHT ULNAR NEUROPLASTY AT THE ELBOW;  Surgeon: Jolyn Nap, MD;  Location: [facility];  Service: Orthopedics;  Laterality: Right;   ULNAR NERVE TRANSPOSITION Left 12/08/2016   Procedure: LEFT ULNAR NEUROPLASTY AT THE ELBOW;  Surgeon: Milly Jakob, MD;  Location: [facility];  Service: Orthopedics;  Laterality: Left;   UPPER GASTROINTESTINAL ENDOSCOPY     VAGINAL HYSTERECTOMY  2006   w/  ANTERIOR AND POSTERIOR REPAIR WITH SLING SUSPENSION    Current Medications: Current Meds  Medication Sig   acetaminophen (TYLENOL) 500 MG tablet Take 1,000 mg by mouth every 6 (six) hours as needed for moderate pain.   albuterol (PROVENTIL HFA;VENTOLIN HFA) 108 (90 Base) MCG/ACT inhaler Inhale 2 puffs into the lungs every 6 (six) hours as needed for wheezing or shortness of breath.   aspirin EC 81 MG tablet Take 1 tablet (81 mg total) by mouth daily. Swallow whole.   atorvastatin (LIPITOR) 80 MG  tablet Take 1 tablet (80 mg total) by mouth daily.   Azelastine HCl 137 MCG/SPRAY SOLN Place 2 sprays into both nostrils daily.   budesonide-formoterol (SYMBICORT) 160-4.5 MCG/ACT inhaler Inhale 1 puff into the lungs daily.   busPIRone (BUSPAR) 5 MG tablet Take 5 mg by mouth 2 (two) times daily.   clopidogrel (PLAVIX) 75 MG tablet Take 1 tablet (75 mg total) by mouth daily with breakfast.   cyclobenzaprine (FLEXERIL) 10 MG tablet Take 1 tablet (10 mg total) by mouth 3 (three) times daily as needed for muscle spasms.   FLUoxetine (PROZAC) 40 MG capsule Take 40 mg by mouth daily.   fluticasone (FLONASE) 50 MCG/ACT nasal spray Place 2 sprays into both nostrils daily.   HYDROcodone-acetaminophen (NORCO) 10-325 MG tablet Take 1 tablet by mouth 2 (two) times daily as needed for moderate pain.   levothyroxine (SYNTHROID) 75 MCG tablet Take 75 mcg by mouth daily before breakfast.   loratadine (CLARITIN) 10  MG tablet Take 10 mg by mouth daily as needed for allergies or rhinitis.   morphine (MS CONTIN) 15 MG 12 hr tablet Take 15 mg by mouth 3 (three) times daily.   Multiple Vitamins-Minerals (ONE A DAY WOMEN 50 PLUS PO) Take 1 tablet by mouth daily.   nitroGLYCERIN (NITROSTAT) 0.4 MG SL tablet Place 1 tablet (0.4 mg total) under the tongue every 5 (five) minutes as needed for chest pain.   OZEMPIC, 2 MG/DOSE, 8 MG/3ML SOPN Inject 2 mg into the skin every Tuesday.   pantoprazole (PROTONIX) 40 MG tablet Take 40 mg by mouth 2 (two) times daily.   pregabalin (LYRICA) 200 MG capsule Take 200 mg by mouth 3 (three) times daily.   QUEtiapine (SEROQUEL) 100 MG tablet Take 100 mg at bedtime by mouth.   rOPINIRole (REQUIP) 0.25 MG tablet Take 0.25 mg by mouth at bedtime.    tiotropium (SPIRIVA) 18 MCG inhalation capsule Place 18 mcg into inhaler and inhale daily.   VASCEPA 1 g capsule Take 1 g by mouth 2 (two) times daily.   zolpidem (AMBIEN) 10 MG tablet Take 5 mg by mouth at bedtime.     Allergies:   Adhesive  [tape], Latex, Dilaudid [hydromorphone], and Oxycodone   Social History   Socioeconomic History   Marital status: Divorced    Spouse name: Not on file   Number of children: Not on file   Years of education: Not on file   Highest education level: Not on file  Occupational History   Not on file  Tobacco Use   Smoking status: Former    Packs/day: 1.50    Years: 30.00    Total pack years: 45.00    Types: Cigarettes    Quit date: 12/01/2007    Years since quitting: 14.3   Smokeless tobacco: Never  Vaping Use   Vaping Use: Never used  Substance and Sexual Activity   Alcohol use: Not Currently   Drug use: Never   Sexual activity: Yes    Birth control/protection: Surgical  Other Topics Concern   Not on file  Social History Narrative   Not on file   Social Determinants of Health   Financial Resource Strain: Not on file  Food Insecurity: Not on file  Transportation Needs: Not on file  Physical Activity: Not on file  Stress: Not on file  Social Connections: Not on file     Family History: The patient's family history includes Diabetes in her mother; Heart disease in her mother; Hypertension in her mother; Mental illness in her mother. ROS:   Please see the history of present illness.    All 14 point review of systems negative except as described per history of present illness  EKGs/Labs/Other Studies Reviewed:      Recent Labs: 12/23/2021: BUN 18; Creatinine, Ser 0.72; Hemoglobin 14.1; Platelets 225; Potassium 4.3; Sodium 143  Recent Lipid Panel No results found for: "CHOL", "TRIG", "HDL", "CHOLHDL", "VLDL", "LDLCALC", "LDLDIRECT"  Physical Exam:    VS:  BP 100/60 (BP Location: Right Arm, Patient Position: Sitting, Cuff Size: Normal)   Pulse 84   Ht '5\' 6"'$  (1.676 m)   Wt 210 lb (95.3 kg)   SpO2 92%   BMI 33.89 kg/m     Wt Readings from Last 3 Encounters:  04/03/22 210 lb (95.3 kg)  01/08/22 204 lb 9.6 oz (92.8 kg)  01/03/22 203 lb (92.1 kg)     GEN:  Well  nourished, well developed in no acute distress  HEENT: Normal NECK: No JVD; No carotid bruits LYMPHATICS: No lymphadenopathy CARDIAC: RRR, no murmurs, no rubs, no gallops RESPIRATORY:  Clear to auscultation without rales, wheezing or rhonchi  ABDOMEN: Soft, non-tender, non-distended MUSCULOSKELETAL:  No edema; No deformity  SKIN: Warm and dry LOWER EXTREMITIES: no swelling NEUROLOGIC:  Alert and oriented x 3 PSYCHIATRIC:  Normal affect   ASSESSMENT:    1. Coronary artery disease involving native coronary artery of native heart without angina pectoris   2. Diabetic polyneuropathy associated with type 2 diabetes mellitus ([place])   3. Essential hypertension   4. Dyslipidemia    PLAN:    In order of problems listed above:  Coronary artery disease status post PTCA and stenting of left anterior descending artery and diagonal branch.  Doing well from that point review she is on dual antiplatelet therapy which I will continue. Dyslipidemia: She is on high intense statin which I will continue.  I did review K PN which show me her LDL of 73 HDL 42 this is from February 12, 2022. Diabetes that being followed by antimedicine team, last hemoglobin A1c 7.3.  It need to be better controlled to avoid potential cardiac complication in the future. Essential hypertension blood pressure well-controlled continue present management. We did talk about need to exercise on the regular basis which she is eager to start doing.   Medication Adjustments/Labs and Tests Ordered: Current medicines are reviewed at length with the patient today.  Concerns regarding medicines are outlined above.  No orders of the defined types were placed in this encounter.  Medication changes: No orders of the defined types were placed in this encounter.   Signed, Park Liter, MD, [facility] 04/03/2022 9:57 AM    [place]

## 2022-04-03 NOTE — Patient Instructions (Signed)

## 2022-05-16 ENCOUNTER — Other Ambulatory Visit: Payer: Self-pay

## 2022-06-11 ENCOUNTER — Telehealth: Payer: Self-pay | Admitting: Cardiology

## 2022-06-11 NOTE — Telephone Encounter (Signed)
Pt c/o medication issue:  1. Name of Medication:   clopidogrel (PLAVIX) 75 MG tablet   2. How are you currently taking this medication (dosage and times per day)?  As prescribed  3. Are you having a reaction (difficulty breathing--STAT)?  No  4. What is your medication issue?   Patient stated she was told she would only be on this medication for 6 months and wants to know when she should stop taking this medication.

## 2022-06-14 ENCOUNTER — Encounter: Payer: Self-pay | Admitting: Cardiology

## 2022-06-16 NOTE — Addendum Note (Signed)
Addended by: Eleonore Chiquito on: 06/16/2022 04:55 PM   Modules accepted: Orders

## 2022-06-16 NOTE — Telephone Encounter (Signed)
Left vm for pt to callback 

## 2022-06-16 NOTE — Telephone Encounter (Signed)
Recommendations reviewed with pt as per Dr. Vanetta Shawl note.  Pt verbalized understanding and had no additional questions. Pt will stop Plavix 06/27/22.

## 2022-06-23 NOTE — Progress Notes (Deleted)
Psychiatric Initial Adult Assessment   Patient Identification: [name] MRN:  [id number] Date of Evaluation:  06/23/2022 Referral Source: *** Chief Complaint:  No chief complaint on file.  Visit Diagnosis: No diagnosis found.  History of Present Illness:   [name] is a 62 y.o. year old female with a history of depression, COPD, CAD, essential hypertension, dyslipidemia, diabetes, diabetic neuropathy, fibromyalgia, sleep apnea, GERD, hypothyroidism, who is referred for depression.   On clonazepam 0.25-0.5 mg tidprn Buspirone 5 mg tid Ambien 10 mg       Associated Signs/Symptoms: Depression Symptoms:  {DEPRESSION SYMPTOMS:20000} (Hypo) Manic Symptoms:  {BHH MANIC SYMPTOMS:22872} Anxiety Symptoms:  {BHH ANXIETY SYMPTOMS:22873} Psychotic Symptoms:  {BHH PSYCHOTIC SYMPTOMS:22874} PTSD Symptoms: {BHH PTSD SYMPTOMS:22875}  Past Psychiatric History:  Outpatient:  Psychiatry admission:  Previous suicide attempt:  Past trials of medication:  History of violence:  History of head injury:   Previous Psychotropic Medications: {YES/NO:21197}  Substance Abuse History in the last 12 months:  {yes no:314532}  Consequences of Substance Abuse: {BHH CONSEQUENCES OF SUBSTANCE ABUSE:22880}  Past Medical History:  Past Medical History:  Diagnosis Date   Allergic rhinitis    Anxiety    Asthma    CAD (coronary artery disease)    a. 11/2021 MV: Ant ischemia. EF 52%; b. 11/2021 PCI: LAD 26m (2.25x20 Synergy XD DES), D2 60 (PTCA), LCX 30p/m, 39m/d, OM2 30, RCA large, 20p/d.   Chronic lower back pain    Chronic pain syndrome    followed by pain clinic [facility] in [place]   COPD (chronic obstructive pulmonary disease) (HCC)    DDD (degenerative disc disease), cervical    DDD (degenerative disc disease), lumbar    Depression    Diabetic peripheral neuropathy (HCC)    hands and feet   Essential (primary) hypertension    Fibromyalgia    Full  dentures    only wears upper   GERD without esophagitis    History of 2019 novel coronavirus disease (COVID-19)    08-10-2018  per pt in May 2020,  recovered at home without supplemental oxygen, never admitted to hospital,  residual nasal congestion and loss of taste is coming back , no cough sore throat sob or difficultly breathing,  Negative covid test done 08-07-2018   History of pyelonephritis 09/2017   Hyperlipidemia    Hypothyroidism    IBS (irritable bowel syndrome)    no current med.   Left shoulder pain    Lumbar nerve root impingement    Lumbar post-laminectomy syndrome    Major depressive disorder    Obesity    OSA (obstructive sleep apnea)    08-10-2018  per pt has used in 8 months   Osteoarthritis    Polyneuropathy    PTSD (post-traumatic stress disorder)    Type II diabetes mellitus (HCC)    followed by pcp   Vitamin D deficiency    Wears glasses     Past Surgical History:  Procedure Laterality Date   BLADDER SUSPENSION  2006   CARPAL TUNNEL RELEASE Bilateral right 07/22/2001;  left 01-05-2003  dr Eulah Pont @[facility]    CHOLECYSTECTOMY     COLONOSCOPY W/ POLYPECTOMY     CORONARY PRESSURE/FFR STUDY N/A 12/26/2021   Procedure: INTRAVASCULAR PRESSURE WIRE/FFR STUDY;  Surgeon: Kathleene Hazel, MD;  Location: [facility];  Service: Cardiovascular;  Laterality: N/A;   CORONARY STENT INTERVENTION N/A 12/26/2021   Procedure: CORONARY STENT INTERVENTION;  Surgeon: Kathleene Hazel, MD;  Location: [facility];  Service: Cardiovascular;  Laterality: N/A;   DILATION AND CURETTAGE OF UTERUS     LAPAROSCOPIC CHOLECYSTECTOMY  1992   LEFT HEART CATH AND CORONARY ANGIOGRAPHY N/A 12/26/2021   Procedure: LEFT HEART CATH AND CORONARY ANGIOGRAPHY;  Surgeon: Kathleene Hazel, MD;  Location: [facility];  Service: Cardiovascular;  Laterality: N/A;   LUMBAR LAMINECTOMY  1997   L4 --5   MULTIPLE EXTRACTIONS WITH ALVEOLOPLASTY N/A 11/26/2012    Procedure: MULTIPLE EXTRACTION WITH ALVEOLOPLASTY;  Surgeon: [name], DDS;  Location: [facility];  Service: Oral Surgery;  Laterality: N/A;   SHOULDER ARTHROSCOPY WITH ROTATOR CUFF REPAIR AND SUBACROMIAL DECOMPRESSION Left 08/11/2018   Procedure: SHOULDER ARTHROSCOPY WITH ROTATOR CUFF REPAIR AND SUBACROMIAL DECOMPRESSION and DISTAL CLAVICLE ,DEBRIDEMENT ANTERIOR LABRIL TEAR;  Surgeon: Jodi Geralds, MD;  Location: [facility];  Service: Orthopedics;  Laterality: Left;   TONSILLECTOMY  age 15   TOTAL HIP ARTHROPLASTY Left 07/10/2014   Procedure: TOTAL HIP ARTHROPLASTY ANTERIOR APPROACH;  Surgeon: Jodi Geralds, MD;  Location: [facility];  Service: Orthopedics;  Laterality: Left;   TRIGGER FINGER RELEASE Right 2018   thumb   TRIGGER FINGER RELEASE Left 10/22/2015   Procedure: LEFT TRIGGER THUMB RELEASE;  Surgeon: Mack Hook, MD;  Location: [facility];  Service: Orthopedics;  Laterality: Left;   TUBAL LIGATION  yrs ago   ULNAR NERVE TRANSPOSITION Right 03/27/2014   Procedure: RIGHT ULNAR NEUROPLASTY AT THE ELBOW;  Surgeon: Jodi Marble, MD;  Location: [facility];  Service: Orthopedics;  Laterality: Right;   ULNAR NERVE TRANSPOSITION Left 12/08/2016   Procedure: LEFT ULNAR NEUROPLASTY AT THE ELBOW;  Surgeon: Mack Hook, MD;  Location: [facility];  Service: Orthopedics;  Laterality: Left;   UPPER GASTROINTESTINAL ENDOSCOPY     VAGINAL HYSTERECTOMY  2006   w/  ANTERIOR AND POSTERIOR REPAIR WITH SLING SUSPENSION    Family Psychiatric History: ***  Family History:  Family History  Problem Relation Age of Onset   Diabetes Mother    Heart disease Mother    Hypertension Mother    Mental illness Mother     Social History:   Social History   Socioeconomic History   Marital status: Divorced    Spouse name: Not on file   Number of children: Not on file   Years of education: Not on file   Highest education level: Not on file   Occupational History   Not on file  Tobacco Use   Smoking status: Former    Packs/day: 1.50    Years: 30.00    Additional pack years: 0.00    Total pack years: 45.00    Types: Cigarettes    Quit date: 12/01/2007    Years since quitting: 14.5   Smokeless tobacco: Never  Vaping Use   Vaping Use: Never used  Substance and Sexual Activity   Alcohol use: Not Currently   Drug use: Never   Sexual activity: Yes    Birth control/protection: Surgical  Other Topics Concern   Not on file  Social History Narrative   Not on file   Social Determinants of Health   Financial Resource Strain: Not on file  Food Insecurity: Not on file  Transportation Needs: Not on file  Physical Activity: Not on file  Stress: Not on file  Social Connections: Not on file    Additional Social History: ***  Allergies:   Allergies  Allergen Reactions   Adhesive [Tape] Hives   Latex Hives  Dilaudid [Hydromorphone] Hives   Oxycodone Itching    Metabolic Disorder Labs: Lab Results  Component Value Date   HGBA1C 6.7 (H) 12/18/2021   MPG 145.59 12/18/2021   MPG 163 06/30/2014   No results found for: "PROLACTIN" No results found for: "CHOL", "TRIG", "HDL", "CHOLHDL", "VLDL", "LDLCALC" No results found for: "TSH"  Therapeutic Level Labs: No results found for: "LITHIUM" No results found for: "CBMZ" No results found for: "VALPROATE"  Current Medications: Current Outpatient Medications  Medication Sig Dispense Refill   acetaminophen (TYLENOL) 500 MG tablet Take 1,000 mg by mouth every 6 (six) hours as needed for moderate pain.     albuterol (PROVENTIL HFA;VENTOLIN HFA) 108 (90 Base) MCG/ACT inhaler Inhale 2 puffs into the lungs every 6 (six) hours as needed for wheezing or shortness of breath.     aspirin EC 81 MG tablet Take 1 tablet (81 mg total) by mouth daily. Swallow whole. 90 tablet 3   atorvastatin (LIPITOR) 80 MG tablet Take 1 tablet (80 mg total) by mouth daily. 90 tablet 3   Azelastine  HCl 137 MCG/SPRAY SOLN Place 2 sprays into both nostrils daily.     budesonide-formoterol (SYMBICORT) 160-4.5 MCG/ACT inhaler Inhale 1 puff into the lungs daily.     busPIRone (BUSPAR) 5 MG tablet Take 5 mg by mouth 2 (two) times daily.     cyclobenzaprine (FLEXERIL) 10 MG tablet Take 1 tablet (10 mg total) by mouth 3 (three) times daily as needed for muscle spasms. 30 tablet 0   FLUoxetine (PROZAC) 40 MG capsule Take 40 mg by mouth daily.     fluticasone (FLONASE) 50 MCG/ACT nasal spray Place 2 sprays into both nostrils daily.     HYDROcodone-acetaminophen (NORCO) 10-325 MG tablet Take 1 tablet by mouth 2 (two) times daily as needed for moderate pain.     levothyroxine (SYNTHROID) 75 MCG tablet Take 75 mcg by mouth daily before breakfast.     loratadine (CLARITIN) 10 MG tablet Take 10 mg by mouth daily as needed for allergies or rhinitis.     morphine (MS CONTIN) 15 MG 12 hr tablet Take 15 mg by mouth 3 (three) times daily.     Multiple Vitamins-Minerals (ONE A DAY WOMEN 50 PLUS PO) Take 1 tablet by mouth daily.     nitroGLYCERIN (NITROSTAT) 0.4 MG SL tablet Place 1 tablet (0.4 mg total) under the tongue every 5 (five) minutes as needed for chest pain. 30 tablet 1   OZEMPIC, 2 MG/DOSE, 8 MG/3ML SOPN Inject 2 mg into the skin every Tuesday.     pantoprazole (PROTONIX) 40 MG tablet Take 40 mg by mouth 2 (two) times daily.     pregabalin (LYRICA) 200 MG capsule Take 200 mg by mouth 3 (three) times daily.     QUEtiapine (SEROQUEL) 100 MG tablet Take 100 mg at bedtime by mouth.     rOPINIRole (REQUIP) 0.25 MG tablet Take 0.25 mg by mouth at bedtime.   5   tiotropium (SPIRIVA) 18 MCG inhalation capsule Place 18 mcg into inhaler and inhale daily.     VASCEPA 1 g capsule Take 1 g by mouth 2 (two) times daily.     zolpidem (AMBIEN) 10 MG tablet Take 5 mg by mouth at bedtime.     No current facility-administered medications for this visit.    Musculoskeletal: Strength & Muscle Tone: within normal  limits Gait & Station: normal Patient leans: N/A  Psychiatric Specialty Exam: Review of Systems  There were no vitals taken  for this visit.There is no height or weight on file to calculate BMI.  General Appearance: {Appearance:22683}  Eye Contact:  {BHH EYE CONTACT:22684}  Speech:  Clear and Coherent  Volume:  Normal  Mood:  {BHH MOOD:22306}  Affect:  {Affect (PAA):22687}  Thought Process:  Coherent  Orientation:  Full (Time, Place, and Person)  Thought Content:  Logical  Suicidal Thoughts:  {ST/HT (PAA):22692}  Homicidal Thoughts:  {ST/HT (PAA):22692}  Memory:  Immediate;   Good  Judgement:  {Judgement (PAA):22694}  Insight:  {Insight (PAA):22695}  Psychomotor Activity:  Normal  Concentration:  Concentration: Good and Attention Span: Good  Recall:  Good  Fund of Knowledge:Good  Language: Good  Akathisia:  No  Handed:  Right  AIMS (if indicated):  not done  Assets:  Communication Skills Desire for Improvement  ADL's:  Intact  Cognition: WNL  Sleep:  {BHH GOOD/FAIR/POOR:22877}   Screenings: Flowsheet Row Admission (Discharged) from 12/26/2021 in [facility] C[facility] Pre-Admission Testing 62 from 12/18/2021 in [facility]  C-SSRS RISK CATEGORY No Risk No Risk       Assessment and Plan:  Assessment  Plan   The patient demonstrates the following risk factors for suicide: Chronic risk factors for suicide include: {Chronic Risk Factors for XBJYNWG:95621308}. Acute risk factors for suicide include: {Acute Risk Factors for MVHQION:62952841}. Protective factors for this patient include: {Protective Factors for Suicide LKGM:01027253}. Considering these factors, the overall suicide risk at this point appears to be {Desc; low/moderate/high:110033}. Patient {ACTION; IS/IS GUY:40347425} appropriate for outpatient follow up.   Collaboration of Care: {BH OP Collaboration of Care:21014065}  Patient/Guardian was  advised Release of Information must be obtained prior to any record release in order to collaborate their care with an outside provider. Patient/Guardian was advised if they have not already done so to contact the registration department to sign all necessary forms in order for [place] to release information regarding their care.   Consent: Patient/Guardian gives verbal consent for treatment and assignment of benefits for services provided during this visit. Patient/Guardian expressed understanding and agreed to proceed.   Neysa Hotter, MD 5/27/20244:10 PM

## 2022-07-01 ENCOUNTER — Ambulatory Visit: Payer: Medicaid Other | Admitting: Psychiatry

## 2022-07-10 ENCOUNTER — Telehealth: Payer: Self-pay

## 2022-07-10 NOTE — Telephone Encounter (Signed)
   [facility] Pre-operative Risk Assessment    Request for surgical clearance:  What type of surgery is being performed? Right Total Hip Arthroplasty    When is this surgery scheduled? TBD   What type of clearance is required (medical clearance vs. Pharmacy clearance to hold med vs. Both)? Both  Are there any medications that need to be held prior to surgery and how long?Not specified   Practice name and name of physician performing surgery? Dr. Jodi Geralds   What is your office phone number: [phone]    7.   What is your office fax number: [phone]  8.   Anesthesia type (None, local, MAC, general) ? Spinal   [name] 07/10/2022, 5:03 PM  _________________________________________________________________   (provider comments below)

## 2022-07-11 ENCOUNTER — Telehealth: Payer: Self-pay | Admitting: *Deleted

## 2022-07-11 NOTE — Telephone Encounter (Signed)
Lvm for pt to contact office to schedule VT visit for pre op.

## 2022-07-11 NOTE — Telephone Encounter (Signed)
Patient was returning call. Please advise ?

## 2022-07-11 NOTE — Telephone Encounter (Signed)
  Patient Consent for Virtual Visit         [name] has provided verbal consent on 07/11/2022 for a virtual visit (video or telephone).   CONSENT FOR VIRTUAL VISIT FOR:  [name]  By participating in this virtual visit I agree to the following:  I hereby voluntarily request, consent and authorize [facility] and its employed or contracted physicians, physician assistants, nurse practitioners or other licensed health care professionals (the Practitioner), to provide me with telemedicine health care services (the "Services") as deemed necessary by the treating Practitioner. I acknowledge and consent to receive the Services by the Practitioner via telemedicine. I understand that the telemedicine visit will involve communicating with the Practitioner through live audiovisual communication technology and the disclosure of certain medical information by electronic transmission. I acknowledge that I have been given the opportunity to request an in-person assessment or other available alternative prior to the telemedicine visit and am voluntarily participating in the telemedicine visit.  I understand that I have the right to withhold or withdraw my consent to the use of telemedicine in the course of my care at any time, without affecting my right to future care or treatment, and that the Practitioner or I may terminate the telemedicine visit at any time. I understand that I have the right to inspect all information obtained and/or recorded in the course of the telemedicine visit and may receive copies of available information for a reasonable fee.  I understand that some of the potential risks of receiving the Services via telemedicine include:  Delay or interruption in medical evaluation due to technological equipment failure or disruption; Information transmitted may not be sufficient (e.g. poor resolution of images) to allow for appropriate medical decision  making by the Practitioner; and/or  In rare instances, security protocols could fail, causing a breach of personal health information.  Furthermore, I acknowledge that it is my responsibility to provide information about my medical history, conditions and care that is complete and accurate to the best of my ability. I acknowledge that Practitioner's advice, recommendations, and/or decision may be based on factors not within their control, such as incomplete or inaccurate data provided by me or distortions of diagnostic images or specimens that may result from electronic transmissions. I understand that the practice of medicine is not an exact science and that Practitioner makes no warranties or guarantees regarding treatment outcomes. I acknowledge that a copy of this consent can be made available to me via my patient portal [facility] MyChart), or I can request a printed copy by calling the office of [facility].    I understand that my insurance will be billed for this visit.   I have read or had this consent read to me. I understand the contents of this consent, which adequately explains the benefits and risks of the Services being provided via telemedicine.  I have been provided ample opportunity to ask questions regarding this consent and the Services and have had my questions answered to my satisfaction. I give my informed consent for the services to be provided through the use of telemedicine in my medical care

## 2022-07-11 NOTE — Telephone Encounter (Signed)
Primary Cardiologist:Robert Bing Matter, MD   Preoperative team, please contact this patient and set up a phone call appointment for further preoperative risk assessment. Please obtain consent and complete medication review. Thank you for your help.   Per Dr. Bing Matter on 06/19/22:  Minimum duration of dual antiplatelet therapy is for 6 months, however, if she required to keep surgery after 6 months dual antiplatelet therapy can be switched to monotherapy.  What she needs to do she is to talk to her orthopedic surgeon if arrangements will be made for her surgery I will continue dual platelet therapy until then 5 to 7 days before surgery dual antiplatelets therapy need to be switched to monotherapy   Levi Aland, NP-C  07/11/2022, 7:56 AM [address], [address] Office [phone] Fax [phone]

## 2022-07-21 ENCOUNTER — Ambulatory Visit: Payer: Medicaid Other | Attending: Cardiology

## 2022-07-21 DIAGNOSIS — Z01810 Encounter for preprocedural cardiovascular examination: Secondary | ICD-10-CM | POA: Diagnosis not present

## 2022-07-21 NOTE — Progress Notes (Signed)
Virtual Visit via Telephone Note   Because of [name] co-morbid illnesses, she is at least at moderate risk for complications without adequate follow up.  This format is felt to be most appropriate for this patient at this time.  The patient did not have access to video technology/had technical difficulties with video requiring transitioning to audio format only (telephone).  All issues noted in this document were discussed and addressed.  No physical exam could be performed with this format.  Please refer to the patient's chart for her consent to telehealth for [facility].  Evaluation Performed:  Preoperative cardiovascular risk assessment _____________   Date:  07/21/2022   Patient ID:  [name], DOB Mar 30, 1960, MRN [id number] Patient Location:  Home Provider location:   Office  Primary Care Provider:  Ellis Parents, FNP Primary Cardiologist:  Gypsy Balsam, MD  Chief Complaint / Patient Profile   62 y.o. y/o female with a h/o coronary artery disease, HTN, hyperlipidemia who is pending right total hip arthroplasty and presents today for telephonic preoperative cardiovascular risk assessment.  History of Present Illness    [name] is a 62 y.o. female who presents via audio/video conferencing for a telehealth visit today.  Pt was last seen in cardiology clinic on 04/03/22 by Dr. Bing Matter.  At that time [name] was doing well .  The patient is now pending procedure as outlined above. Since her last visit, she remained stable from a cardiac standpoint.  Today she denies chest pain, shortness of breath, lower extremity edema, fatigue, palpitations, melena, hematuria, hemoptysis, diaphoresis, weakness, presyncope, syncope, orthopnea, and PND.   Past Medical History    Past Medical History:  Diagnosis Date   Allergic rhinitis    Anxiety    Asthma    CAD (coronary artery disease)    a.  11/2021 MV: Ant ischemia. EF 52%; b. 11/2021 PCI: LAD 21m (2.25x20 Synergy XD DES), D2 60 (PTCA), LCX 30p/m, 34m/d, OM2 30, RCA large, 20p/d.   Chronic lower back pain    Chronic pain syndrome    followed by pain clinic [facility] in [place]   COPD (chronic obstructive pulmonary disease) (HCC)    DDD (degenerative disc disease), cervical    DDD (degenerative disc disease), lumbar    Depression    Diabetic peripheral neuropathy (HCC)    hands and feet   Essential (primary) hypertension    Fibromyalgia    Full dentures    only wears upper   GERD without esophagitis    History of 2019 novel coronavirus disease (COVID-19)    08-10-2018  per pt in May 2020,  recovered at home without supplemental oxygen, never admitted to hospital,  residual nasal congestion and loss of taste is coming back , no cough sore throat sob or difficultly breathing,  Negative covid test done 08-07-2018   History of pyelonephritis 09/2017   Hyperlipidemia    Hypothyroidism    IBS (irritable bowel syndrome)    no current med.   Left shoulder pain    Lumbar nerve root impingement    Lumbar post-laminectomy syndrome    Major depressive disorder    Obesity    OSA (obstructive sleep apnea)    08-10-2018  per pt has used in 8 months   Osteoarthritis    Polyneuropathy    PTSD (post-traumatic stress disorder)    Type II diabetes mellitus (HCC)    followed by pcp   Vitamin D deficiency    Wears glasses  Past Surgical History:  Procedure Laterality Date   BLADDER SUSPENSION  2006   CARPAL TUNNEL RELEASE Bilateral right 07/22/2001;  left 01-05-2003  dr Eulah Pont @[facility]    CHOLECYSTECTOMY     COLONOSCOPY W/ POLYPECTOMY     CORONARY PRESSURE/FFR STUDY N/A 12/26/2021   Procedure: INTRAVASCULAR PRESSURE WIRE/FFR STUDY;  Surgeon: Kathleene Hazel, MD;  Location: [facility];  Service: Cardiovascular;  Laterality: N/A;   CORONARY STENT INTERVENTION N/A 12/26/2021   Procedure: CORONARY STENT  INTERVENTION;  Surgeon: Kathleene Hazel, MD;  Location: [facility];  Service: Cardiovascular;  Laterality: N/A;   DILATION AND CURETTAGE OF UTERUS     LAPAROSCOPIC CHOLECYSTECTOMY  1992   LEFT HEART CATH AND CORONARY ANGIOGRAPHY N/A 12/26/2021   Procedure: LEFT HEART CATH AND CORONARY ANGIOGRAPHY;  Surgeon: Kathleene Hazel, MD;  Location: [facility];  Service: Cardiovascular;  Laterality: N/A;   LUMBAR LAMINECTOMY  1997   L4 --5   MULTIPLE EXTRACTIONS WITH ALVEOLOPLASTY N/A 11/26/2012   Procedure: MULTIPLE EXTRACTION WITH ALVEOLOPLASTY;  Surgeon: [name], DDS;  Location: [facility];  Service: Oral Surgery;  Laterality: N/A;   SHOULDER ARTHROSCOPY WITH ROTATOR CUFF REPAIR AND SUBACROMIAL DECOMPRESSION Left 08/11/2018   Procedure: SHOULDER ARTHROSCOPY WITH ROTATOR CUFF REPAIR AND SUBACROMIAL DECOMPRESSION and DISTAL CLAVICLE ,DEBRIDEMENT ANTERIOR LABRIL TEAR;  Surgeon: Jodi Geralds, MD;  Location: [facility];  Service: Orthopedics;  Laterality: Left;   TONSILLECTOMY  age 51   TOTAL HIP ARTHROPLASTY Left 07/10/2014   Procedure: TOTAL HIP ARTHROPLASTY ANTERIOR APPROACH;  Surgeon: Jodi Geralds, MD;  Location: [facility];  Service: Orthopedics;  Laterality: Left;   TRIGGER FINGER RELEASE Right 2018   thumb   TRIGGER FINGER RELEASE Left 10/22/2015   Procedure: LEFT TRIGGER THUMB RELEASE;  Surgeon: Mack Hook, MD;  Location: [facility];  Service: Orthopedics;  Laterality: Left;   TUBAL LIGATION  yrs ago   ULNAR NERVE TRANSPOSITION Right 03/27/2014   Procedure: RIGHT ULNAR NEUROPLASTY AT THE ELBOW;  Surgeon: Jodi Marble, MD;  Location: [facility];  Service: Orthopedics;  Laterality: Right;   ULNAR NERVE TRANSPOSITION Left 12/08/2016   Procedure: LEFT ULNAR NEUROPLASTY AT THE ELBOW;  Surgeon: Mack Hook, MD;  Location: [facility];  Service: Orthopedics;  Laterality: Left;   UPPER GASTROINTESTINAL  ENDOSCOPY     VAGINAL HYSTERECTOMY  2006   w/  ANTERIOR AND POSTERIOR REPAIR WITH SLING SUSPENSION    Allergies  Allergies  Allergen Reactions   Adhesive [Tape] Hives   Latex Hives   Dilaudid [Hydromorphone] Hives   Oxycodone Itching    Home Medications    Prior to Admission medications   Medication Sig Start Date End Date Taking? Authorizing Provider  acetaminophen (TYLENOL) 500 MG tablet Take 1,000 mg by mouth every 6 (six) hours as needed for moderate pain.    [provider]  albuterol (PROVENTIL HFA;VENTOLIN HFA) 108 (90 Base) MCG/ACT inhaler Inhale 2 puffs into the lungs every 6 (six) hours as needed for wheezing or shortness of breath.    [provider]  aspirin EC 81 MG tablet Take 1 tablet (81 mg total) by mouth daily. Swallow whole. 12/23/21   Georgeanna Lea, MD  atorvastatin (LIPITOR) 80 MG tablet Take 1 tablet (80 mg total) by mouth daily. 01/22/22 01/17/23  Georgeanna Lea, MD  Azelastine HCl 137 MCG/SPRAY SOLN Place 2 sprays into both nostrils daily. 07/15/17   [provider]  budesonide-formoterol (  SYMBICORT) 160-4.5 MCG/ACT inhaler Inhale 1 puff into the lungs daily.    [provider]  busPIRone (BUSPAR) 5 MG tablet Take 5 mg by mouth 2 (two) times daily. 03/20/22   [provider]  cyclobenzaprine (FLEXERIL) 10 MG tablet Take 1 tablet (10 mg total) by mouth 3 (three) times daily as needed for muscle spasms. 08/11/18   Marshia Ly, PA-C  FLUoxetine (PROZAC) 40 MG capsule Take 40 mg by mouth daily. 05/10/19   [provider]  fluticasone (FLONASE) 50 MCG/ACT nasal spray Place 2 sprays into both nostrils as needed for allergies. 07/26/19   [provider]  HYDROcodone-acetaminophen (NORCO) 10-325 MG tablet Take 1 tablet by mouth 2 (two) times daily as needed for moderate pain. 07/05/19   [provider]  hydrOXYzine (ATARAX) 25 MG tablet Take 25 mg by mouth as needed for anxiety. 05/27/21    [provider]  levothyroxine (SYNTHROID) 75 MCG tablet Take 75 mcg by mouth daily before breakfast.    [provider]  loratadine (CLARITIN) 10 MG tablet Take 10 mg by mouth daily as needed for allergies or rhinitis. 10/02/20   [provider]  morphine (MS CONTIN) 15 MG 12 hr tablet Take 15 mg by mouth 3 (three) times daily.    [provider]  Multiple Vitamins-Minerals (ONE A DAY WOMEN 50 PLUS PO) Take 1 tablet by mouth daily.    [provider]  nitroGLYCERIN (NITROSTAT) 0.4 MG SL tablet Place 1 tablet (0.4 mg total) under the tongue every 5 (five) minutes as needed for chest pain. 12/26/21 07/11/22  Perlie Gold, PA-C  OZEMPIC, 2 MG/DOSE, 8 MG/3ML SOPN Inject 0.5 mg into the skin every Tuesday. 09/27/21   [provider]  pregabalin (LYRICA) 200 MG capsule Take 200 mg by mouth 3 (three) times daily.    [provider]  QUEtiapine (SEROQUEL) 100 MG tablet Take 100 mg at bedtime by mouth.    [provider]  VASCEPA 1 g capsule Take 1 g by mouth 2 (two) times daily. 11/11/21   [provider]  zolpidem (AMBIEN) 10 MG tablet Take 5 mg by mouth at bedtime.    [provider]    Physical Exam    Vital Signs:  [name] does not have vital signs available for review today.  Given telephonic nature of communication, physical exam is limited. AAOx3. NAD. Normal affect.  Speech and respirations are unlabored.  Accessory Clinical Findings    None  Assessment & Plan    1.  Preoperative Cardiovascular Risk Assessment:Right Total Hip Arthroplasty , TBD, Dr. Jodi Geralds ,  fax number: [phone]       Primary Cardiologist: Gypsy Balsam, MD  Chart reviewed as part of pre-operative protocol coverage. Given past medical history and time since last visit, based on ACC/AHA guidelines, [name] would be at acceptable risk for the planned procedure without further  cardiovascular testing.   Her RCRI is a class II risk, 0.9% risk of major cardiac event.  She is able to complete greater than 4 METS of physical activity.  Per Dr. Bing Matter on 06/19/22:   Minimum duration of dual antiplatelet therapy is for 6 months, however, if she required to keep surgery after 6 months dual antiplatelet therapy can be switched to monotherapy.  What she needs to do she is to talk to her orthopedic surgeon if arrangements will be made for her surgery I will continue dual platelet therapy until then 5 to 7 days  before surgery dual antiplatelets therapy need to be switched to monotherapy   Patient was advised that if he develops new symptoms prior to surgery to contact our office to arrange a follow-up appointment.  He verbalized understanding.  I will route this recommendation to the requesting party via Epic fax function and remove from pre-op pool.       Time:   Today, I have spent 5 minutes with the patient with telehealth technology discussing medical history, symptoms, and management plan.  Prior to her phone evaluation I spent greater than 10 minutes reviewing her past medical history and cardiac medications.   Ronney Asters, NP  07/21/2022, 7:56 AM

## 2022-07-30 ENCOUNTER — Telehealth: Payer: Self-pay | Admitting: Cardiology

## 2022-07-30 NOTE — Telephone Encounter (Signed)
I read notes from 07/21/22 by Edd Fabian, FNP. 07/21/22 notes indicated a note per Dr. Bing Matter.  I did d/w pre op APP Lori Levering, NP and Dr. Bing Matter for clarification.   Per Dr. Bing Matter today: It depends how badly she needs to have the surgery. After 6 months dual antiplatelet therapy can be disrupted especially since intervention was done not for acute coronary event. Because of localization of the lesion which is LAD I prefer to continue for 12 months so if surgery is needed we can stop dual antiplatelet therapy continue monotherapy she can have surgery and then probably week later I would recommend to restart dual antiplatelet therapy.   I then called and left message for surgery scheduler [name] to call me back to discuss further. Once I d/w surgery scheduler to determine if surgery is of urgent matter, I will update the pre op APP and Dr. Bing Matter.    Per Dr. Bing Matter on 06/19/22:   Minimum duration of dual antiplatelet therapy is for 6 months, however, if she required to keep surgery after 6 months dual antiplatelet therapy can be switched to monotherapy.  What she needs to do she is to talk to her orthopedic surgeon if arrangements will be made for her surgery I will continue dual platelet therapy until then 5 to 7 days before surgery dual antiplatelets therapy need to be switched to monotherapy.

## 2022-07-30 NOTE — Telephone Encounter (Signed)
Follow Up:      Darel Hong is calling to check on the status of patient's clearance. Please fax to [phone]

## 2022-07-30 NOTE — Telephone Encounter (Signed)
I will forward this note to pre op APP to review if the pt has been cleared.

## 2022-08-01 NOTE — Telephone Encounter (Signed)
I read notes from 07/21/22 by Edd Fabian, FNP. 07/21/22 notes indicated a note per Dr. Bing Matter.  I did d/w pre op APP Carlos Levering, NP and Dr. Bing Matter for clarification.    Per Dr. Bing Matter today: It depends how badly she needs to have the surgery. After 6 months dual antiplatelet therapy can be disrupted especially since intervention was done not for acute coronary event. Because of localization of the lesion which is LAD I prefer to continue for 12 months so if surgery is needed we can stop dual antiplatelet therapy continue monotherapy she can have surgery and then probably week later I would recommend to restart dual antiplatelet therapy.    I then called and left message for surgery scheduler Darel Hong to call me back to discuss further. Once I d/w surgery scheduler to determine if surgery is of urgent matter, I will update the pre op APP and Dr. Bing Matter.      Per Dr. Bing Matter on 06/19/22:   Minimum duration of dual antiplatelet therapy is for 6 months, however, if she required to keep surgery after 6 months dual antiplatelet therapy can be switched to monotherapy.  What she needs to do she is to talk to her orthopedic surgeon if arrangements will be made for her surgery I will continue dual platelet therapy until then 5 to 7 days before surgery dual antiplatelets therapy need to be switched to monotherapy.           Note   You  [facility] [phone] days ago   You routed conversation to [employer] days ago   You2 days ago    I will forward this note to pre op APP to review if the pt has been cleared.       Note   Dalia Heading M routed conversation to Cv Div Preop Callback2 days ago   Anne Hahn, Nettie Elm M2 days ago   SW Follow Up:           Darel Hong is calling to check on the status of patient's clearance. Please fax to [phone]      Note   Thurmon Fair [phone]  Laurence Ferrari

## 2022-08-05 NOTE — Telephone Encounter (Signed)
Sharyl Nimrod who is with Dr. Luiz Blare office called in regard to clearance for the pt. Sharyl Nimrod states she is filling in for [facility] Dr. Luiz Blare surgery scheduler. I s/w Sharyl Nimrod and we have reviewed the notes per Dr. Verdis Frederickson previous notes. In short the conversation lead to a complete 12 months would be 12/27/2022. 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 Sharyl Nimrod states she is going to d/w Dr. Luiz Blare further, if maybe an alternate Tx plan until 11/2022 or if he feels the surgery needs to be done. We have complied that if the latter, (surgery needs to be done), she will reach out to our office, for re-assessment per Dr. Bing Matter if surgery is urgent.   I will fax these notes to Dr. Luiz Blare and Sharyl Nimrod as Lorain Childes.

## 2022-08-06 NOTE — Telephone Encounter (Signed)
Patient called stating [facility] told her that Dr. Bing Matter said she would need to stay on Plavix.  Patient states she stopped talking Plavix two months ago like her told her to. She called stating they just need clearance that she is okay for surgery.

## 2022-08-06 NOTE — Telephone Encounter (Signed)
I s/w Meredith yesterday with Dr. Luiz Blare office. See my notes from below that I have copied in from yesterday. Sharyl Nimrod is going to d/w Dr. Luiz Blare further as per the recommendation from Dr. Bing Matter that I reviewed with [facility] yesterday.   I will ask Sharyl Nimrod if she may reach out to the pt and let her know of our conversation yesterday as well as the recommendations per Dr. Bing Matter.    Sharyl Nimrod who is with Dr. Luiz Blare office called in regard to clearance for the pt. Sharyl Nimrod states she is filling in for [facility] Dr. Luiz Blare surgery scheduler. I s/w Sharyl Nimrod and we have reviewed the notes per Dr. Verdis Frederickson previous notes. In short the conversation lead to a complete 12 months would be 12/27/2022. 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 Sharyl Nimrod states she is going to d/w Dr. Luiz Blare further, if maybe an alternate Tx plan until 11/2022 or if he feels the surgery needs to be done. We have complied that if the latter, (surgery needs to be done), she will reach out to our office, for re-assessment per Dr. Bing Matter if surgery is urgent.    I will fax these notes to Dr. Luiz Blare and Sharyl Nimrod as Lorain Childes.

## 2022-08-12 ENCOUNTER — Telehealth: Payer: Self-pay

## 2022-08-12 NOTE — Telephone Encounter (Signed)
Spoke with pt per Dr. Bing Matter stated that she can remain off the Plavix now and is ok to have surgery. Pt verbalized understanding and had no further questions.

## 2022-09-11 ENCOUNTER — Telehealth: Payer: Self-pay | Admitting: Cardiology

## 2022-09-11 NOTE — Telephone Encounter (Signed)
Wilson, Lori B14 minutes ago (3:45 PM)   JW Patient called to follow-up on the status of her clearance.      Note   Hernandez-Morales, [place] [phone]  Andrey Campanile, [name]     SEE NOTES BELOW PER DR. Bing Matter OK TO PROCEED AND OK TO REMAIN OFF PLAVIX UNTIL AFTER SURGERY.     August 12, 2022 Lori Rhymes, RN      08/12/22  5:05 PM Note Spoke with pt per Dr. Bing Matter stated that she can remain off the Plavix now and is ok to have surgery. Pt verbalized understanding and had no further questions.

## 2022-09-11 NOTE — Telephone Encounter (Signed)
Patient called to follow-up on the status of her clearance.

## 2022-09-11 NOTE — Telephone Encounter (Signed)
D/W PRE OP APP JESSE CLEAVER, FNP WHO GAVE THE OK TO FAX NOTES TO DR. GRAVES OFFICE. SEE NOTES BELOW FROM 08/12/22.          Wilson, Jasmin B14 minutes ago (3:45 PM)    JW Patient called to follow-up on the status of her clearance.       Note    Hernandez-Morales, [place] [phone]  Andrey Campanile, Jasmin B        SEE NOTES BELOW PER DR. Bing Matter OK TO PROCEED AND OK TO REMAIN OFF PLAVIX UNTIL AFTER SURGERY.        August 12, 2022 Neena Rhymes, RN       08/12/22  5:05 PM Note Spoke with pt per Dr. Bing Matter stated that she can remain off the Plavix now and is ok to have surgery. Pt verbalized understanding and had no further questions.            August 06, 2022 Me      08/06/22 12:18 PM Note I s/w Sharyl Nimrod yesterday with Dr. Luiz Blare office. See my notes from below that I have copied in from yesterday. Sharyl Nimrod is going to d/w Dr. Luiz Blare further as per the recommendation from Dr. Bing Matter that I reviewed with [facility] yesterday.    I will ask Sharyl Nimrod if she may reach out to the pt and let her know of our conversation yesterday as well as the recommendations per Dr. Bing Matter.      Sharyl Nimrod who is with Dr. Luiz Blare office called in regard to clearance for the pt. Sharyl Nimrod states she is filling in for [facility] Dr. Luiz Blare surgery scheduler. I s/w Sharyl Nimrod and we have reviewed the notes per Dr. Verdis Frederickson previous notes. In short the conversation lead to a complete 12 months would be 12/27/2022. 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 Sharyl Nimrod states she is going to d/w Dr. Luiz Blare further, if maybe an alternate Tx plan until 11/2022 or if he feels the surgery needs to be done. We have complied that if the latter, (surgery needs to be done), she will reach out to our office, for re-assessment per Dr. Bing Matter if surgery is urgent.    I will fax these notes to Dr. Luiz Blare and Sharyl Nimrod as Lorain Childes.               YO   08/06/22 12:00  PM Lorie Phenix routed this conversation to Cv Div Preop Callback    Lorie Phenix   YO   08/06/22 12:00 PM Note Patient called stating [facility] told her that Dr. Bing Matter said she would need to stay on Plavix.  Patient states she stopped talking Plavix two months ago like her told her to. She called stating they just need clearance that she is okay for surgery.              08/06/22 11:57 AM Hernandez-Morales, Cala Bradford contacted Lorie Phenix   July 30, 2022        07/30/22  1:14 PM You routed this conversation to Cv Div Preop Callback         07/30/22  1:13 PM You routed this conversation to Cv Div Preop   Me      07/30/22  1:12 PM Note I read notes from 07/21/22 by Edd Fabian, FNP. 07/21/22 notes indicated a note per Dr. Bing Matter.  I did d/w pre op APP Carlos Levering, NP and Dr. Bing Matter for clarification.    Per Dr. Bing Matter  today: It depends how badly she needs to have the surgery. After 6 months dual antiplatelet therapy can be disrupted especially since intervention was done not for acute coronary event. Because of localization of the lesion which is LAD I prefer to continue for 12 months so if surgery is needed we can stop dual antiplatelet therapy continue monotherapy she can have surgery and then probably week later I would recommend to restart dual antiplatelet therapy.    I then called and left message for surgery scheduler Darel Hong to call me back to discuss further. Once I d/w surgery scheduler to determine if surgery is of urgent matter, I will update the pre op APP and Dr. Bing Matter.      Per Dr. Bing Matter on 06/19/22:   Minimum duration of dual antiplatelet therapy is for 6 months, however, if she required to keep surgery after 6 months dual antiplatelet therapy can be switched to monotherapy.  What she needs to do she is to talk to her orthopedic surgeon if arrangements will be made for her surgery I will continue dual platelet therapy  until then 5 to 7 days before surgery dual antiplatelets therapy need to be switched to monotherapy.

## 2022-10-01 ENCOUNTER — Other Ambulatory Visit: Payer: Self-pay | Admitting: Orthopedic Surgery

## 2022-10-03 NOTE — Progress Notes (Signed)
Sent message, via epic in basket, requesting orders in epic from surgeon.  

## 2022-10-06 NOTE — Patient Instructions (Signed)
SURGICAL WAITING ROOM VISITATION  Patients having surgery or a procedure may have no more than 2 support people in the waiting area - these visitors may rotate.    Children under the age of 36 must have an adult with them who is not the patient.  Due to an increase in RSV and influenza rates and associated hospitalizations, children ages 4 and under may not visit patients in [facility] hospitals.  If the patient needs to stay at the hospital during part of their recovery, the visitor guidelines for inpatient rooms apply. Pre-op nurse will coordinate an appropriate time for 1 support person to accompany patient in pre-op.  This support person may not rotate.    Please refer to the [facility] website for the visitor guidelines for Inpatients (after your surgery is over and you are in a regular room).       Your procedure is scheduled on: 10/20/22   Report to [facility] Main Entrance    Report to admitting at  5:15 AM   Call this number if you have problems the morning of surgery [phone]   Do not eat food :After Midnight.   After Midnight you may have the following liquids until 4:15 am DAY OF SURGERY  Water Non-Citrus Juices (without pulp, NO RED-Apple, White grape, White cranberry) Black Coffee (NO MILK/CREAM OR CREAMERS, sugar ok)  Clear Tea (NO MILK/CREAM OR CREAMERS, sugar ok) regular and decaf                             Plain Jell-O (NO RED)                                           Fruit ices (not with fruit pulp, NO RED)                                     Popsicles (NO RED)                                                               Sports drinks like Gatorade (NO RED)                The day of surgery:  Drink ONE (1) Pre-Surgery G2 at 4:15 AM the morning of surgery. Drink in one sitting. Do not sip.  This drink was given to you during your hospital  pre-op appointment visit. Nothing else to drink after completing the  Pre-Surgery  G2.     Oral  Hygiene is also important to reduce your risk of infection.                                    Remember - BRUSH YOUR TEETH THE MORNING OF SURGERY WITH YOUR REGULAR TOOTHPASTE  DENTURES WILL BE REMOVED PRIOR TO SURGERY PLEASE DO NOT APPLY "Poly grip" OR ADHESIVES!!!   Stop all vitamins and herbal supplements 7 days before surgery.   Take these medicines the morning of  surgery; ATORVASTATIN, CLONAZEPAM, FLUOXETINE, LEVOTHYROXINE, NORCO or Tyleno IF NEEDed   DO NOT TAKE ANY ORAL DIABETIC MEDICATIONS DAY OF YOUR SURGERY: HOLD GLIPIZIDE THE MORNING OF SURGERY  Bring CPAP mask and tubing day of surgery.                              You may not have any metal on your body including hair pins, jewelry, and body piercing             Do not wear make-up, lotions, powders, perfumes/cologne, or deodorant  Do not wear nail polish including gel and S&S, artificial/acrylic nails, or any other type of covering on natural nails including finger and toenails. If you have artificial nails, gel coating, etc. that needs to be removed by a nail salon please have this removed prior to surgery or surgery may need to be canceled/ delayed if the surgeon/ anesthesia feels like they are unable to be safely monitored.   Do not shave  48 hours prior to surgery.    Do not bring valuables to the hospital. [facility] IS NOT             RESPONSIBLE   FOR VALUABLES.   Contacts, glasses, dentures or bridgework may not be worn into surgery.  DO NOT BRING YOUR HOME MEDICATIONS TO THE HOSPITAL. PHARMACY WILL DISPENSE MEDICATIONS LISTED ON YOUR MEDICATION LIST TO YOU DURING YOUR ADMISSION IN THE HOSPITAL!    Patients discharged on the day of surgery will not be allowed to drive home.  Someone NEEDS to stay with you for the first 24 hours after anesthesia.   Special Instructions: Bring a copy of your healthcare power of attorney and living will documents the day of surgery if you haven't scanned them before.               Please read over the following fact sheets you were given: IF YOU HAVE QUESTIONS ABOUT YOUR PRE-OP INSTRUCTIONS PLEASE CALL [phone] [facility]   If you received a COVID test during your pre-op visit  it is requested that you wear a mask when out in public, stay away from anyone that may not be feeling well and notify your surgeon if you develop symptoms. If you test positive for Covid or have been in contact with anyone that has tested positive in the last 10 days please notify you surgeon.      Pre-operative 5 CHG Bath Instructions   You can play a key role in reducing the risk of infection after surgery. Your skin needs to be as free of germs as possible. You can reduce the number of germs on your skin by washing with CHG (chlorhexidine gluconate) soap before surgery. CHG is an antiseptic soap that kills germs and continues to kill germs even after washing.   DO NOT use if you have an allergy to chlorhexidine/CHG or antibacterial soaps. If your skin becomes reddened or irritated, stop using the CHG and notify one of our RNs at [phone].   Please shower with the CHG soap starting 4 days before surgery using the following schedule:     Please keep in mind the following:  DO NOT shave, including legs and underarms, starting the day of your first shower.   You may shave your face at any point before/day of surgery.  Place clean sheets on your bed the day you start using CHG soap. Use a clean washcloth (not used  since being washed) for each shower. DO NOT sleep with pets once you start using the CHG.   CHG Shower Instructions:  If you choose to wash your hair and private area, wash first with your normal shampoo/soap.  After you use shampoo/soap, rinse your hair and body thoroughly to remove shampoo/soap residue.  Turn the water OFF and apply about 3 tablespoons (45 ml) of CHG soap to a CLEAN washcloth.  Apply CHG soap ONLY FROM YOUR NECK DOWN TO YOUR TOES (washing for 3-5 minutes)  DO  NOT use CHG soap on face, private areas, open wounds, or sores.  Pay special attention to the area where your surgery is being performed.  If you are having back surgery, having someone wash your back for you may be helpful. Wait 2 minutes after CHG soap is applied, then you may rinse off the CHG soap.  Pat dry with a clean towel  Put on clean clothes/pajamas   If you choose to wear lotion, please use ONLY the CHG-compatible lotions on the back of this paper.     Additional instructions for the day of surgery: DO NOT APPLY any lotions, deodorants, cologne, or perfumes.   Put on clean/comfortable clothes.  Brush your teeth.  Ask your nurse before applying any prescription medications to the skin.      CHG Compatible Lotions   Aveeno Moisturizing lotion  Cetaphil Moisturizing Cream  Cetaphil Moisturizing Lotion  Clairol Herbal Essence Moisturizing Lotion, Dry Skin  Clairol Herbal Essence Moisturizing Lotion, Extra Dry Skin  Clairol Herbal Essence Moisturizing Lotion, Normal Skin  Curel Age Defying Therapeutic Moisturizing Lotion with Alpha Hydroxy  Curel Extreme Care Body Lotion  Curel Soothing Hands Moisturizing Hand Lotion  Curel Therapeutic Moisturizing Cream, Fragrance-Free  Curel Therapeutic Moisturizing Lotion, Fragrance-Free  Curel Therapeutic Moisturizing Lotion, Original Formula  Eucerin Daily Replenishing Lotion  Eucerin Dry Skin Therapy Plus Alpha Hydroxy Crme  Eucerin Dry Skin Therapy Plus Alpha Hydroxy Lotion  Eucerin Original Crme  Eucerin Original Lotion  Eucerin Plus Crme Eucerin Plus Lotion  Eucerin TriLipid Replenishing Lotion  Keri Anti-Bacterial Hand Lotion  Keri Deep Conditioning Original Lotion Dry Skin Formula Softly Scented  Keri Deep Conditioning Original Lotion, Fragrance Free Sensitive Skin Formula  Keri Lotion Fast Absorbing Fragrance Free Sensitive Skin Formula  Keri Lotion Fast Absorbing Softly Scented Dry Skin Formula  Keri Original Lotion   Keri Skin Renewal Lotion Keri Silky Smooth Lotion  Keri Silky Smooth Sensitive Skin Lotion  Nivea Body Creamy Conditioning Oil  Nivea Body Extra Enriched Lotion  Nivea Body Original Lotion  Nivea Body Sheer Moisturizing Lotion Nivea Crme  Nivea Skin Firming Lotion  NutraDerm 30 Skin Lotion  NutraDerm Skin Lotion  NutraDerm Therapeutic Skin Cream  NutraDerm Therapeutic Skin Lotion  ProShield Protective Hand Cream    How to Manage Your Diabetes Before and After Surgery  Why is it important to control my blood sugar before and after surgery? Improving blood sugar levels before and after surgery helps healing and can limit problems. A way of improving blood sugar control is eating a healthy diet by:  Eating less sugar and carbohydrates  Increasing activity/exercise  Talking with your doctor about reaching your blood sugar goals High blood sugars (greater than 180 mg/dL) can raise your risk of infections and slow your recovery, so you will need to focus on controlling your diabetes during the weeks before surgery. Make sure that the doctor who takes care of your diabetes knows about your planned surgery including the  date and location.  How do I manage my blood sugar before surgery? Check your blood sugar at least 4 times a day, starting 2 days before surgery, to make sure that the level is not too high or low. Check your blood sugar the morning of your surgery when you wake up and every 2 hours until you get to the Short Stay unit. If your blood sugar is less than 70 mg/dL, you will need to treat for low blood sugar: Do not take insulin. Treat a low blood sugar (less than 70 mg/dL) with  cup of clear juice (cranberry or apple), 4 glucose tablets, OR glucose gel. Recheck blood sugar in 15 minutes after treatment (to make sure it is greater than 70 mg/dL). If your blood sugar is not greater than 70 mg/dL on recheck, call [phone] for further instructions. Report your blood sugar to  the short stay nurse when you get to Short Stay.  If you are admitted to the hospital after surgery: Your blood sugar will be checked by the staff and you will probably be given insulin after surgery (instead of oral diabetes medicines) to make sure you have good blood sugar levels. The goal for blood sugar control after surgery is 80-180 mg/dL.   WHAT DO I DO ABOUT MY DIABETES MEDICATION?  Do not take oral diabetes medicines (pills) the morning of surgery. HOLD GLIPIZIDE   DO NOT TAKE THE FOLLOWING 7 DAYS PRIOR TO SURGERY: Ozempic,  LAST DOSE WILL BE 10/07/22 Wegovy, Rybelsus (Semaglutide), Byetta (exenatide), Bydureon (exenatide ER), Victoza, Saxenda (liraglutide), or Trulicity (dulaglutide) Mounjaro (Tirzepatide) Adlyxin (Lixisenatide), Polyethylene Glycol Loxenatide.  Patient Signature:  Date:   Nurse Signature:  Date:     Reviewed and Endorsed by [facility] Patient Education Committee, August 2015 WHAT IS A BLOOD TRANSFUSION? Blood Transfusion Information  A transfusion is the replacement of blood or some of its parts. Blood is made up of multiple cells which provide different functions. Red blood cells carry oxygen and are used for blood loss replacement. White blood cells fight against infection. Platelets control bleeding. Plasma helps clot blood. Other blood products are available for specialized needs, such as hemophilia or other clotting disorders. BEFORE THE TRANSFUSION  Who gives blood for transfusions?  Healthy volunteers who are fully evaluated to make sure their blood is safe. This is blood bank blood. Transfusion therapy is the safest it has ever been in the practice of medicine. Before blood is taken from a donor, a complete history is taken to make sure that person has no history of diseases nor engages in risky social behavior (examples are intravenous drug use or sexual activity with multiple partners). The donor's travel history is screened to minimize risk of  transmitting infections, such as malaria. The donated blood is tested for signs of infectious diseases, such as HIV and hepatitis. The blood is then tested to be sure it is compatible with you in order to minimize the chance of a transfusion reaction. If you or a relative donates blood, this is often done in anticipation of surgery and is not appropriate for emergency situations. It takes many days to process the donated blood. RISKS AND COMPLICATIONS Although transfusion therapy is very safe and saves many lives, the main dangers of transfusion include:  Getting an infectious disease. Developing a transfusion reaction. This is an allergic reaction to something in the blood you were given. Every precaution is taken to prevent this. The decision to have a blood transfusion has been considered  carefully by your caregiver before blood is given. Blood is not given unless the benefits outweigh the risks. AFTER THE TRANSFUSION Right after receiving a blood transfusion, you will usually feel much better and more energetic. This is especially true if your red blood cells have gotten low (anemic). The transfusion raises the level of the red blood cells which carry oxygen, and this usually causes an energy increase. The nurse administering the transfusion will monitor you carefully for complications. HOME CARE INSTRUCTIONS  No special instructions are needed after a transfusion. You may find your energy is better. Speak with your caregiver about any limitations on activity for underlying diseases you may have. SEEK MEDICAL CARE IF:  Your condition is not improving after your transfusion. You develop redness or irritation at the intravenous (IV) site. SEEK IMMEDIATE MEDICAL CARE IF:  Any of the following symptoms occur over the next 12 hours: Shaking chills. You have a temperature by mouth above 102 F (38.9 C), not controlled by medicine. Chest, back, or muscle pain. People around you feel you are not  acting correctly or are confused. Shortness of breath or difficulty breathing. Dizziness and fainting. You get a rash or develop hives. You have a decrease in urine output. Your urine turns a dark color or changes to pink, red, or brown. Any of the following symptoms occur over the next 10 days: You have a temperature by mouth above 102 F (38.9 C), not controlled by medicine. Shortness of breath. Weakness after normal activity. The white part of the eye turns yellow (jaundice). You have a decrease in the amount of urine or are urinating less often. Your urine turns a dark color or changes to pink, red, or brown. Document Released: 01/11/2000 Document Revised: 04/07/2011 Document Reviewed: 08/30/2007   .Incentive Spirometer (Watch this video at home: [email or web address])  An incentive spirometer is a tool that can help keep your lungs clear and active. This tool measures how well you are filling your lungs with each breath. Taking long deep breaths may help reverse or decrease the chance of developing breathing (pulmonary) problems (especially infection) following: A long period of time when you are unable to move or be active. BEFORE THE PROCEDURE  If the spirometer includes an indicator to show your best effort, your nurse or respiratory therapist will set it to a desired goal. If possible, sit up straight or lean slightly forward. Try not to slouch. Hold the incentive spirometer in an upright position. INSTRUCTIONS FOR USE  Sit on the edge of your bed if possible, or sit up as far as you can in bed or on a chair. Hold the incentive spirometer in an upright position. Breathe out normally. Place the mouthpiece in your mouth and seal your lips tightly around it. Breathe in slowly and as deeply as possible, raising the piston or the ball toward the top of the column. Hold your breath for 3-5 seconds or for as long as possible. Allow the piston or ball to fall to  the bottom of the column. Remove the mouthpiece from your mouth and breathe out normally. Rest for a few seconds and repeat Steps 1 through 7 at least 10 times every 1-2 hours when you are awake. Take your time and take a few normal breaths between deep breaths. The spirometer may include an indicator to show your best effort. Use the indicator as a goal to work toward during each repetition. After each set of 10 deep breaths, practice coughing to be sure your  lungs are clear. If you have an incision (the cut made at the time of surgery), support your incision when coughing by placing a pillow or rolled up towels firmly against it. Once you are able to get out of bed, walk around indoors and cough well. You may stop using the incentive spirometer when instructed by your caregiver.  RISKS AND COMPLICATIONS Take your time so you do not get dizzy or light-headed. If you are in pain, you may need to take or ask for pain medication before doing incentive spirometry. It is harder to take a deep breath if you are having pain. AFTER USE Rest and breathe slowly and easily. It can be helpful to keep track of a log of your progress. Your caregiver can provide you with a simple table to help with this. If you are using the spirometer at home, follow these instructions: SEEK MEDICAL CARE IF:  You are having difficultly using the spirometer. You have trouble using the spirometer as often as instructed. Your pain medication is not giving enough relief while using the spirometer. You develop fever of 100.5 F (38.1 C) or higher. SEEK IMMEDIATE MEDICAL CARE IF:  You cough up bloody sputum that had not been present before. You develop fever of 102 F (38.9 C) or greater. You develop worsening pain at or near the incision site. MAKE SURE YOU:  Understand these instructions. Will watch your condition. Will get help right away if you are not doing well or get worse. Document Released: 05/26/2006 Document  Revised: 04/07/2011 Document Reviewed: 07/27/2006 [facility] Patient Information 2014 [place], [place].

## 2022-10-06 NOTE — Progress Notes (Addendum)
COVID Vaccine received:  []  No [x]  Yes Date of any COVID positive Test in last 90 days: No PCP - Abe People FNP Cardiologist - Gypsy Balsam MD  Cardiac Clearance- Dr. Molly Maduro Krasowski-07/21/22  Chest x-ray - 12/18/21 Epic EKG -  01/03/22 Epic Stress Test - 12/17/21 Epic ECHO -  Cardiac Cath - 12/26/21 Epic  Bowel Prep - [x]  No  []   Yes ______  Pacemaker / ICD device [x]  No []  Yes   Spinal Cord Stimulator:[x]  No []  Yes       History of Sleep Apnea? []  No [x]  Yes   CPAP used?- [x]  No []  Yes    Does the patient monitor blood sugar?          [x]  No []  Yes  []  N/A  Patient has: []  NO Hx DM   []  Pre-DM                 []  DM1  [x]   DM2 Does patient have a [employer] or Dexacom? []  No []  Yes   Fasting Blood Sugar Ranges-  Checks Blood Sugar _____ times a day  GLP1 agonist / usual dose - Ozempic- Tuesdays. Last dose will be 10/07/22 GLP1 instructions:  SGLT-2 inhibitors / usual dose -  SGLT-2 instructions:   Blood Thinner / Instructions:Has been off Plavix since MAy Aspirin Instructions:81 mg ASA  Comments:   Activity level: Patient is able to climb a flight of stairs without difficulty; [x]  No CP  [x]  No SOB, but would have __hip pain_   Patient can perform ADLs without assistance.   Anesthesia review:   Patient denies shortness of breath, fever, cough and chest pain at PAT appointment.  Patient verbalized understanding and agreement to the Pre-Surgical Instructions that were given to them at this PAT appointment. Patient was also educated of the need to review these PAT instructions again prior to his/her surgery.I reviewed the appropriate phone numbers to call if they have any and questions or concerns.

## 2022-10-07 ENCOUNTER — Encounter (HOSPITAL_COMMUNITY)
Admission: RE | Admit: 2022-10-07 | Discharge: 2022-10-07 | Disposition: A | Discharge status: Home / Self Care | Payer: Medicaid Other | Source: Ambulatory Visit | Attending: Orthopedic Surgery | Admitting: Orthopedic Surgery

## 2022-10-07 ENCOUNTER — Ambulatory Visit (HOSPITAL_COMMUNITY)
Admission: RE | Admit: 2022-10-07 | Discharge: 2022-10-07 | Disposition: A | Discharge status: Home / Self Care | Payer: Medicaid Other | Source: Ambulatory Visit | Attending: Orthopedic Surgery | Admitting: Orthopedic Surgery

## 2022-10-07 ENCOUNTER — Encounter (HOSPITAL_COMMUNITY): Payer: Self-pay

## 2022-10-07 ENCOUNTER — Other Ambulatory Visit: Payer: Self-pay

## 2022-10-07 VITALS — BP 120/84 | HR 76 | Temp 98.2°F | Resp 16 | Ht 66.0 in | Wt 216.0 lb

## 2022-10-07 DIAGNOSIS — Z01818 Encounter for other preprocedural examination: Secondary | ICD-10-CM | POA: Diagnosis present

## 2022-10-07 DIAGNOSIS — E119 Type 2 diabetes mellitus without complications: Secondary | ICD-10-CM | POA: Insufficient documentation

## 2022-10-07 DIAGNOSIS — I10 Essential (primary) hypertension: Secondary | ICD-10-CM | POA: Diagnosis not present

## 2022-10-07 LAB — TYPE AND SCREEN
ABO/RH(D): O NEG
Antibody Screen: NEGATIVE

## 2022-10-07 LAB — BASIC METABOLIC PANEL
Anion gap: 12 (ref 5–15)
BUN: 12 mg/dL (ref 6–20)
CO2: 27 mmol/L (ref 22–32)
Calcium: 9.7 mg/dL (ref 8.9–10.3)
Chloride: 102 mmol/L (ref 98–111)
Creatinine, Ser: 0.72 mg/dL (ref 0.44–1.00)
GFR, Estimated: >60 mL/min (ref >60)
Glucose, Bld: 112 mg/dL — ABNORMAL HIGH (ref 70–99)
Potassium: 4.2 mmol/L (ref 3.5–5.1)
Sodium: 141 mmol/L (ref 135–145)

## 2022-10-07 LAB — CBC
HCT: 39.9 % (ref 36.0–46.0)
Hemoglobin: 12.5 g/dL (ref 12.0–15.0)
MCH: 28 pg (ref 26.0–34.0)
MCHC: 31.3 g/dL (ref 30.0–36.0)
MCV: 89.5 fL (ref 80.0–100.0)
Platelets: 201 10*3/uL (ref 150–400)
RBC: 4.46 MIL/uL (ref 3.87–5.11)
RDW: 13.6 % (ref 11.5–15.5)
WBC: 6.3 10*3/uL (ref 4.0–10.5)
nRBC: 0 % (ref 0.0–0.2)

## 2022-10-07 LAB — GLUCOSE, CAPILLARY: Glucose-Capillary: 119 mg/dL — ABNORMAL HIGH (ref 70–99)

## 2022-10-08 LAB — SURGICAL PCR SCREEN
MRSA, PCR: POSITIVE — AB
Staphylococcus aureus: POSITIVE — AB

## 2022-10-08 NOTE — Progress Notes (Signed)
10/07/22 PCR results: MRSA and Staph positive faxed via epic to Dr. Luiz Blare and Francia Greaves. Documented in [facility] and on patient's chart for Short Stay.

## 2022-10-13 NOTE — Anesthesia Preprocedure Evaluation (Addendum)
Anesthesia Evaluation  Patient identified by MRN, date of birth, ID band Patient awake    Reviewed: Allergy & Precautions, H&P , NPO status , Patient's Chart, lab work & pertinent test results  Airway Mallampati: III  TM Distance: >3 FB Neck ROM: Full    Dental  (+) Dental Advisory Given, Edentulous Upper, Edentulous Lower   Pulmonary asthma , sleep apnea and Continuous Positive Airway Pressure Ventilation , COPD,  COPD inhaler, former smoker   Pulmonary exam normal breath sounds clear to auscultation       Cardiovascular hypertension, Pt. on medications (-) angina + CAD and + Cardiac Stents  Normal cardiovascular exam Rhythm:Regular Rate:Normal  Cath 11/2021   Prox RCA to Dist RCA lesion is 20% stenosed.   Prox Cx to Mid Cx lesion is 30% stenosed.   2nd Mrg lesion is 30% stenosed.   Mid Cx to Dist Cx lesion is 40% stenosed.   Mid LAD lesion is 90% stenosed.   2nd Diag lesion is 60% stenosed.   A drug-eluting stent was successfully placed using a SYNERGY XD 2.25X20.   Balloon angioplasty was performed using a BALLN EMERGE MR 2.0X12.   Post intervention, there is a 50% residual stenosis.   Post intervention, there is a 0% residual stenosis.   Severe, calcified mid LAD stenosis. Moderate ostial diagonal stenosis arising just above the LAD lesion.  Successful PTCA/DES x 1 mid LAD Successful PTCA with balloon angioplasty ostium of the Diagonal branch Mild to moderate Circumflex/OM disease The RCA is heavily calcified throughout with minimal obstructive disease.  Normal LV filling pressures   Recommendations: Same day post PCI discharge. DAPT with ASA and Plavix for at least six months. Continue statin. Smoking cessation is advised.    Echo 08/2021 LVSF normal (EF 55-60%), trace TR and MR   Neuro/Psych  PSYCHIATRIC DISORDERS Anxiety Depression       GI/Hepatic Neg liver ROS,GERD  ,,  Endo/Other  diabetes, Insulin  Dependent, Oral Hypoglycemic AgentsHypothyroidism  Morbid obesity  Renal/GU Renal disease     Musculoskeletal  (+) Arthritis ,  Fibromyalgia -  Abdominal  (+) + obese  Peds  Hematology negative hematology ROS (+)   Anesthesia Other Findings   Reproductive/Obstetrics negative OB ROS                             Anesthesia Physical Anesthesia Plan  ASA: 3  Anesthesia Plan: Spinal   Post-op Pain Management:  Regional for Post-op pain and Tylenol PO (pre-op)*   Induction: Intravenous  PONV Risk Score and Plan: 3 and Ondansetron, Dexamethasone, Midazolam and Treatment may vary due to age or medical condition  Airway Management Planned:   Additional Equipment:   Intra-op Plan:   Post-operative Plan:   Informed Consent: I have reviewed the patients History and Physical, chart, labs and discussed the procedure including the risks, benefits and alternatives for the proposed anesthesia with the patient or authorized representative who has indicated his/her understanding and acceptance.     Dental advisory given  Plan Discussed with: CRNA  Anesthesia Plan Comments: (See PAT note 10/07/2022)        Anesthesia Quick Evaluation

## 2022-10-13 NOTE — Progress Notes (Signed)
Anesthesia Chart Review   Case: [id number] Date/Time: 10/20/22 0700   Procedure: TOTAL HIP ARTHROPLASTY ANTERIOR APPROACH (Right: Hip)   Anesthesia type: Spinal   Pre-op diagnosis: RIGHT HIP DEGENERATIVE JOINT DISEASE   Location: [facility] / [facility]   Surgeons: Jodi Geralds, MD       DISCUSSION:62 y.o. former smoker with h/o COPD, OSA, DM II, CAD s/p DES 11/2021, right hip djd scheduled for above procedure 10/20/22 with Dr. Jodi Geralds.   Per cardiology preoperative evaluation 07/21/2022, "Chart reviewed as part of pre-operative protocol coverage. Given past medical history and time since last visit, based on ACC/AHA guidelines, Phoebie Fudge would be at acceptable risk for the planned procedure without further cardiovascular testing.    Her RCRI is a class II risk, 0.9% risk of major cardiac event.  She is able to complete greater than 4 METS of physical activity.   Per Dr. Bing Matter on 06/19/22: Minimum duration of dual antiplatelet therapy is for 6 months, however, if she required to keep surgery after 6 months dual antiplatelet therapy can be switched to monotherapy.  What she needs to do she is to talk to her orthopedic surgeon if arrangements will be made for her surgery I will continue dual platelet therapy until then 5 to 7 days before surgery dual antiplatelets therapy need to be switched to monotherapy "  Pt is not off Plavix, per Dr. [place] Blas note 08/12/22 ok to have surgery.  VS: BP 120/84   Pulse 76   Temp 36.8 C (Oral)   Resp 16   Ht 5\' 6"  (1.676 m)   Wt 98 kg   SpO2 98%   BMI 34.86 kg/m   PROVIDERS: Ellis Parents, FNP is PCP   Cardiologist - Gypsy Balsam MD  LABS: Labs reviewed: Acceptable for surgery. (all labs ordered are listed, but only abnormal results are displayed)  Labs Reviewed  SURGICAL PCR SCREEN - Abnormal; Notable for the following components:      Result Value   MRSA, PCR POSITIVE (*)    Staphylococcus aureus POSITIVE (*)     All other components within normal limits  GLUCOSE, CAPILLARY - Abnormal; Notable for the following components:   Glucose-Capillary 119 (*)    All other components within normal limits  BASIC METABOLIC PANEL - Abnormal; Notable for the following components:   Glucose, Bld 112 (*)    All other components within normal limits  CBC  TYPE AND SCREEN     IMAGES:   EKG:   CV:  Past Medical History:  Diagnosis Date   Allergic rhinitis    Anxiety    Asthma    CAD (coronary artery disease)    a. 11/2021 MV: Ant ischemia. EF 52%; b. 11/2021 PCI: LAD 61m (2.25x20 Synergy XD DES), D2 60 (PTCA), LCX 30p/m, 39m/d, OM2 30, RCA large, 20p/d.   Chronic lower back pain    Chronic pain syndrome    followed by pain clinic [facility] in [place]   COPD (chronic obstructive pulmonary disease) (HCC)    DDD (degenerative disc disease), cervical    DDD (degenerative disc disease), lumbar    Depression    Diabetic peripheral neuropathy (HCC)    hands and feet   Essential (primary) hypertension    Fibromyalgia    Full dentures    only wears upper   GERD without esophagitis    History of 2019 novel coronavirus disease (COVID-19)    08-10-2018  per pt in May 2020,  recovered  at home without supplemental oxygen, never admitted to hospital,  residual nasal congestion and loss of taste is coming back , no cough sore throat sob or difficultly breathing,  Negative covid test done 08-07-2018   History of pyelonephritis 09/2017   Hyperlipidemia    Hypothyroidism    IBS (irritable bowel syndrome)    no current med.   Left shoulder pain    Lumbar nerve root impingement    Lumbar post-laminectomy syndrome    Major depressive disorder    Obesity    OSA (obstructive sleep apnea)    08-10-2018  per pt has used in 8 months   Osteoarthritis    Polyneuropathy    PTSD (post-traumatic stress disorder)    Type II diabetes mellitus (HCC)    followed by pcp   Vitamin D deficiency    Wears glasses      Past Surgical History:  Procedure Laterality Date   BLADDER SUSPENSION  2006   CARPAL TUNNEL RELEASE Bilateral right 07/22/2001;  left 01-05-2003  dr Eulah Pont @[facility]    CHOLECYSTECTOMY     COLONOSCOPY W/ POLYPECTOMY     CORONARY PRESSURE/FFR STUDY N/A 12/26/2021   Procedure: INTRAVASCULAR PRESSURE WIRE/FFR STUDY;  Surgeon: Kathleene Hazel, MD;  [facility];  Service: Cardiovascular;  Laterality: N/A;   CORONARY STENT INTERVENTION N/A 12/26/2021   Procedure: CORONARY STENT INTERVENTION;  Surgeon: Kathleene Hazel, MD;  [facility];  Service: Cardiovascular;  Laterality: N/A;   DILATION AND CURETTAGE OF UTERUS     LAPAROSCOPIC CHOLECYSTECTOMY  1992   LEFT HEART CATH AND CORONARY ANGIOGRAPHY N/A 12/26/2021   Procedure: LEFT HEART CATH AND CORONARY ANGIOGRAPHY;  Surgeon: Kathleene Hazel, MD;  [facility];  Service: Cardiovascular;  Laterality: N/A;   LUMBAR LAMINECTOMY  1997   L4 --5   MULTIPLE EXTRACTIONS WITH ALVEOLOPLASTY N/A 11/26/2012   Procedure: MULTIPLE EXTRACTION WITH ALVEOLOPLASTY;  Surgeon: [name], DDS;  [facility];  Service: Oral Surgery;  Laterality: N/A;   SHOULDER ARTHROSCOPY WITH ROTATOR CUFF REPAIR AND SUBACROMIAL DECOMPRESSION Left 08/11/2018   Procedure: SHOULDER ARTHROSCOPY WITH ROTATOR CUFF REPAIR AND SUBACROMIAL DECOMPRESSION and DISTAL CLAVICLE ,DEBRIDEMENT ANTERIOR LABRIL TEAR;  Surgeon: Jodi Geralds, MD;  Location[facility];  Service: Orthopedics;  Laterality: Left;   TONSILLECTOMY  age 89   TOTAL HIP ARTHROPLASTY Left 07/10/2014   Procedure: TOTAL HIP ARTHROPLASTY ANTERIOR APPROACH;  Surgeon: Jodi Geralds, MD;  [facility];  Service: Orthopedics;  Laterality: Left;   TRIGGER FINGER RELEASE Right 2018   thumb   TRIGGER FINGER RELEASE Left 10/22/2015   Procedure: LEFT TRIGGER THUMB RELEASE;  Surgeon: Mack Hook, MD;  Location: [facility];   Service: Orthopedics;  Laterality: Left;   TUBAL LIGATION  yrs ago   ULNAR NERVE TRANSPOSITION Right 03/27/2014   Procedure: RIGHT ULNAR NEUROPLASTY AT THE ELBOW;  Surgeon: Jodi Marble, MD;  Location: [facility];  Service: Orthopedics;  Laterality: Right;   ULNAR NERVE TRANSPOSITION Left 12/08/2016   Procedure: LEFT ULNAR NEUROPLASTY AT THE ELBOW;  Surgeon: Mack Hook, MD;  Location:  SURGERY CENTER;  Service: Orthopedics;  Laterality: Left;   UPPER GASTROINTESTINAL ENDOSCOPY     VAGINAL HYSTERECTOMY  2006   w/  ANTERIOR AND POSTERIOR REPAIR WITH SLING SUSPENSION    MEDICATIONS:  acetaminophen (TYLENOL) 500 MG tablet   albuterol (PROVENTIL HFA;VENTOLIN HFA) 108 (90 Base) MCG/ACT inhaler   albuterol (PROVENTIL) (2.5 MG/3ML)  0.083% nebulizer solution   aspirin EC 81 MG tablet   atorvastatin (LIPITOR) 80 MG tablet   budesonide-formoterol (SYMBICORT) 160-4.5 MCG/ACT inhaler   clonazePAM (KLONOPIN) 0.5 MG tablet   cyclobenzaprine (FLEXERIL) 10 MG tablet   FLUoxetine (PROZAC) 20 MG capsule   FLUoxetine (PROZAC) 40 MG capsule   fluticasone (FLONASE) 50 MCG/ACT nasal spray   glipiZIDE (GLUCOTROL) 5 MG tablet   HYDROcodone-acetaminophen (NORCO) 10-325 MG tablet   levothyroxine (SYNTHROID) 75 MCG tablet   [START ON 10/26/2022] morphine (MS CONTIN) 15 MG 12 hr tablet   Multiple Vitamins-Minerals (ONE A DAY WOMEN 50 PLUS PO)   nitroGLYCERIN (NITROSTAT) 0.4 MG SL tablet   omega-3 acid ethyl esters (LOVAZA) 1 g capsule   OZEMPIC, 1 MG/DOSE, 4 MG/3ML SOPN   pregabalin (LYRICA) 200 MG capsule   QUEtiapine (SEROQUEL) 100 MG tablet   Tiotropium Bromide Monohydrate 1.25 MCG/ACT AERS   zolpidem (AMBIEN) 10 MG tablet   No current facility-administered medications for this encounter.    Jodell Cipro Ward, PA-C [facility] [phone]

## 2022-10-17 NOTE — OR Nursing (Signed)
Patients surgery time 0915. Arrive to admitting at 0645 am. Patient to finish drinking ensure by 0615 am. Can have clear liquids until 0615. No solid foods/milk/creamer after midnight.

## 2022-10-20 ENCOUNTER — Other Ambulatory Visit: Payer: Self-pay

## 2022-10-20 ENCOUNTER — Encounter (HOSPITAL_COMMUNITY)
Admission: RE | Disposition: A | Discharge status: Home / Self Care | Payer: Self-pay | Source: Ambulatory Visit | Attending: Orthopedic Surgery

## 2022-10-20 ENCOUNTER — Ambulatory Visit (HOSPITAL_COMMUNITY): Payer: Medicaid Other

## 2022-10-20 ENCOUNTER — Ambulatory Visit (HOSPITAL_COMMUNITY)
Admission: RE | Admit: 2022-10-20 | Discharge: 2022-10-20 | Disposition: A | Discharge status: Home / Self Care | Payer: Medicaid Other | Source: Ambulatory Visit | Attending: Orthopedic Surgery | Admitting: Orthopedic Surgery

## 2022-10-20 ENCOUNTER — Encounter (HOSPITAL_COMMUNITY): Payer: Self-pay | Admitting: Orthopedic Surgery

## 2022-10-20 ENCOUNTER — Ambulatory Visit (HOSPITAL_COMMUNITY): Payer: Medicaid Other | Admitting: Physician Assistant

## 2022-10-20 ENCOUNTER — Ambulatory Visit (HOSPITAL_COMMUNITY): Payer: Medicaid Other | Admitting: Anesthesiology

## 2022-10-20 DIAGNOSIS — J449 Chronic obstructive pulmonary disease, unspecified: Secondary | ICD-10-CM

## 2022-10-20 DIAGNOSIS — M1611 Unilateral primary osteoarthritis, right hip: Secondary | ICD-10-CM

## 2022-10-20 DIAGNOSIS — Z7984 Long term (current) use of oral hypoglycemic drugs: Secondary | ICD-10-CM | POA: Insufficient documentation

## 2022-10-20 DIAGNOSIS — E119 Type 2 diabetes mellitus without complications: Secondary | ICD-10-CM

## 2022-10-20 DIAGNOSIS — J4489 Other specified chronic obstructive pulmonary disease: Secondary | ICD-10-CM | POA: Diagnosis not present

## 2022-10-20 DIAGNOSIS — Z955 Presence of coronary angioplasty implant and graft: Secondary | ICD-10-CM | POA: Diagnosis not present

## 2022-10-20 DIAGNOSIS — Z87891 Personal history of nicotine dependence: Secondary | ICD-10-CM | POA: Insufficient documentation

## 2022-10-20 DIAGNOSIS — G4733 Obstructive sleep apnea (adult) (pediatric): Secondary | ICD-10-CM | POA: Insufficient documentation

## 2022-10-20 DIAGNOSIS — I2510 Atherosclerotic heart disease of native coronary artery without angina pectoris: Secondary | ICD-10-CM

## 2022-10-20 DIAGNOSIS — Z794 Long term (current) use of insulin: Secondary | ICD-10-CM | POA: Diagnosis not present

## 2022-10-20 DIAGNOSIS — I10 Essential (primary) hypertension: Secondary | ICD-10-CM

## 2022-10-20 DIAGNOSIS — Z6834 Body mass index (BMI) 34.0-34.9, adult: Secondary | ICD-10-CM | POA: Diagnosis not present

## 2022-10-20 HISTORY — PX: TOTAL HIP ARTHROPLASTY: SHX124

## 2022-10-20 HISTORY — DX: Unilateral primary osteoarthritis, right hip: M16.11

## 2022-10-20 LAB — HEMOGLOBIN A1C
Hgb A1c MFr Bld: 8.5 % — ABNORMAL HIGH (ref 4.8–5.6)
Mean Plasma Glucose: 197.25 mg/dL

## 2022-10-20 LAB — GLUCOSE, CAPILLARY
Glucose-Capillary: 201 mg/dL — ABNORMAL HIGH (ref 70–99)
Glucose-Capillary: 201 mg/dL — ABNORMAL HIGH (ref 70–99)

## 2022-10-20 SURGERY — ARTHROPLASTY, HIP, TOTAL, ANTERIOR APPROACH
Anesthesia: Spinal | Site: Hip | Laterality: Right

## 2022-10-20 MED ORDER — PHENYLEPHRINE HCL-NACL 20-0.9 MG/250ML-% IV SOLN
INTRAVENOUS | Status: AC
  Filled 2022-10-20: qty 250

## 2022-10-20 MED ORDER — CHLORHEXIDINE GLUCONATE 4 % EX SOLN: 1.0000 | CUTANEOUS | 1 refills | Status: DC

## 2022-10-20 MED ORDER — ONDANSETRON HCL 4 MG PO TABS: 4.0000 mg | ORAL_TABLET | Freq: Four times a day (QID) | ORAL | Status: DC | PRN

## 2022-10-20 MED ORDER — MUPIROCIN 2 % EX OINT: 1.0000 | TOPICAL_OINTMENT | Freq: Two times a day (BID) | CUTANEOUS | 0 refills | Status: AC

## 2022-10-20 MED ORDER — INSULIN ASPART 100 UNIT/ML IJ SOLN
0.0000 [IU] | INTRAMUSCULAR | Status: DC | PRN
  Administered 2022-10-20: 4 [IU] via SUBCUTANEOUS
  Filled 2022-10-20: qty 1

## 2022-10-20 MED ORDER — MORPHINE SULFATE ER 15 MG PO TBCR
15.0000 mg | EXTENDED_RELEASE_TABLET | Freq: Two times a day (BID) | ORAL | Status: DC
  Administered 2022-10-20: 15 mg via ORAL
  Filled 2022-10-20: qty 1

## 2022-10-20 MED ORDER — OXYCODONE HCL 5 MG PO TABS
5.0000 mg | ORAL_TABLET | ORAL | Status: DC | PRN
  Administered 2022-10-20: 10 mg via ORAL

## 2022-10-20 MED ORDER — OXYCODONE-ACETAMINOPHEN 5-325 MG PO TABS
1.0000 | ORAL_TABLET | Freq: Four times a day (QID) | ORAL | 0 refills | Status: DC | PRN
Start: 2022-10-20 — End: 2023-02-12

## 2022-10-20 MED ORDER — CEFAZOLIN SODIUM-DEXTROSE 2-4 GM/100ML-% IV SOLN
2.0000 g | INTRAVENOUS | Status: AC
  Administered 2022-10-20: 2 g via INTRAVENOUS
  Filled 2022-10-20: qty 100

## 2022-10-20 MED ORDER — METHOCARBAMOL 500 MG IVPB - SIMPLE MED
500.0000 mg | Freq: Four times a day (QID) | INTRAVENOUS | Status: DC | PRN
  Administered 2022-10-20: 500 mg via INTRAVENOUS

## 2022-10-20 MED ORDER — ACETAMINOPHEN 500 MG PO TABS: 1000.0000 mg | ORAL_TABLET | Freq: Once | ORAL | Status: DC

## 2022-10-20 MED ORDER — TRANEXAMIC ACID-NACL 1000-0.7 MG/100ML-% IV SOLN
INTRAVENOUS | Status: AC
  Filled 2022-10-20: qty 100

## 2022-10-20 MED ORDER — OXYCODONE HCL 5 MG PO TABS
ORAL_TABLET | ORAL | Status: AC
  Filled 2022-10-20: qty 2

## 2022-10-20 MED ORDER — CEFAZOLIN SODIUM-DEXTROSE 2-4 GM/100ML-% IV SOLN
2.0000 g | Freq: Once | INTRAVENOUS | Status: AC
  Administered 2022-10-20: 2 g via INTRAVENOUS

## 2022-10-20 MED ORDER — METHOCARBAMOL 500 MG IVPB - SIMPLE MED
INTRAVENOUS | Status: AC
  Filled 2022-10-20: qty 55

## 2022-10-20 MED ORDER — MIDAZOLAM HCL 5 MG/5ML IJ SOLN
INTRAMUSCULAR | Status: DC | PRN
  Administered 2022-10-20: 2 mg via INTRAVENOUS

## 2022-10-20 MED ORDER — DOCUSATE SODIUM 100 MG PO CAPS: 100.0000 mg | ORAL_CAPSULE | Freq: Two times a day (BID) | ORAL | 0 refills | Status: DC

## 2022-10-20 MED ORDER — CHLORHEXIDINE GLUCONATE 0.12 % MT SOLN
15.0000 mL | Freq: Once | OROMUCOSAL | Status: AC
  Administered 2022-10-20: 15 mL via OROMUCOSAL

## 2022-10-20 MED ORDER — FENTANYL CITRATE (PF) 100 MCG/2ML IJ SOLN
INTRAMUSCULAR | Status: DC | PRN
  Administered 2022-10-20: 50 ug via INTRAVENOUS

## 2022-10-20 MED ORDER — MIDAZOLAM HCL 2 MG/2ML IJ SOLN
INTRAMUSCULAR | Status: AC
  Filled 2022-10-20: qty 2

## 2022-10-20 MED ORDER — BUPIVACAINE-EPINEPHRINE 0.25% -1:200000 IJ SOLN
INTRAMUSCULAR | Status: DC | PRN
  Administered 2022-10-20: 30 mL

## 2022-10-20 MED ORDER — LACTATED RINGERS IV BOLUS
250.0000 mL | Freq: Once | INTRAVENOUS | Status: AC
  Administered 2022-10-20: 250 mL via INTRAVENOUS

## 2022-10-20 MED ORDER — PROPOFOL 500 MG/50ML IV EMUL
INTRAVENOUS | Status: DC | PRN
  Administered 2022-10-20: 75 ug/kg/min via INTRAVENOUS

## 2022-10-20 MED ORDER — ORAL CARE MOUTH RINSE: 15.0000 mL | Freq: Once | OROMUCOSAL | Status: AC

## 2022-10-20 MED ORDER — METHOCARBAMOL 500 MG PO TABS: 500.0000 mg | ORAL_TABLET | Freq: Four times a day (QID) | ORAL | Status: DC | PRN

## 2022-10-20 MED ORDER — KETOROLAC TROMETHAMINE 15 MG/ML IJ SOLN
15.0000 mg | Freq: Once | INTRAMUSCULAR | Status: AC
  Administered 2022-10-20: 15 mg via INTRAVENOUS

## 2022-10-20 MED ORDER — BUPIVACAINE LIPOSOME 1.3 % IJ SUSP
INTRAMUSCULAR | Status: AC
  Filled 2022-10-20: qty 10

## 2022-10-20 MED ORDER — DROPERIDOL 2.5 MG/ML IJ SOLN: 0.6250 mg | Freq: Once | INTRAMUSCULAR | Status: DC | PRN

## 2022-10-20 MED ORDER — LACTATED RINGERS IV BOLUS
500.0000 mL | Freq: Once | INTRAVENOUS | Status: AC
  Administered 2022-10-20: 500 mL via INTRAVENOUS

## 2022-10-20 MED ORDER — FENTANYL CITRATE PF 50 MCG/ML IJ SOSY
PREFILLED_SYRINGE | INTRAMUSCULAR | Status: AC
  Filled 2022-10-20: qty 1

## 2022-10-20 MED ORDER — BUPIVACAINE LIPOSOME 1.3 % IJ SUSP: 10.0000 mL | Freq: Once | INTRAMUSCULAR | Status: DC

## 2022-10-20 MED ORDER — PROMETHAZINE HCL 25 MG/ML IJ SOLN: 6.2500 mg | INTRAMUSCULAR | Status: DC | PRN

## 2022-10-20 MED ORDER — ONDANSETRON HCL 4 MG/2ML IJ SOLN: 4.0000 mg | Freq: Four times a day (QID) | INTRAMUSCULAR | Status: DC | PRN

## 2022-10-20 MED ORDER — 0.9 % SODIUM CHLORIDE (POUR BTL) OPTIME
TOPICAL | Status: DC | PRN
Start: 2022-10-20 — End: 2022-10-20
  Administered 2022-10-20: 1000 mL

## 2022-10-20 MED ORDER — FENTANYL CITRATE PF 50 MCG/ML IJ SOSY
25.0000 ug | PREFILLED_SYRINGE | INTRAMUSCULAR | Status: DC | PRN
  Administered 2022-10-20 (×2): 25 ug via INTRAVENOUS

## 2022-10-20 MED ORDER — CEFAZOLIN SODIUM-DEXTROSE 2-4 GM/100ML-% IV SOLN
INTRAVENOUS | Status: AC
  Filled 2022-10-20: qty 100

## 2022-10-20 MED ORDER — ACETAMINOPHEN 325 MG PO TABS: 325.0000 mg | ORAL_TABLET | Freq: Four times a day (QID) | ORAL | Status: DC | PRN

## 2022-10-20 MED ORDER — LACTATED RINGERS IV SOLN: INTRAVENOUS | Status: DC | PRN

## 2022-10-20 MED ORDER — DEXAMETHASONE SODIUM PHOSPHATE 10 MG/ML IJ SOLN
INTRAMUSCULAR | Status: AC
  Filled 2022-10-20: qty 1

## 2022-10-20 MED ORDER — POVIDONE-IODINE 10 % EX SWAB: 2.0000 | Freq: Once | CUTANEOUS | Status: DC

## 2022-10-20 MED ORDER — DEXAMETHASONE SODIUM PHOSPHATE 4 MG/ML IJ SOLN
INTRAMUSCULAR | Status: DC | PRN
  Administered 2022-10-20: 8 mg via INTRAVENOUS

## 2022-10-20 MED ORDER — LACTATED RINGERS IV SOLN: INTRAVENOUS | Status: DC

## 2022-10-20 MED ORDER — TRANEXAMIC ACID-NACL 1000-0.7 MG/100ML-% IV SOLN
1000.0000 mg | Freq: Once | INTRAVENOUS | Status: AC
  Administered 2022-10-20: 1000 mg via INTRAVENOUS

## 2022-10-20 MED ORDER — FENTANYL CITRATE (PF) 100 MCG/2ML IJ SOLN
INTRAMUSCULAR | Status: AC
  Filled 2022-10-20: qty 2

## 2022-10-20 MED ORDER — GLYCOPYRROLATE 0.2 MG/ML IJ SOLN
INTRAMUSCULAR | Status: DC | PRN
Start: 2022-10-20 — End: 2022-10-20
  Administered 2022-10-20: .2 mg via INTRAVENOUS

## 2022-10-20 MED ORDER — PROPOFOL 10 MG/ML IV BOLUS
INTRAVENOUS | Status: DC | PRN
Start: 2022-10-20 — End: 2022-10-20
  Administered 2022-10-20 (×4): 20 mg via INTRAVENOUS

## 2022-10-20 MED ORDER — KETOROLAC TROMETHAMINE 15 MG/ML IJ SOLN
INTRAMUSCULAR | Status: AC
  Filled 2022-10-20: qty 1

## 2022-10-20 MED ORDER — PHENYLEPHRINE HCL-NACL 20-0.9 MG/250ML-% IV SOLN
INTRAVENOUS | Status: DC | PRN
  Administered 2022-10-20: 25 ug/min via INTRAVENOUS

## 2022-10-20 MED ORDER — BUPIVACAINE-EPINEPHRINE 0.25% -1:200000 IJ SOLN
INTRAMUSCULAR | Status: AC
  Filled 2022-10-20: qty 1

## 2022-10-20 MED ORDER — ONDANSETRON HCL 4 MG/2ML IJ SOLN
INTRAMUSCULAR | Status: AC
  Filled 2022-10-20: qty 2

## 2022-10-20 MED ORDER — CELECOXIB 200 MG PO CAPS: 200.0000 mg | ORAL_CAPSULE | Freq: Two times a day (BID) | ORAL | 0 refills | Status: DC

## 2022-10-20 MED ORDER — ASPIRIN 81 MG PO TBEC: 81.0000 mg | DELAYED_RELEASE_TABLET | Freq: Two times a day (BID) | ORAL | Status: AC

## 2022-10-20 MED ORDER — TRANEXAMIC ACID-NACL 1000-0.7 MG/100ML-% IV SOLN
1000.0000 mg | INTRAVENOUS | Status: AC
  Administered 2022-10-20: 1000 mg via INTRAVENOUS
  Filled 2022-10-20: qty 100

## 2022-10-20 MED ORDER — BUPIVACAINE LIPOSOME 1.3 % IJ SUSP
INTRAMUSCULAR | Status: DC | PRN
  Administered 2022-10-20: 10 mL

## 2022-10-20 MED ORDER — WATER FOR IRRIGATION, STERILE IR SOLN
Status: DC | PRN
  Administered 2022-10-20: 2000 mL

## 2022-10-20 MED ORDER — ONDANSETRON HCL 4 MG/2ML IJ SOLN
INTRAMUSCULAR | Status: DC | PRN
Start: 2022-10-20 — End: 2022-10-20
  Administered 2022-10-20: 4 mg via INTRAVENOUS

## 2022-10-20 SURGICAL SUPPLY — 46 items
APL SKNCLS STERI-STRIP NONHPOA (GAUZE/BANDAGES/DRESSINGS) ×1
BAG COUNTER SPONGE SURGICOUNT (BAG) IMPLANT
BAG SPEC THK2 15X12 ZIP CLS (MISCELLANEOUS)
BAG SPNG CNTER NS LX DISP (BAG)
BAG ZIPLOCK 12X15 (MISCELLANEOUS) IMPLANT
BALL HIP CERAMIC (Hips) IMPLANT
BENZOIN TINCTURE PRP APPL 2/3 (GAUZE/BANDAGES/DRESSINGS) IMPLANT
BLADE SAW SGTL 18X1.27X75 (BLADE) ×1 IMPLANT
CLSR STERI-STRIP ANTIMIC 1/2X4 (GAUZE/BANDAGES/DRESSINGS) IMPLANT
COVER PERINEAL POST (MISCELLANEOUS) ×1 IMPLANT
COVER SURGICAL LIGHT HANDLE (MISCELLANEOUS) ×1 IMPLANT
CUP GRIPTON 48MM 100 HIP (Hips) IMPLANT
DRAPE FOOT SWITCH (DRAPES) ×1 IMPLANT
DRAPE STERI IOBAN 125X83 (DRAPES) ×1 IMPLANT
DRAPE U-SHAPE 47X51 STRL (DRAPES) ×2 IMPLANT
DRSG AQUACEL AG ADV 3.5X 6 (GAUZE/BANDAGES/DRESSINGS) ×1 IMPLANT
DRSG AQUACEL AG ADV 3.5X10 (GAUZE/BANDAGES/DRESSINGS) IMPLANT
DURAPREP 26ML APPLICATOR (WOUND CARE) ×1 IMPLANT
ELECT REM PT RETURN 15FT ADLT (MISCELLANEOUS) ×1 IMPLANT
ELIMINATOR HOLE APEX DEPUY (Hips) IMPLANT
GAUZE XEROFORM 1X8 LF (GAUZE/BANDAGES/DRESSINGS) IMPLANT
GLOVE BIOGEL PI IND STRL 8 (GLOVE) ×2 IMPLANT
GLOVE ECLIPSE 7.5 STRL STRAW (GLOVE) ×2 IMPLANT
GOWN STRL REUS W/ TWL XL LVL3 (GOWN DISPOSABLE) ×2 IMPLANT
GOWN STRL REUS W/TWL XL LVL3 (GOWN DISPOSABLE) ×2
HIP BALL CERAMIC (Hips) ×1 IMPLANT
HOLDER FOLEY CATH W/STRAP (MISCELLANEOUS) ×1 IMPLANT
HOOD PEEL AWAY T7 (MISCELLANEOUS) ×3 IMPLANT
KIT TURNOVER KIT A (KITS) IMPLANT
NDL HYPO 22X1.5 SAFETY MO (MISCELLANEOUS) ×2 IMPLANT
NEEDLE HYPO 22X1.5 SAFETY MO (MISCELLANEOUS) ×2 IMPLANT
PACK ANTERIOR HIP CUSTOM (KITS) ×1 IMPLANT
PINN ALTRX NEUT ID X OD 32X48 IMPLANT
SPIKE FLUID TRANSFER (MISCELLANEOUS) ×1 IMPLANT
STAPLER VISISTAT 35W (STAPLE) IMPLANT
STEM FEM ACTIS STD SZ4 (Stem) IMPLANT
STRIP CLOSURE SKIN 1/2X4 (GAUZE/BANDAGES/DRESSINGS) IMPLANT
SUT ETHIBOND NAB CT1 #1 30IN (SUTURE) ×2 IMPLANT
SUT MNCRL AB 3-0 PS2 18 (SUTURE) IMPLANT
SUT VIC AB 0 CT1 36 (SUTURE) ×1 IMPLANT
SUT VIC AB 1 CT1 36 (SUTURE) ×1 IMPLANT
SUT VIC AB 2-0 CT1 27 (SUTURE) ×1
SUT VIC AB 2-0 CT1 TAPERPNT 27 (SUTURE) ×1 IMPLANT
SUT VICRYL+ 3-0 36IN CT-1 (SUTURE) IMPLANT
TRAY FOLEY MTR SLVR 16FR STAT (SET/KITS/TRAYS/PACK) IMPLANT
TUBE SUCTION HIGH CAP CLEAR NV (SUCTIONS) ×1 IMPLANT

## 2022-10-20 NOTE — H&P (Signed)
TOTAL HIP ADMISSION H&P  Patient is admitted for right total hip arthroplasty.  Subjective:  Chief Complaint: right hip pain  HPI: [name], 62 y.o. female, has a history of pain and functional disability in the right hip(s) due to arthritis and patient has failed non-surgical conservative treatments for greater than 12 weeks to include NSAID's and/or analgesics, corticosteriod injections, flexibility and strengthening excercises, supervised PT with diminished ADL's post treatment, use of assistive devices, and activity modification.  Onset of symptoms was gradual starting 3 years ago with gradually worsening course since that time.The patient noted no past surgery on the right hip(s).  Patient currently rates pain in the right hip at 9 out of 10 with activity. Patient has night pain, worsening of pain with activity and weight bearing, trendelenberg gait, pain that interfers with activities of daily living, and pain with passive range of motion. Patient has evidence of subchondral cysts, subchondral sclerosis, periarticular osteophytes, and joint space narrowing by imaging studies. This condition presents safety issues increasing the risk of falls. This patient has had  failure of all reasonable conservative care .  There is no current active infection.  Patient Active Problem List   Diagnosis Date Noted   Dyslipidemia 04/03/2022   Coronary artery disease 12/26/2021   Unstable angina (HCC) 12/26/2021   Abnormal stress test anterior wall ischemia 12/23/2021   Elevated coronary artery calcium score 12/13/2021   Dizziness 04/13/2020   Other insomnia 04/13/2020   Tinnitus of both ears 04/13/2020   Acquired hypothyroidism 08/04/2019   DDD (degenerative disc disease), lumbar 08/04/2019   Diabetes mellitus type 2 in obese 08/04/2019   Essential hypertension 08/04/2019   Flexor tenosynovitis of finger 08/04/2019   Preop cardiovascular exam 05/02/2019   Complete tear of left  rotator cuff 08/11/2018   Arthritis of left acromioclavicular joint 08/11/2018   Glenoid labral tear, left, initial encounter 08/11/2018   Impingement syndrome of left shoulder 08/11/2018   Pyelonephritis 10/07/2017   Bilateral foot pain 04/15/2017   Diabetic polyneuropathy associated with type 2 diabetes mellitus (HCC) 05/06/2016   Hammer toe of left foot 05/06/2016   Metatarsalgia, right foot 05/06/2016   RLS (restless legs syndrome) 04/02/2016   Right arm pain 04/12/2015   Primary osteoarthritis of left hip 07/10/2014   Arthritis 04/28/2013   Depression 04/28/2013   Diabetes (HCC) 04/28/2013   Fibromyalgia 04/28/2013   Postlaminectomy syndrome, lumbar region 04/28/2013   Obstructive sleep apnea 04/02/2011   Past Medical History:  Diagnosis Date   Allergic rhinitis    Anxiety    Asthma    CAD (coronary artery disease)    a. 11/2021 MV: Ant ischemia. EF 52%; b. 11/2021 PCI: LAD 63m (2.25x20 Synergy XD DES), D2 60 (PTCA), LCX 30p/m, 98m/d, OM2 30, RCA large, 20p/d.   Chronic lower back pain    Chronic pain syndrome    followed by pain clinic [facility] in [place]   COPD (chronic obstructive pulmonary disease) (HCC)    DDD (degenerative disc disease), cervical    DDD (degenerative disc disease), lumbar    Depression    Diabetic peripheral neuropathy (HCC)    hands and feet   Essential (primary) hypertension    Fibromyalgia    Full dentures    only wears upper   GERD without esophagitis    History of 2019 novel coronavirus disease (COVID-19)    08-10-2018  per pt in May 2020,  recovered at home without supplemental oxygen, never admitted to hospital,  residual nasal congestion and  loss of taste is coming back , no cough sore throat sob or difficultly breathing,  Negative covid test done 08-07-2018   History of pyelonephritis 09/2017   Hyperlipidemia    Hypothyroidism    IBS (irritable bowel syndrome)    no current med.   Left shoulder pain    Lumbar nerve root  impingement    Lumbar post-laminectomy syndrome    Major depressive disorder    Obesity    OSA (obstructive sleep apnea)    08-10-2018  per pt has used in 8 months   Osteoarthritis    Polyneuropathy    PTSD (post-traumatic stress disorder)    Type II diabetes mellitus (HCC)    followed by pcp   Vitamin D deficiency    Wears glasses     Past Surgical History:  Procedure Laterality Date   BLADDER SUSPENSION  2006   CARPAL TUNNEL RELEASE Bilateral right 07/22/2001;  left 01-05-2003  dr Lori Pont @[facility]    CHOLECYSTECTOMY     COLONOSCOPY W/ POLYPECTOMY     CORONARY PRESSURE/FFR STUDY N/A 12/26/2021   Procedure: INTRAVASCULAR PRESSURE WIRE/FFR STUDY;  Surgeon: Kathleene Hazel, MD;  [facility];  Service: Cardiovascular;  Laterality: N/A;   CORONARY STENT INTERVENTION N/A 12/26/2021   Procedure: CORONARY STENT INTERVENTION;  Surgeon: Kathleene Hazel, MD;  [facility];  Service: Cardiovascular;  Laterality: N/A;   DILATION AND CURETTAGE OF UTERUS     LAPAROSCOPIC CHOLECYSTECTOMY  1992   LEFT HEART CATH AND CORONARY ANGIOGRAPHY N/A 12/26/2021   Procedure: LEFT HEART CATH AND CORONARY ANGIOGRAPHY;  Surgeon: Kathleene Hazel, MD;  [facility];  Service: Cardiovascular;  Laterality: N/A;   LUMBAR LAMINECTOMY  1997   L4 --5   MULTIPLE EXTRACTIONS WITH ALVEOLOPLASTY N/A 11/26/2012   Procedure: MULTIPLE EXTRACTION WITH ALVEOLOPLASTY;  Surgeon: [name], DDS;  [facility];  Service: Oral Surgery;  Laterality: N/A;   SHOULDER ARTHROSCOPY WITH ROTATOR CUFF REPAIR AND SUBACROMIAL DECOMPRESSION Left 08/11/2018   Procedure: SHOULDER ARTHROSCOPY WITH ROTATOR CUFF REPAIR AND SUBACROMIAL DECOMPRESSION and DISTAL CLAVICLE ,DEBRIDEMENT ANTERIOR LABRIL TEAR;  Surgeon: Jodi Geralds, MD;  Location[facility];  Service: Orthopedics;  Laterality: Left;   TONSILLECTOMY  age 54   TOTAL HIP ARTHROPLASTY Left 07/10/2014    Procedure: TOTAL HIP ARTHROPLASTY ANTERIOR APPROACH;  Surgeon: Jodi Geralds, MD;  [facility];  Service: Orthopedics;  Laterality: Left;   TRIGGER FINGER RELEASE Right 2018   thumb   TRIGGER FINGER RELEASE Left 10/22/2015   Procedure: LEFT TRIGGER THUMB RELEASE;  Surgeon: Mack Hook, MD;  Location: [facility];  Service: Orthopedics;  Laterality: Left;   TUBAL LIGATION  yrs ago   ULNAR NERVE TRANSPOSITION Right 03/27/2014   Procedure: RIGHT ULNAR NEUROPLASTY AT THE ELBOW;  Surgeon: Jodi Marble, MD;  Location: [facility];  Service: Orthopedics;  Laterality: Right;   ULNAR NERVE TRANSPOSITION Left 12/08/2016   Procedure: LEFT ULNAR NEUROPLASTY AT THE ELBOW;  Surgeon: Mack Hook, MD;  Location: [facility];  Service: Orthopedics;  Laterality: Left;   UPPER GASTROINTESTINAL ENDOSCOPY     VAGINAL HYSTERECTOMY  2006   w/  ANTERIOR AND POSTERIOR REPAIR WITH SLING SUSPENSION    No current facility-administered medications for this encounter.   No current outpatient medications on file.   Facility-Administered Medications Ordered in Other Encounters  Medication Dose Route Frequency Provider Last Rate Last Admin   acetaminophen (TYLENOL) tablet  1,000 mg  1,000 mg Oral Once Lewie Loron, MD       bupivacaine liposome (EXPAREL) 1.3 % injection 133 mg  10 mL Other Once Jodi Geralds, MD       ceFAZolin (ANCEF) IVPB 2g/100 mL premix  2 g Intravenous On Call to OR Jodi Geralds, MD       insulin aspart (novoLOG) injection 0-14 Units  0-14 Units Subcutaneous Q2H PRN Lewie Loron, MD   4 Units at 10/20/22 1610   lactated ringers infusion   Intravenous Continuous Stoltzfus, Nelle Don, DO 10 mL/hr at 10/20/22 0734 New Bag at 10/20/22 0734   phenylephrine (NEOSYNEPHRINE) 20-0.9 MG/250ML-% infusion            povidone-iodine 10 % swab 2 Application  2 Application Topical Once Jodi Geralds, MD       tranexamic acid (CYKLOKAPRON) IVPB 1,000 mg   1,000 mg Intravenous To OR Jodi Geralds, MD       Allergies  Allergen Reactions   Adhesive [Tape] Hives   Latex Hives   Dilaudid [Hydromorphone] Hives   Oxycodone Itching    Social History   Tobacco Use   Smoking status: Former    Current packs/day: 0.00    Average packs/day: 1.5 packs/day for 30.0 years (45.0 ttl pk-yrs)    Types: Cigarettes    Start date: 11/30/1977    Quit date: 12/01/2007    Years since quitting: 14.8   Smokeless tobacco: Never  Substance Use Topics   Alcohol use: Not Currently    Family History  Problem Relation Age of Onset   Diabetes Mother    Heart disease Mother    Hypertension Mother    Mental illness Mother      Review of Systems ROS: I have reviewed the patient's review of systems thoroughly and there are no positive responses as relates to the HPI.  Objective:  Physical Exam  Vital signs in last 24 hours: Temp:  [98.1 F (36.7 C)] 98.1 F (36.7 C) (09/23 0652) Pulse Rate:  [74] 74 (09/23 0652) Resp:  [16] 16 (09/23 0652) BP: (133)/(86) 133/86 (09/23 0652) SpO2:  [96 %] 96 % (09/23 0652) Weight:  [98 kg] 98 kg (09/23 0644) Well-developed well-nourished patient in no acute distress. Alert and oriented x3 HEENT:within normal limits Cardiac: Regular rate and rhythm Pulmonary: Lungs clear to auscultation Abdomen: Soft and nontender.  Normal active bowel sounds  Musculoskeletal: (Right hip: Painful range of motion.  Limited range of motion.  No instability.  Neuro vas intact distally   Estimated body mass index is 34.86 kg/m as calculated from the following:   Height as of this encounter: 5\' 6"  (1.676 m).   Weight as of this encounter: 98 kg.  Labs: Recent Results (from the past 2160 hour(s))  Glucose, capillary     Status: Abnormal   Collection Time: 10/07/22 11:30 AM  Result Value Ref Range   Glucose-Capillary 119 (H) 70 - 99 mg/dL    Comment: Glucose reference range applies only to samples taken after fasting for at least 8  hours.  Surgical pcr screen     Status: Abnormal   Collection Time: 10/07/22 11:50 AM   Specimen: Nasal Mucosa; Nasal Swab  Result Value Ref Range   MRSA, PCR POSITIVE (A) NEGATIVE    Comment: RESULT CALLED TO, READ BACK BY AND VERIFIED WITH: HESTER, T. RN AT [phone] ON 00/11/2022 BY MECIAL J.    Staphylococcus aureus POSITIVE (A) NEGATIVE    Comment: (NOTE) The Xpert  SA Assay (FDA approved for NASAL specimens in patients 36 years of age and older), is one component of a comprehensive surveillance program. It is not intended to diagnose infection nor to guide or monitor treatment. Performed at [facility], [address] [place], [place] [address]   Basic metabolic panel per protocol     Status: Abnormal   Collection Time: 10/07/22 11:53 AM  Result Value Ref Range   Sodium 141 135 - 145 mmol/L   Potassium 4.2 3.5 - 5.1 mmol/L   Chloride 102 98 - 111 mmol/L   CO2 27 22 - 32 mmol/L   Glucose, Bld 112 (H) 70 - 99 mg/dL    Comment: Glucose reference range applies only to samples taken after fasting for at least 8 hours.   BUN 12 6 - 20 mg/dL   Creatinine, Ser 5.78 0.44 - 1.00 mg/dL   Calcium 9.7 8.9 - 46.9 mg/dL   GFR, Estimated >62 >95 mL/min    Comment: (NOTE) Calculated using the CKD-EPI Creatinine Equation (2021)    Anion gap 12 5 - 15    Comment: Performed at [facility], [address] [place], [place] [address]  CBC per protocol     Status: None   Collection Time: 10/07/22 11:53 AM  Result Value Ref Range   WBC 6.3 4.0 - 10.5 K/uL   RBC 4.46 3.87 - 5.11 MIL/uL   Hemoglobin 12.5 12.0 - 15.0 g/dL   HCT 24.4 01.0 - 27.2 %   MCV 89.5 80.0 - 100.0 fL   MCH 28.0 26.0 - 34.0 pg   MCHC 31.3 30.0 - 36.0 g/dL   RDW 53.6 64.4 - 03.4 %   Platelets 201 150 - 400 K/uL   nRBC 0.0 0.0 - 0.2 %    Comment: Performed at [facility], [address], [place], [place] [address]  Type and screen [facility]      Status: None   Collection Time: 10/07/22 11:53 AM  Result Value Ref Range   ABO/RH(D) O NEG    Antibody Screen NEG    Sample Expiration 10/21/2022,2359    Extend sample reason      NO TRANSFUSIONS OR PREGNANCY IN THE PAST 3 MONTHS Performed at [facility], [address] [place], [place] [address]   Glucose, capillary     Status: Abnormal   Collection Time: 10/20/22  6:59 AM  Result Value Ref Range   Glucose-Capillary 201 (H) 70 - 99 mg/dL    Comment: Glucose reference range applies only to samples taken after fasting for at least 8 hours.    Imaging Review Plain radiographs demonstrate severe degenerative joint disease of the right hip(s). The bone quality appears to be fair for age and reported activity level.      Assessment/Plan:  End stage arthritis, right hip(s)  The patient history, physical examination, clinical judgement of the provider and imaging studies are consistent with end stage degenerative joint disease of the right hip(s) and total hip arthroplasty is deemed medically necessary. The treatment options including medical management, injection therapy, arthroscopy and arthroplasty were discussed at length. The risks and benefits of total hip arthroplasty were presented and reviewed. The risks due to aseptic loosening, infection, stiffness, dislocation/subluxation,  thromboembolic complications and other imponderables were discussed.  The patient acknowledged the explanation, agreed to proceed with the plan and consent was signed. Patient is being admitted for inpatient treatment for surgery, pain control, PT, OT,  prophylactic antibiotics, VTE prophylaxis, progressive ambulation and ADL's and discharge planning.The patient is planning to be discharged home with home health services

## 2022-10-20 NOTE — Discharge Instructions (Signed)

## 2022-10-20 NOTE — H&P (Signed)
The note originally documented on this encounter has been moved the the encounter in which it belongs.

## 2022-10-20 NOTE — Transfer of Care (Signed)
Immediate Anesthesia Transfer of Care Note  Patient: [name]  Procedure(s) Performed: TOTAL HIP ARTHROPLASTY ANTERIOR APPROACH (Right: Hip)  Patient Location: PACU  Anesthesia Type:MAC combined with regional for post-op pain  Level of Consciousness: awake and alert   Airway & Oxygen Therapy: Patient Spontanous Breathing and Patient connected to face mask oxygen  Post-op Assessment: Report given to RN and Post -op Vital signs reviewed and stable  Post vital signs: Reviewed and stable  Last Vitals:  Vitals Value Taken Time  BP 104/71 10/20/22 1125  Temp    Pulse 78 10/20/22 1126  Resp 19 10/20/22 1126  SpO2 100 % 10/20/22 1126  Vitals shown include unfiled device data.  Last Pain:  Vitals:   10/20/22 0652  TempSrc: Oral         Complications: No notable events documented.

## 2022-10-20 NOTE — Op Note (Signed)
PATIENT ID:      [name]  MRN:     [id number] DOB/AGE:    03-30-1960 / 62 y.o.       OPERATIVE REPORT    DATE OF PROCEDURE:  10/20/2022       PREOPERATIVE DIAGNOSIS:  RIGHT HIP DEGENERATIVE JOINT DISEASE                                                       Estimated body mass index is 34.86 kg/m as calculated from the following:   Height as of this encounter: 5\' 6"  (1.676 m).   Weight as of this encounter: 98 kg.     POSTOPERATIVE DIAGNOSIS:  RIGHT HIP DEGENERATIVE JOINT DISEASE                                                           PROCEDURE:  1. right total hip arthroplasty using a 48 mm DePuy gription Cup, [employer],  neutral liner, a +1.5 32 mm ceramic head,  and a #4  Actis stem, 2.interpretation of multiple intraoperative fluoroscopic images   SURGEON: Harvie Junior    ASSISTANT:   Gus Puma PA-C  (present throughout entire procedure and necessary for timely completion of the procedure)  ANESTHESIA: spinal  BLOOD LOSS: 150cc Tranexamic Acid: 1 gram IV DRAINS: None COMPLICATIONS: None    NDICATIONS FOR PROCEDURE:Patient with end-stage arthritis of the right hip.  X-rays show bone-on-bone arthritic changes. Despite conservative measures with observation, anti-inflammatory medicine, narcotics, use of a cane, has severe unremitting pain and can ambulate only less than 1 block before resting.  Patient desires elective right total hip arthroplasty to decrease pain and increase function. The risks, benefits, and alternatives were discussed at length including but not limited to the risks of infection, bleeding, nerve injury, stiffness, blood clots, the need for revision surgery, cardiopulmonary complications, among others, and they were willing to proceed.Benefits have been discussed. Questions answered.     PROCEDURE IN DETAIL: The patient was identified by armband,  received preoperative IV antibiotics in the holding area at [facility], taken to the operating room , appropriate anesthetic monitors  were attached and spinal anesthesia was induced.  The patient was placed onto the hot bed and all bony prominences were well-padded.The right hip was prepped and draped for an anterior approach to the hip.  An incision was made and the subcutaneous dissection was down to the level of the tensor fascia.  The fascia was opened and finger dissected.  The bleeders coming across the anterior portion of the hip were identified and cauterized. Retractors were put in place above and below the femoral neck.  The capsule was opened and tagged and a provisional neck cut was made.  The head was removed and sized on the back table.  The acetabulum was sequentially reamed to a level of 47 mm and a 48 mm gription coated  cup was hammered into place with 45 of lateral opening and 30 of anteversion.fluoroscopy was used to ensure this position of the cup.  Attention was turned towards the femur where the leg was  actually rotated, extended, and adduction did.  The femur was sequentially broached until a size of 4 broach gave a perfect fit and fill.at this point a  1.5 mm delta ceramic hip ball was placed and the hip reduced.  Fluoroscopic images were taken to assess the leg length, fit and fill of the stem, and cup position.  We were happy with the construct at this point.  The 4 broach was removed and a final Actis stem with standard offset  and a 1.5 mm ceramic hip ball was placed and reduced.  Final images were taken to make certain there were happy with the position at this point.   The capsule was closed with #1 Vicryl suture.  The tensor fascia was closed with 0 Vicryl suture.  The skin was then closed with combination of 0 and 2-0 Vicryl suture.  The top layer was with 3-0 Monocryl suture.  Benzoin and Steri-Strips were applied  and a sterile compressive dressing was applied and the patient taken to recovery room she noted be in satisfactory  condition.  Past medical Motion for the procedure was approximately 300 cc.  Of note Gus Puma was present the entire case and assisted by retraction of tissues, manipulation of the leg, and closing the minimize or time.     Harvie Junior 10/20/2022, 11:06 AM

## 2022-10-20 NOTE — Progress Notes (Signed)
Physical Therapy Evaluation Patient Details Name: [name] MRN: [id number] DOB: 1960-08-12 Today's Date: 10/20/2022  History of Present Illness  62 yo female presents to therapy s/p R THA on 10/20/2022 due to failure of conservative measures. Pt PMH includes but is not limited to: CAD, HLD, hypothyroidism, DDD, DM II, HTN, fibromyalgia, OSA, LBP s/p laminectomy, COPD, neuropathy, L THA (2016),.  Clinical Impression      Oluwatoyin Artuso is a 62 y.o. female POD 0 s/p R THA. Patient reports IND with mobility at baseline. Patient is now limited by functional impairments (see PT problem list below) and requires CGA for transfers and gait with RW. Patient was able to ambulate 45 feet x 2  with RW and CGA and cues for safe walker management. Patient educated on safe sequencing for stair mobility, fall risk prevention, slowly increasing activity, pain management and goal, use of CP/ice, car transfers pt  and spouse  verbalized understanding of safe guarding position for people assisting with mobility. Patient instructed in exercises to facilitate ROM and circulation reviewed and HO provided. Patient will benefit from continued skilled PT interventions to address impairments and progress towards PLOF. Patient has met mobility goals at adequate level for discharge home with family support and [facility] services; will continue to follow if pt continues acute stay to progress towards Mod I goals.     If plan is discharge home, recommend the following: A little help with walking and/or transfers;A little help with bathing/dressing/bathroom;Assistance with cooking/housework;Assist for transportation;Help with stairs or ramp for entrance   Can travel by private vehicle        Equipment Recommendations Rolling walker (2 wheels);Other (comment) (RW provided and adjusted at eval. pt is requesting [facility])  Recommendations for Other Services       Functional Status Assessment Patient has had a  recent decline in their functional status and demonstrates the ability to make significant improvements in function in a reasonable and predictable amount of time.     Precautions / Restrictions Precautions Precautions: Knee;Fall Restrictions Weight Bearing Restrictions: No      Mobility  Bed Mobility Overal bed mobility: Needs Assistance Bed Mobility: Supine to Sit     Supine to sit: Contact guard, HOB elevated, Used rails     General bed mobility comments: cues and increased time    Transfers Overall transfer level: Needs assistance Equipment used: Rolling walker (2 wheels) Transfers: Sit to/from Stand Sit to Stand: Contact guard assist           General transfer comment: cues for proper UE and AD placement with bed, recliner and commode transfers    Ambulation/Gait Ambulation/Gait assistance: Contact guard assist Gait Distance (Feet): 45 Feet Assistive device: Rolling walker (2 wheels) Gait Pattern/deviations: Step-to pattern, Antalgic Gait velocity: decreased     General Gait Details: min cues for R LE placement wtih gait tasks  Stairs Stairs: Yes Stairs assistance: Contact guard assist Stair Management: Two rails Number of Stairs: 2 General stair comments: cues for safety, technique and sequencing  Wheelchair Mobility     Tilt Bed    Modified Rankin (Stroke Patients Only)       Balance Overall balance assessment: Needs assistance Sitting-balance support: Feet supported Sitting balance-Leahy Scale: Good     Standing balance support: Bilateral upper extremity supported, During functional activity, Reliant on assistive device for balance Standing balance-Leahy Scale: Fair Standing balance comment: static standing no UE support  Pertinent Vitals/Pain Pain Assessment Pain Assessment: 0-10 Pain Score: 7  Pain Location: R hip Pain Descriptors / Indicators: Aching, Burning, Discomfort, Dull, Grimacing,  Operative site guarding, Penetrating Pain Intervention(s): Limited activity within patient's tolerance, Monitored during session, Premedicated before session, Repositioned, Ice applied    Home Living Family/patient expects to be discharged to:: Private residence Living Arrangements: Spouse/significant other Available Help at Discharge: Family Type of Home: House Home Access: Stairs to enter Entrance Stairs-Rails: Right;Left;Can reach both Secretary/administrator of Steps: 3   Home Layout: One level Home Equipment: Cane - single point;Cane - quad      Prior Function Prior Level of Function : Independent/Modified Independent;Driving             Mobility Comments: '       Extremity/Trunk Assessment        Lower Extremity Assessment Lower Extremity Assessment: RLE deficits/detail RLE Deficits / Details: ankle DF/PF; SLR , 10 degree lag RLE Sensation: decreased light touch    Cervical / Trunk Assessment Cervical / Trunk Assessment: Back Surgery  Communication   Communication Communication: No apparent difficulties  Cognition Arousal: Alert Behavior During Therapy: WFL for tasks assessed/performed Overall Cognitive Status: Within Functional Limits for tasks assessed                                          General Comments      Exercises Total Joint Exercises Ankle Circles/Pumps: AROM, Both, 15 reps Quad Sets: AROM, Right, 5 reps Gluteal Sets: AROM, 5 reps, Both Heel Slides: AROM, Right, 5 reps Hip ABduction/ADduction: AROM, Right, 5 reps, Standing Long Arc Quad: AROM, Right, 5 reps, Seated Knee Flexion: AROM, 5 reps, Right, Standing Standing Hip Extension: AROM, Right, 5 reps, Standing   Assessment/Plan    PT Assessment Patient needs continued PT services  PT Problem List Decreased strength;Decreased range of motion;Decreased activity tolerance;Decreased balance;Decreased mobility;Decreased coordination;Pain       PT Treatment  Interventions DME instruction;Gait training;Stair training;Functional mobility training;Therapeutic activities;Therapeutic exercise;Balance training;Neuromuscular re-education;Patient/family education;Modalities    PT Goals (Current goals can be found in the Care Plan section)  Acute Rehab PT Goals Patient Stated Goal: walk without pain PT Goal Formulation: With patient Time For Goal Achievement: 11/03/22 Potential to Achieve Goals: Good    Frequency 7X/week     Co-evaluation               AM-PAC PT "6 Clicks" Mobility  Outcome Measure Help needed turning from your back to your side while in a flat bed without using bedrails?: A Little Help needed moving from lying on your back to sitting on the side of a flat bed without using bedrails?: A Little Help needed moving to and from a bed to a chair (including a wheelchair)?: A Little Help needed standing up from a chair using your arms (e.g., wheelchair or bedside chair)?: A Little Help needed to walk in hospital room?: A Little Help needed climbing 3-5 steps with a railing? : A Little 6 Click Score: 18    End of Session Equipment Utilized During Treatment: Gait belt Activity Tolerance: Patient tolerated treatment well     PT Visit Diagnosis: Unsteadiness on feet (R26.81);Other abnormalities of gait and mobility (R26.89);Muscle weakness (generalized) (M62.81);Difficulty in walking, not elsewhere classified (R26.2);Pain Pain - Right/Left: Right Pain - part of body: Leg;Hip    Time: 9518-8416 PT Time Calculation (min) (ACUTE ONLY):  54 min   Charges:   PT Evaluation $PT Eval Low Complexity: 1 Low PT Treatments $Gait Training: 8-22 mins $Therapeutic Exercise: 8-22 mins $Therapeutic Activity: 8-22 mins PT General Charges $$ ACUTE PT VISIT: 1 Visit         Johnny Bridge, PT Acute Rehab   Jacqualyn Posey 10/20/2022, 4:45 PM

## 2022-10-20 NOTE — Anesthesia Procedure Notes (Addendum)
Spinal  Patient location during procedure: OR Start time: 10/20/2022 9:35 AM End time: 10/20/2022 9:40 AM Reason for block: surgical anesthesia Staffing Performed: anesthesiologist  Anesthesiologist: Lewie Loron, MD Performed by: Lewie Loron, MD Authorized by: Lewie Loron, MD   Preanesthetic Checklist Completed: patient identified, IV checked, site marked, risks and benefits discussed, surgical consent, monitors and equipment checked, pre-op evaluation and timeout performed Spinal Block Patient position: sitting Prep: DuraPrep and site prepped and draped Patient monitoring: heart rate, continuous pulse ox and blood pressure Approach: right paramedian Location: L2-3 Injection technique: single-shot Needle Needle type: Spinocan  Needle gauge: 25 G Needle length: 9 cm Additional Notes Expiration date of kit checked and confirmed. Patient tolerated procedure well, without complications.

## 2022-10-21 ENCOUNTER — Encounter (HOSPITAL_COMMUNITY): Payer: Self-pay | Admitting: Orthopedic Surgery

## 2022-10-21 NOTE — Anesthesia Postprocedure Evaluation (Signed)
Anesthesia Post Note  Patient: [name]  Procedure(s) Performed: TOTAL HIP ARTHROPLASTY ANTERIOR APPROACH (Right: Hip)     Patient location during evaluation: PACU Anesthesia Type: Spinal Level of consciousness: sedated and patient cooperative Pain management: pain level controlled Vital Signs Assessment: post-procedure vital signs reviewed and stable Respiratory status: spontaneous breathing Cardiovascular status: stable Anesthetic complications: no   No notable events documented.  Last Vitals:  Vitals:   10/20/22 1400 10/20/22 1533  BP: 103/69 (!) 110/59  Pulse: 70 77  Resp: 18   Temp:    SpO2: 96% 95%    Last Pain:  Vitals:   10/20/22 1533  TempSrc:   PainSc: 5                  Lewie Loron

## 2023-01-10 ENCOUNTER — Other Ambulatory Visit: Payer: Self-pay | Admitting: Cardiology

## 2023-02-05 ENCOUNTER — Encounter: Payer: Self-pay | Admitting: Cardiology

## 2023-02-05 DIAGNOSIS — E559 Vitamin D deficiency, unspecified: Secondary | ICD-10-CM | POA: Insufficient documentation

## 2023-02-05 DIAGNOSIS — J45909 Unspecified asthma, uncomplicated: Secondary | ICD-10-CM | POA: Insufficient documentation

## 2023-02-05 DIAGNOSIS — Z973 Presence of spectacles and contact lenses: Secondary | ICD-10-CM | POA: Insufficient documentation

## 2023-02-05 DIAGNOSIS — J309 Allergic rhinitis, unspecified: Secondary | ICD-10-CM | POA: Insufficient documentation

## 2023-02-05 DIAGNOSIS — G629 Polyneuropathy, unspecified: Secondary | ICD-10-CM | POA: Insufficient documentation

## 2023-02-05 DIAGNOSIS — F4310 Post-traumatic stress disorder, unspecified: Secondary | ICD-10-CM | POA: Insufficient documentation

## 2023-02-05 DIAGNOSIS — K219 Gastro-esophageal reflux disease without esophagitis: Secondary | ICD-10-CM | POA: Insufficient documentation

## 2023-02-05 DIAGNOSIS — E669 Obesity, unspecified: Secondary | ICD-10-CM | POA: Insufficient documentation

## 2023-02-05 DIAGNOSIS — K589 Irritable bowel syndrome without diarrhea: Secondary | ICD-10-CM | POA: Insufficient documentation

## 2023-02-05 DIAGNOSIS — M5030 Other cervical disc degeneration, unspecified cervical region: Secondary | ICD-10-CM | POA: Insufficient documentation

## 2023-02-05 DIAGNOSIS — F419 Anxiety disorder, unspecified: Secondary | ICD-10-CM | POA: Insufficient documentation

## 2023-02-05 DIAGNOSIS — G894 Chronic pain syndrome: Secondary | ICD-10-CM | POA: Insufficient documentation

## 2023-02-05 DIAGNOSIS — Z8616 Personal history of COVID-19: Secondary | ICD-10-CM | POA: Insufficient documentation

## 2023-02-05 DIAGNOSIS — J449 Chronic obstructive pulmonary disease, unspecified: Secondary | ICD-10-CM | POA: Insufficient documentation

## 2023-02-05 DIAGNOSIS — M5416 Radiculopathy, lumbar region: Secondary | ICD-10-CM | POA: Insufficient documentation

## 2023-02-05 DIAGNOSIS — M545 Low back pain, unspecified: Secondary | ICD-10-CM | POA: Insufficient documentation

## 2023-02-05 DIAGNOSIS — M25512 Pain in left shoulder: Secondary | ICD-10-CM | POA: Insufficient documentation

## 2023-02-06 ENCOUNTER — Ambulatory Visit: Payer: Medicaid Other | Admitting: Cardiology

## 2023-02-11 NOTE — Progress Notes (Signed)
Cardiology Office Note:  .   Date:  02/12/2023  ID:  [name], DOB 1960/08/21, MRN [id number] PCP: Ellis Parents, FNP  [facility] Providers Cardiologist:  Gypsy Balsam, MD    History of Present Illness: [name] is a 63 y.o. female with a past medical history of hypertension, CAD s/p PTCA and DES to mLAD 2023, OSA, COPD, GERD, IBS, DM2, hypothyroidism, fibromyalgia, dyslipidemia, former tobacco abuse.  12/27/2021 severe calcified M LAD stenosis PTCA/DES x 1 to M LAD, PTCA with balloon angioplasty to the ostium of the diagonal branch. 12/17/2021 Lexiscan findings consistent with ischemia, intermediate risk 09/21/2021 echo EF 55 to 60%, impaired relaxation, trivial MR, mild aortic sclerosis without stenosis  Most recently she was evaluated by Dr. Bing Matter on 04/03/2022, she was stable from a cardiac perspective advised to follow-up in 6 months.  She was recently evaluated by her pulmonologist, underwent CT of her chest for lung cancer screening, was noted to have severe coronary artery calcification and send has follow back up with cardiology.  She presents today for follow-up after recent testing.  She offers no formal complaints.  She is asymptomatic without any episodes of angina.  She recently underwent total right hip repair, did her own PT at home and is feeling good.  She stays physically active through out the day although does not participate in any formal exercise.  She has needed to take her nitroglycerin once, but it did not help.  She says at times she does feel chest pain when she is having a panic attack, otherwise has no chest pain.  She has shortness of breath with exertion however she states this is related to being out of shape and also from her lung disease.  She states she was advised she should have a repeat echocardiogram however her most recent echo revealed normal EF, grade 1 DD, no indications for repeat examination from a  cardiac perspective.  She denies chest pain, palpitations, dyspnea, pnd, orthopnea, n, v, dizziness, syncope, edema, weight gain, or early satiety.   ROS: Review of Systems  Respiratory:  Positive for shortness of breath.   All other systems reviewed and are negative.    Studies Reviewed: .        Cardiac Studies & Procedures   CARDIAC CATHETERIZATION  CARDIAC CATHETERIZATION 12/26/2021  Narrative   Prox RCA to Dist RCA lesion is 20% stenosed.   Prox Cx to Mid Cx lesion is 30% stenosed.   2nd Mrg lesion is 30% stenosed.   Mid Cx to Dist Cx lesion is 40% stenosed.   Mid LAD lesion is 90% stenosed.   2nd Diag lesion is 60% stenosed.   A drug-eluting stent was successfully placed using a SYNERGY XD 2.25X20.   Balloon angioplasty was performed using a BALLN EMERGE MR 2.0X12.   Post intervention, there is a 50% residual stenosis.   Post intervention, there is a 0% residual stenosis.  Severe, calcified mid LAD stenosis. Moderate ostial diagonal stenosis arising just above the LAD lesion. Successful PTCA/DES x 1 mid LAD Successful PTCA with balloon angioplasty ostium of the Diagonal branch Mild to moderate Circumflex/OM disease The RCA is heavily calcified throughout with minimal obstructive disease. Normal LV filling pressures  Recommendations: Same day post PCI discharge. DAPT with ASA and Plavix for at least six months. Continue statin. Smoking cessation is advised.  Findings Coronary Findings Diagnostic  Dominance: Right  Left Anterior Descending Vessel is large. Mid LAD lesion is 90% stenosed.  The lesion is calcified.  Second Diagonal Branch Vessel is moderate in size. 2nd Diag lesion is 60% stenosed.  Left Circumflex Vessel is large. Prox Cx to Mid Cx lesion is 30% stenosed. The lesion is calcified. Mid Cx to Dist Cx lesion is 40% stenosed.  First Obtuse Marginal Branch Vessel is small in size.  Second Obtuse Marginal Branch Vessel is moderate in size. 2nd Mrg  lesion is 30% stenosed.  Right Coronary Artery Vessel is large. Prox RCA to Dist RCA lesion is 20% stenosed. The lesion is calcified.  Intervention  Mid LAD lesion Stent CATH VISTA GUIDE 6FR XBLAD3.5 guide catheter was inserted. Lesion crossed with guidewire using a GUIDEWIRE PRESSURE X 175. Pre-stent angioplasty was performed using a BALLN SAPPHIRE 2.0X15. A drug-eluting stent was successfully placed using a SYNERGY XD 2.25X20. Stent strut is well apposed. Post-stent angioplasty was performed using a BALL SAPPHIRE NC24 2.50X15. Post-Intervention Lesion Assessment The intervention was successful. Pre-interventional TIMI flow is 3. Post-intervention TIMI flow is 3. No complications occurred at this lesion. There is a 0% residual stenosis post intervention.  2nd Diag lesion Angioplasty Balloon angioplasty was performed using a BALLN EMERGE MR 2.0X12. Post-Intervention Lesion Assessment The intervention was successful. Pre-interventional TIMI flow is 3. Post-intervention TIMI flow is 3. No complications occurred at this lesion. There is a 50% residual stenosis post intervention.   STRESS TESTS  MYOCARDIAL PERFUSION IMAGING 12/17/2021  Narrative   Findings are consistent with ischemia. The study is intermediate risk.   LV perfusion is abnormal. Defect 1: There is a medium defect with moderate reduction in uptake present in the anterior location(s) that is reversible.   Left ventricular function is normal. Nuclear stress EF: 52 %. End diastolic cavity size is normal.   Prior study not available for comparison.              Risk Assessment/Calculations:             Physical Exam:   VS:  BP 126/86   Pulse 86   Ht 5\' 6"  (1.676 m)   Wt 209 lb (94.8 kg)   BMI 33.73 kg/m    Wt Readings from Last 3 Encounters:  02/12/23 209 lb (94.8 kg)  10/20/22 216 lb (98 kg)  10/07/22 216 lb (98 kg)    GEN: Well nourished, well developed in no acute distress NECK: No JVD; No carotid  bruits CARDIAC: RRR, no murmurs, rubs, gallops RESPIRATORY:  Clear to auscultation without rales, wheezing or rhonchi  ABDOMEN: Soft, non-tender, non-distended EXTREMITIES:  No edema; No deformity   ASSESSMENT AND PLAN: .   Coronary artery disease-s/p left heart cath in December 2023 severe calcified M LAD stenosis PTCA/DES x 1 to M LAD, PTCA with balloon angioplasty to the ostium of the diagonal branch.  She states she was advised she needed a stress evaluation by her pulmonologist however she is stable with no anginal symptoms. No indication for ischemic evaluation.  Continue aspirin 81 mg daily, continue Lipitor 80 mg daily, continue nitroglycerin as needed--as needed one-time. Heart healthy diet and regular cardiovascular exercise encouraged.    Hypertension-blood pressure is well-controlled at 126/86, currently not on any antihypertensive agents.  COPD/OSA-follows with pulmonology, wears oxygen 2 L at night.  She states she has an upcoming sleep evaluation to see if she can get off the oxygen.  Dyslipidemia-most recent LDL was controlled at 66 on 11/12/2022, this appears to be monitored by her PCP, continue Lipitor 80 mg daily.  Former tobacco abuse-complete cessation  for many years.        Dispo:  Follow up in 3 months.   Signed, Flossie Dibble, NP

## 2023-02-12 ENCOUNTER — Ambulatory Visit: Payer: Medicaid Other | Attending: Cardiology | Admitting: Cardiology

## 2023-02-12 ENCOUNTER — Encounter: Payer: Self-pay | Admitting: Cardiology

## 2023-02-12 VITALS — BP 126/86 | HR 86 | Ht 66.0 in | Wt 209.0 lb

## 2023-02-12 DIAGNOSIS — I2510 Atherosclerotic heart disease of native coronary artery without angina pectoris: Secondary | ICD-10-CM

## 2023-02-12 DIAGNOSIS — J449 Chronic obstructive pulmonary disease, unspecified: Secondary | ICD-10-CM

## 2023-02-12 DIAGNOSIS — G4733 Obstructive sleep apnea (adult) (pediatric): Secondary | ICD-10-CM

## 2023-02-12 DIAGNOSIS — I10 Essential (primary) hypertension: Secondary | ICD-10-CM | POA: Diagnosis not present

## 2023-02-12 DIAGNOSIS — E785 Hyperlipidemia, unspecified: Secondary | ICD-10-CM

## 2023-02-12 NOTE — Patient Instructions (Signed)
Medication Instructions:  Your physician recommends that you continue on your current medications as directed. Please refer to the Current Medication list given to you today.  *If you need a refill on your cardiac medications before your next appointment, please call your pharmacy*   Lab Work: NONE If you have labs (blood work) drawn today and your tests are completely normal, you will receive your results only by: [place] (if you have MyChart) OR A paper copy in the mail If you have any lab test that is abnormal or we need to change your treatment, we will call you to review the results.   Testing/Procedures: NONE   Follow-Up: At [facility], you and your health needs are our priority.  As part of our continuing mission to provide you with exceptional heart care, we have created designated Provider Care Teams.  These Care Teams include your primary Cardiologist (physician) and Advanced Practice Providers (APPs -  Physician Assistants and Nurse Practitioners) who all work together to provide you with the care you need, when you need it.  We recommend signing up for the patient portal called "MyChart".  Sign up information is provided on this After Visit Summary.  MyChart is used to connect with patients for Virtual Visits (Telemedicine).  Patients are able to view lab/test results, encounter notes, upcoming appointments, etc.  Non-urgent messages can be sent to your provider as well.   To learn more about what you can do with MyChart, go to [email or web address].    Your next appointment:   3 month(s)  Provider:   Jenne Campus, MD    Other Instructions

## 2023-03-03 ENCOUNTER — Other Ambulatory Visit: Payer: Self-pay | Admitting: Cardiology

## 2023-03-03 NOTE — Telephone Encounter (Signed)
 Prescription sent to pharmacy.

## 2023-06-17 ENCOUNTER — Encounter: Payer: Self-pay | Admitting: Cardiology

## 2023-06-17 ENCOUNTER — Ambulatory Visit: Attending: Cardiology | Admitting: Cardiology

## 2023-06-17 VITALS — BP 92/56 | HR 68 | Ht 66.0 in | Wt 205.0 lb

## 2023-06-17 DIAGNOSIS — I10 Essential (primary) hypertension: Secondary | ICD-10-CM | POA: Diagnosis present

## 2023-06-17 DIAGNOSIS — I2510 Atherosclerotic heart disease of native coronary artery without angina pectoris: Secondary | ICD-10-CM | POA: Insufficient documentation

## 2023-06-17 DIAGNOSIS — E785 Hyperlipidemia, unspecified: Secondary | ICD-10-CM | POA: Diagnosis present

## 2023-06-17 DIAGNOSIS — J410 Simple chronic bronchitis: Secondary | ICD-10-CM | POA: Insufficient documentation

## 2023-06-17 MED ORDER — EZETIMIBE 10 MG PO TABS: 10.0000 mg | ORAL_TABLET | Freq: Every day | ORAL | 3 refills | Status: DC

## 2023-06-17 NOTE — Progress Notes (Signed)
 Cardiology Office Note:    Date:  06/17/2023   ID:  [name], DOB 06/24/1960, MRN [id number]  PCP:  Freida Jes, FNP  Cardiologist:  Ralene Burger, MD    Referring MD: Freida Jes, FNP   No chief complaint on file.   History of Present Illness:    [name] is a 63 y.o. female past medical history significant for coronary artery disease status post PTCA and drug-eluting stent to mid LAD in 2023, obstructive sleep apnea, COPD, quit smoking 15 years ago, GERD, IBS, diabetes type 2, hypothyroidism, fibromyalgia.  Comes today 2 months for follow-up cardiac wise doing well denies have any chest pain tightness squeezing pressure burning chest, she does what she wants with no difficulties.  Still some shortness of breath with exertion which has not changed  Past Medical History:  Diagnosis Date   Abnormal stress test anterior wall ischemia 12/23/2021   Acquired hypothyroidism 08/04/2019   Allergic rhinitis    Ankle contracture, left 04/15/2017   Anxiety    Arthritis 04/28/2013   Arthritis of left acromioclavicular joint 08/11/2018   Asthma    Bilateral foot pain 04/15/2017   CAD (coronary artery disease)    a. 11/2021 MV: Ant ischemia. EF 52%; b. 11/2021 PCI: LAD 18m (2.25x20 Synergy XD DES), D2 60 (PTCA), LCX 30p/m, 83m/d, OM2 30, RCA large, 20p/d.   Chronic lower back pain    Chronic pain syndrome    followed by pain clinic [facility] in [place]   Complete tear of left rotator cuff 08/11/2018   COPD (chronic obstructive pulmonary disease) (HCC)    Coronary artery disease 12/26/2021   DDD (degenerative disc disease), cervical    DDD (degenerative disc disease), lumbar    Depression    Diabetes (HCC) 04/28/2013   Diabetic peripheral neuropathy (HCC)    hands and feet   Diabetic polyneuropathy associated with type 2 diabetes mellitus (HCC) 05/06/2016   Last Assessment & Plan:   Formatting of this note might be different from  the original.  Wound inspected still having pain but soft tissues do appear to be improving no gross purulent drainage noted continue IV antibiotics and adjust as per cultures  +     Dizziness 04/13/2020   Dyslipidemia 04/03/2022   Elevated coronary artery calcium  score 12/13/2021   Essential (primary) hypertension    Essential hypertension 08/04/2019   Last Assessment & Plan:   Formatting of this note might be different from the original.  Patient's the stable okay for discharge on Monday on oral antibiotics if okay with Infectious Disease     Fibromyalgia    Flexor tenosynovitis of finger 08/04/2019   Last Assessment & Plan:   Formatting of this note might be different from the original.  She had developed a complicated soft tissue infection and tendon sheath infection in the region of the right index finger after sustaining an injury while handling catfish. The patient had surgical debridement on 7/8 and initial intraoperative cultures positive for Edwardsiella, Aeromonas, morganella, staph au   Full dentures    only wears upper   GERD without esophagitis    Glenoid labral tear, left, initial encounter 08/11/2018   Hammer toe of left foot 05/06/2016   History of 2019 novel coronavirus disease (COVID-19)    08-10-2018  per pt in May 2020,  recovered at home without supplemental oxygen, never admitted to hospital,  residual nasal congestion and loss of taste is coming back , no cough sore throat sob or  difficultly breathing,  Negative covid test done 08-07-2018   History of pyelonephritis 09/2017   Hyperlipidemia    Hypothyroidism    IBS (irritable bowel syndrome)    no current med.   Impingement syndrome of left shoulder 08/11/2018   Left shoulder pain    Lumbar nerve root impingement    Lumbar post-laminectomy syndrome    Major depressive disorder    Metatarsalgia, right foot 05/06/2016   Obesity    Obstructive sleep apnea 04/02/2011   OSA (obstructive sleep apnea)    08-10-2018   per pt has used in 8 months   Osteoarthritis    Other insomnia 04/13/2020   Polyneuropathy    Postlaminectomy syndrome, lumbar region 04/28/2013   Last Assessment & Plan:   Formatting of this note might be different from the original.  Wound is much less angry finger still swollen no ascending evidence of cellulitis had digital motion limited but not a.  Clinically I think there is good progress on the IV antibiotics.  Will consider possible I and D but continue to monitor progress and if improvement continues would hold off on any immediate   Preop cardiovascular exam 05/02/2019   Primary osteoarthritis of left hip 07/10/2014   Primary osteoarthritis of right hip 10/20/2022   PTSD (post-traumatic stress disorder)    Pyelonephritis 10/07/2017   Right arm pain 04/12/2015   RLS (restless legs syndrome) 04/02/2016   Tinnitus of both ears 04/13/2020   Type 2 diabetes mellitus with obesity (HCC) 08/04/2019   Last Assessment & Plan:   Formatting of this note might be different from the original.  Agree with current plan currently awaiting cultures  IMO SNOMED Dx Update Oct 2024     Type II diabetes mellitus (HCC)    followed by pcp   Unstable angina (HCC) 12/26/2021   Vitamin D deficiency    Wears glasses     Past Surgical History:  Procedure Laterality Date   BLADDER SUSPENSION  2006   CARPAL TUNNEL RELEASE Bilateral right 07/22/2001;  left 01-05-2003  dr Abigail Abler @[facility]    CHOLECYSTECTOMY     COLONOSCOPY W/ POLYPECTOMY     CORONARY PRESSURE/FFR STUDY N/A 12/26/2021   Procedure: INTRAVASCULAR PRESSURE WIRE/FFR STUDY;  Surgeon: Odie Benne, MD;  Location: [facility];  Service: Cardiovascular;  Laterality: N/A;   CORONARY STENT INTERVENTION N/A 12/26/2021   Procedure: CORONARY STENT INTERVENTION;  Surgeon: Odie Benne, MD;  Location: [facility];  Service: Cardiovascular;  Laterality: N/A;   DILATION AND CURETTAGE OF UTERUS     LAPAROSCOPIC  CHOLECYSTECTOMY  1992   LEFT HEART CATH AND CORONARY ANGIOGRAPHY N/A 12/26/2021   Procedure: LEFT HEART CATH AND CORONARY ANGIOGRAPHY;  Surgeon: Odie Benne, MD;  Location: [facility];  Service: Cardiovascular;  Laterality: N/A;   LUMBAR LAMINECTOMY  1997   L4 --5   MULTIPLE EXTRACTIONS WITH ALVEOLOPLASTY N/A 11/26/2012   Procedure: MULTIPLE EXTRACTION WITH ALVEOLOPLASTY;  Surgeon: Cornelia Dieter, DDS;  Location: [facility];  Service: Oral Surgery;  Laterality: N/A;   SHOULDER ARTHROSCOPY WITH ROTATOR CUFF REPAIR AND SUBACROMIAL DECOMPRESSION Left 08/11/2018   Procedure: SHOULDER ARTHROSCOPY WITH ROTATOR CUFF REPAIR AND SUBACROMIAL DECOMPRESSION and DISTAL CLAVICLE ,DEBRIDEMENT ANTERIOR LABRIL TEAR;  Surgeon: Neil Balls, MD;  Location: [facility];  Service: Orthopedics;  Laterality: Left;   TONSILLECTOMY  age 87   TOTAL HIP ARTHROPLASTY Left 07/10/2014   Procedure: TOTAL HIP ARTHROPLASTY ANTERIOR APPROACH;  Surgeon: Neil Balls, MD;  Location: [facility];  Service: Orthopedics;  Laterality: Left;   TOTAL HIP ARTHROPLASTY Right 10/20/2022   Procedure: TOTAL HIP ARTHROPLASTY ANTERIOR APPROACH;  Surgeon: Neil Balls, MD;  Location: [facility];  Service: Orthopedics;  Laterality: Right;   TRIGGER FINGER RELEASE Right 2018   thumb   TRIGGER FINGER RELEASE Left 10/22/2015   Procedure: LEFT TRIGGER THUMB RELEASE;  Surgeon: Rober Chimera, MD;  Location: [facility];  Service: Orthopedics;  Laterality: Left;   TUBAL LIGATION  yrs ago   ULNAR NERVE TRANSPOSITION Right 03/27/2014   Procedure: RIGHT ULNAR NEUROPLASTY AT THE ELBOW;  Surgeon: Sheryl Donna, MD;  Location: [facility];  Service: Orthopedics;  Laterality: Right;   ULNAR NERVE TRANSPOSITION Left 12/08/2016   Procedure: LEFT ULNAR NEUROPLASTY AT THE ELBOW;  Surgeon: Rober Chimera, MD;  Location: [facility];  Service: Orthopedics;  Laterality: Left;   UPPER GASTROINTESTINAL  ENDOSCOPY     VAGINAL HYSTERECTOMY  2006   w/  ANTERIOR AND POSTERIOR REPAIR WITH SLING SUSPENSION    Current Medications: Current Meds  Medication Sig   albuterol  (PROVENTIL  HFA;VENTOLIN  HFA) 108 (90 Base) MCG/ACT inhaler Inhale 2 puffs into the lungs every 6 (six) hours as needed for wheezing or shortness of breath.   albuterol  (PROVENTIL ) (2.5 MG/3ML) 0.083% nebulizer solution Take 2.5 mg by nebulization every 4 (four) hours as needed for wheezing or shortness of breath.   aspirin  EC 81 MG tablet Take 1 tablet (81 mg total) by mouth every 12 (twelve) hours. Take x 1 month postop to decrease risk of blood clots.   atorvastatin  (LIPITOR) 80 MG tablet Take 1 tablet (80 mg total) by mouth daily.   celecoxib  (CELEBREX ) 200 MG capsule Take 1 capsule (200 mg total) by mouth 2 (two) times daily.   cyclobenzaprine  (FLEXERIL ) 10 MG tablet Take 1 tablet (10 mg total) by mouth 3 (three) times daily as needed for muscle spasms.   FLUoxetine  (PROZAC ) 20 MG capsule Take 20 mg by mouth See admin instructions. Take with 40 mg for a total of 60 mg daily   FLUoxetine  (PROZAC ) 40 MG capsule Take 40 mg by mouth See admin instructions. Take with 20 mg for a total of 60 mg daily   fluticasone  (FLONASE ) 50 MCG/ACT nasal spray Place 2 sprays into both nostrils daily as needed for allergies.   glipiZIDE (GLUCOTROL) 5 MG tablet Take 5 mg by mouth every morning.   glucose blood test strip 1 each by Other route 2 (two) times daily.   HYDROcodone -acetaminophen  (NORCO) 10-325 MG tablet Take 1 tablet by mouth 2 (two) times daily as needed for moderate pain.   levothyroxine  (SYNTHROID ) 75 MCG tablet Take 75 mcg by mouth daily before breakfast.   loratadine (CLARITIN) 10 MG tablet Take 10 mg by mouth at bedtime.   morphine  (MS CONTIN ) 15 MG 12 hr tablet Take 15 mg by mouth every 8 (eight) hours.   naloxone (NARCAN) nasal spray 4 mg/0.1 mL Place 1 spray into the nose as needed (for accidental overdose).   omega-3 acid ethyl  esters (LOVAZA) 1 g capsule Take 1 capsule by mouth 2 (two) times daily.   OZEMPIC, 2 MG/DOSE, 8 MG/3ML SOPN Inject 2 mg into the skin once a week.   pregabalin  (LYRICA ) 200 MG capsule Take 200 mg by mouth 3 (three) times daily.   topiramate (TOPAMAX) 25 MG tablet Take 25 mg by mouth 2 (two) times daily.   TRELEGY ELLIPTA 100-62.5-25 MCG/ACT AEPB Inhale 1  puff into the lungs daily.   zolpidem  (AMBIEN ) 10 MG tablet Take 10 mg by mouth at bedtime.     Allergies:   Adhesive [tape], Latex, Dilaudid  [hydromorphone ], and Oxycodone    Social History   Socioeconomic History   Marital status: Divorced    Spouse name: Not on file   Number of children: Not on file   Years of education: Not on file   Highest education level: Not on file  Occupational History   Not on file  Tobacco Use   Smoking status: Former    Current packs/day: 0.00    Average packs/day: 1.5 packs/day for 30.0 years (45.0 ttl pk-yrs)    Types: Cigarettes    Start date: 11/30/1977    Quit date: 12/01/2007    Years since quitting: 15.5   Smokeless tobacco: Never  Vaping Use   Vaping status: Never Used  Substance and Sexual Activity   Alcohol use: Not Currently   Drug use: Never   Sexual activity: Yes    Birth control/protection: Surgical  Other Topics Concern   Not on file  Social History Narrative   Not on file   Social Drivers of Health   Financial Resource Strain: Not on file  Food Insecurity: Not on file  Transportation Needs: Not on file  Physical Activity: Not on file  Stress: Not on file  Social Connections: Not on file     Family History: The patient's family history includes Diabetes in her mother; Heart disease in her mother; Hypertension in her mother; Mental illness in her mother. ROS:   Please see the history of present illness.    All 14 point review of systems negative except as described per history of present illness  EKGs/Labs/Other Studies Reviewed:         Recent Labs: 10/07/2022:  BUN 12; Creatinine, Ser 0.72; Hemoglobin 12.5; Platelets 201; Potassium 4.2; Sodium 141  Recent Lipid Panel No results found for: "CHOL", "TRIG", "HDL", "CHOLHDL", "VLDL", "LDLCALC", "LDLDIRECT"  Physical Exam:    VS:  BP (!) 92/56   Pulse 68   Ht 5\' 6"  (1.676 m)   Wt 205 lb (93 kg)   SpO2 97%   BMI 33.09 kg/m     Wt Readings from Last 3 Encounters:  06/17/23 205 lb (93 kg)  02/12/23 209 lb (94.8 kg)  10/20/22 216 lb (98 kg)     GEN:  Well nourished, well developed in no acute distress HEENT: Normal NECK: No JVD; No carotid bruits LYMPHATICS: No lymphadenopathy CARDIAC: RRR, no murmurs, no rubs, no gallops RESPIRATORY:  Clear to auscultation without rales, wheezing or rhonchi  ABDOMEN: Soft, non-tender, non-distended MUSCULOSKELETAL:  No edema; No deformity  SKIN: Warm and dry LOWER EXTREMITIES: no swelling NEUROLOGIC:  Alert and oriented x 3 PSYCHIATRIC:  Normal affect   ASSESSMENT:    1. Coronary artery disease involving native coronary artery of native heart without angina pectoris   2. Essential hypertension   3. Simple chronic bronchitis (HCC)   4. Dyslipidemia    PLAN:    In order of problems listed above:  Coronary disease stable from that point review on guideline directed medical therapy which I will continue. Dyslipidemia I did review K PN which show me her LDL of 75 HDL 45 clinically not well-controlled I would like to have target LDL less than 55.  Will add Zetia to her medical regimen.  She takes Lovaza and Lipitor will continue. Diabetes mellitus followed by internal medicine team, her hemoglobin A1c  8.1 this is from January clearly poorly controlled.  I gave her big dog how important is for her to take care of her diabetes she will talk to her primary care physician to get a glucometer so she can follow her sugar. COPD.  Stable.  She is abstain from smoking and I encouraged her to stay away from this bad habit   Medication Adjustments/Labs and Tests  Ordered: Current medicines are reviewed at length with the patient today.  Concerns regarding medicines are outlined above.  No orders of the defined types were placed in this encounter.  Medication changes: No orders of the defined types were placed in this encounter.   Signed, Manfred Seed, MD, [facility] 06/17/2023 10:05 AM    [facility]

## 2023-06-17 NOTE — Addendum Note (Signed)
 Addended by: Shawnee Dellen D on: 06/17/2023 10:13 AM   Modules accepted: Orders

## 2023-06-17 NOTE — Patient Instructions (Addendum)
Medication Instructions:   START: Zetia 10mg  1 tablet daily   Lab Work: Your physician recommends that you return for lab work in: 6 weeks You need to have labs done when you are fasting.  You can come Monday through Friday 8:30 am to 12:00 pm and 1:15 to 4:30. You do not need to make an appointment as the order has already been placed. The labs you are going to have done are AST, ALT Lipids.    Testing/Procedures: None Ordered   Follow-Up: At [facility], you and your health needs are our priority.  As part of our continuing mission to provide you with exceptional heart care, we have created designated Provider Care Teams.  These Care Teams include your primary Cardiologist (physician) and Advanced Practice Providers (APPs -  Physician Assistants and Nurse Practitioners) who all work together to provide you with the care you need, when you need it.  We recommend signing up for the patient portal called "MyChart".  Sign up information is provided on this After Visit Summary.  MyChart is used to connect with patients for Virtual Visits (Telemedicine).  Patients are able to view lab/test results, encounter notes, upcoming appointments, etc.  Non-urgent messages can be sent to your provider as well.   To learn more about what you can do with MyChart, go to [email or web address].    Your next appointment:   6 month(s)  The format for your next appointment:   In Person  Provider:   Gypsy Balsam, MD    Other Instructions NA

## 2023-07-08 ENCOUNTER — Ambulatory Visit (INDEPENDENT_AMBULATORY_CARE_PROVIDER_SITE_OTHER): Admitting: Podiatry

## 2023-07-08 ENCOUNTER — Ambulatory Visit (INDEPENDENT_AMBULATORY_CARE_PROVIDER_SITE_OTHER)

## 2023-07-08 DIAGNOSIS — M778 Other enthesopathies, not elsewhere classified: Secondary | ICD-10-CM

## 2023-07-08 DIAGNOSIS — M7751 Other enthesopathy of right foot: Secondary | ICD-10-CM | POA: Diagnosis not present

## 2023-07-08 DIAGNOSIS — M7752 Other enthesopathy of left foot: Secondary | ICD-10-CM

## 2023-07-08 DIAGNOSIS — M722 Plantar fascial fibromatosis: Secondary | ICD-10-CM | POA: Diagnosis not present

## 2023-07-08 DIAGNOSIS — M2042 Other hammer toe(s) (acquired), left foot: Secondary | ICD-10-CM | POA: Diagnosis not present

## 2023-07-08 MED ORDER — METHYLPREDNISOLONE 4 MG PO TBPK: ORAL_TABLET | ORAL | 0 refills | Status: DC

## 2023-07-08 NOTE — Patient Instructions (Signed)

## 2023-07-08 NOTE — Progress Notes (Signed)
 Chief Complaint  Patient presents with   Foot Pain    Bilateral plantar pain from the ball of the foot through the heel. The left is the worst, with the 5th toe having a calloused area that is painful. Last A1c 8 in May, ASA   HPI: 63 y.o. female presenting today with c/o pain in the bottom of the bilateral foot.  She has inserts from the [employer] and states that they have not helped.  Her pain on the bottom of both feet has been going on for a while.  She states that it started in the ball of the foot but now is extending all the way back to the plantar heel bilateral.  States the right is worse than the left.  She is not open to a cortisone injection today.  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  She does not always wear shoes at home.  Patient presents with concern of a painful corn on the left fifth toe.  She underwent previous surgery on this toe to prevent a corn that had been recurring.  She notes that she did well initially, but now the corn has returned on the toe and the toe is still rotated.  Past Medical History:  Diagnosis Date   Abnormal stress test anterior wall ischemia 12/23/2021   Acquired hypothyroidism 08/04/2019   Allergic rhinitis    Ankle contracture, left 04/15/2017   Anxiety    Arthritis 04/28/2013   Arthritis of left acromioclavicular joint 08/11/2018   Asthma    Bilateral foot pain 04/15/2017   CAD (coronary artery disease)    a. 11/2021 MV: Ant ischemia. EF 52%; b. 11/2021 PCI: LAD 68m (2.25x20 Synergy XD DES), D2 60 (PTCA), LCX 30p/m, 52m/d, OM2 30, RCA large, 20p/d.   Chronic lower back pain    Chronic pain syndrome    followed by pain clinic [facility] in [place]   Complete tear of left rotator cuff 08/11/2018   COPD (chronic obstructive pulmonary disease) (HCC)    Coronary artery disease 12/26/2021   DDD (degenerative disc disease), cervical    DDD (degenerative disc disease), lumbar    Depression     Diabetes (HCC) 04/28/2013   Diabetic peripheral neuropathy (HCC)    hands and feet   Diabetic polyneuropathy associated with type 2 diabetes mellitus (HCC) 05/06/2016   Last Assessment & Plan:   Formatting of this note might be different from the original.  Wound inspected still having pain but soft tissues do appear to be improving no gross purulent drainage noted continue IV antibiotics and adjust as per cultures  +     Dizziness 04/13/2020   Dyslipidemia 04/03/2022   Elevated coronary artery calcium  score 12/13/2021   Essential (primary) hypertension    Essential hypertension 08/04/2019   Last Assessment & Plan:   Formatting of this note might be different from the original.  Patient's the stable okay for discharge on Monday on oral antibiotics if okay with Infectious Disease     Fibromyalgia    Flexor tenosynovitis of finger 08/04/2019   Last Assessment & Plan:   Formatting of this note might be different from the original.  She had developed a complicated soft tissue infection and tendon sheath infection in the region of the right index finger after sustaining an injury while handling catfish. The patient had surgical debridement on 7/8 and initial intraoperative cultures positive for Edwardsiella, Aeromonas, morganella, staph au  Full dentures    only wears upper   GERD without esophagitis    Glenoid labral tear, left, initial encounter 08/11/2018   Hammer toe of left foot 05/06/2016   History of 2019 novel coronavirus disease (COVID-19)    08-10-2018  per pt in May 2020,  recovered at home without supplemental oxygen, never admitted to hospital,  residual nasal congestion and loss of taste is coming back , no cough sore throat sob or difficultly breathing,  Negative covid test done 08-07-2018   History of pyelonephritis 09/2017   Hyperlipidemia    Hypothyroidism    IBS (irritable bowel syndrome)    no current med.   Impingement syndrome of left shoulder 08/11/2018   Left shoulder  pain    Lumbar nerve root impingement    Lumbar post-laminectomy syndrome    Major depressive disorder    Metatarsalgia, right foot 05/06/2016   Obesity    Obstructive sleep apnea 04/02/2011   OSA (obstructive sleep apnea)    08-10-2018  per pt has used in 8 months   Osteoarthritis    Other insomnia 04/13/2020   Polyneuropathy    Postlaminectomy syndrome, lumbar region 04/28/2013   Last Assessment & Plan:   Formatting of this note might be different from the original.  Wound is much less angry finger still swollen no ascending evidence of cellulitis had digital motion limited but not a.  Clinically I think there is good progress on the IV antibiotics.  Will consider possible I and D but continue to monitor progress and if improvement continues would hold off on any immediate   Preop cardiovascular exam 05/02/2019   Primary osteoarthritis of left hip 07/10/2014   Primary osteoarthritis of right hip 10/20/2022   PTSD (post-traumatic stress disorder)    Pyelonephritis 10/07/2017   Right arm pain 04/12/2015   RLS (restless legs syndrome) 04/02/2016   Tinnitus of both ears 04/13/2020   Type 2 diabetes mellitus with obesity (HCC) 08/04/2019   Last Assessment & Plan:   Formatting of this note might be different from the original.  Agree with current plan currently awaiting cultures  IMO SNOMED Dx Update Oct 2024     Type II diabetes mellitus (HCC)    followed by pcp   Unstable angina (HCC) 12/26/2021   Vitamin D deficiency    Wears glasses    Past Surgical History:  Procedure Laterality Date   BLADDER SUSPENSION  2006   CARPAL TUNNEL RELEASE Bilateral right 07/22/2001;  left 01-05-2003  dr Abigail Abler @[facility]    CHOLECYSTECTOMY     COLONOSCOPY W/ POLYPECTOMY     CORONARY PRESSURE/FFR STUDY N/A 12/26/2021   Procedure: INTRAVASCULAR PRESSURE WIRE/FFR STUDY;  Surgeon: Odie Benne, MD;  [facility]: [facility];  Service: Cardiovascular;  Laterality: N/A;   CORONARY STENT  INTERVENTION N/A 12/26/2021   Procedure: CORONARY STENT INTERVENTION;  Surgeon: Odie Benne, MD;  [facility]: [facility];  Service: Cardiovascular;  Laterality: N/A;   DILATION AND CURETTAGE OF UTERUS     LAPAROSCOPIC CHOLECYSTECTOMY  1992   LEFT HEART CATH AND CORONARY ANGIOGRAPHY N/A 12/26/2021   Procedure: LEFT HEART CATH AND CORONARY ANGIOGRAPHY;  Surgeon: Odie Benne, MD;  [facility];  Service: Cardiovascular;  Laterality: N/A;   LUMBAR LAMINECTOMY  1997   L4 --5   MULTIPLE EXTRACTIONS WITH ALVEOLOPLASTY N/A 11/26/2012   Procedure: MULTIPLE EXTRACTION WITH ALVEOLOPLASTY;  Surgeon: Cornelia Dieter, DDS;  Location: [facility];  Service: Oral Surgery;  Laterality: N/A;   SHOULDER ARTHROSCOPY WITH ROTATOR CUFF REPAIR AND SUBACROMIAL DECOMPRESSION Left 08/11/2018   Procedure: SHOULDER ARTHROSCOPY WITH ROTATOR CUFF REPAIR AND SUBACROMIAL DECOMPRESSION and DISTAL CLAVICLE ,DEBRIDEMENT ANTERIOR LABRIL TEAR;  Surgeon: Neil Balls, MD;  Location: [facility];  Service: Orthopedics;  Laterality: Left;   TONSILLECTOMY  age 47   TOTAL HIP ARTHROPLASTY Left 07/10/2014   Procedure: TOTAL HIP ARTHROPLASTY ANTERIOR APPROACH;  Surgeon: Neil Balls, MD;  Location: [facility];  Service: Orthopedics;  Laterality: Left;   TOTAL HIP ARTHROPLASTY Right 10/20/2022   Procedure: TOTAL HIP ARTHROPLASTY ANTERIOR APPROACH;  Surgeon: Neil Balls, MD;  Location: [facility];  Service: Orthopedics;  Laterality: Right;   TRIGGER FINGER RELEASE Right 2018   thumb   TRIGGER FINGER RELEASE Left 10/22/2015   Procedure: LEFT TRIGGER THUMB RELEASE;  Surgeon: Rober Chimera, MD;  Location: [facility];  Service: Orthopedics;  Laterality: Left;   TUBAL LIGATION  yrs ago   ULNAR NERVE TRANSPOSITION Right 03/27/2014   Procedure: RIGHT ULNAR NEUROPLASTY AT THE ELBOW;  Surgeon: Sheryl Donna, MD;  Location: [facility];  Service: Orthopedics;  Laterality:  Right;   ULNAR NERVE TRANSPOSITION Left 12/08/2016   Procedure: LEFT ULNAR NEUROPLASTY AT THE ELBOW;  Surgeon: Rober Chimera, MD;  Location: [facility];  Service: Orthopedics;  Laterality: Left;   UPPER GASTROINTESTINAL ENDOSCOPY     VAGINAL HYSTERECTOMY  2006   w/  ANTERIOR AND POSTERIOR REPAIR WITH SLING SUSPENSION   Allergies  Allergen Reactions   Adhesive [Tape] Hives   Latex Hives   Dilaudid  [Hydromorphone ] Hives   Oxycodone  Itching     Physical Exam: General: The patient is alert and oriented x3 in no acute distress.  Dermatology:  No ecchymosis, erythema, or edema bilateral.  No open lesions.  Small focal hyperkeratotic lesion on the dorsal lateral aspect of left fifth toe at the PIPJ  Vascular: Palpable pedal pulses bilaterally. Capillary refill within normal limits.  No appreciable edema.    Neurological: Epicritic sensation is intact  Musculoskeletal Exam:  There is pain on palpation of the plantarmedial & plantarcentral aspect of bilateral heel.  No gaps or nodules within the plantar fascia.  Positive Windlass mechanism bilateral.  Antalgic gait noted with first few steps upon standing.  No pain on palpation of achilles tendon bilateral.  Ankle df less than 10 degrees with knee extended b/l.  There is mild adduction and varus rotation to the left fifth toe.  Radiographic Exam (bilateral foot, 3 weightbearing views, 07/08/2023):  Normal osseous mineralization. Joint spaces preserved.  No fractures noted.  Small inferior calcaneal spurs noted.  Evidence of previous left fifth toe DIPJ arthroplasty.  There is still some medial angulation of the distal phalanx.  Assessment/Plan of Care: 1. Plantar fasciitis of right foot   2. Capsulitis of right foot   3. Capsulitis of left foot   4. Plantar fasciitis of left foot   5. Hammertoe of left foot     Meds ordered this encounter  Medications   methylPREDNISolone  (MEDROL  DOSEPAK) 4 MG TBPK tablet    Sig: Take as  directed    Dispense:  21 tablet    Refill:  0   -Reviewed etiology of plantar fasciitis with patient.  Discussed treatment options with patient today, including cortisone injection, NSAID course of treatment, stretching exercises, physical therapy, use of night splint, rest, icing the heel, arch supports/orthotics, and supportive shoe gear.    The corn on the  dorsal lateral aspect of the left fifth toe PIPJ.  Toe pads were also dispensed to keep pressure off the area.  Discussed shoe fit.  She had previous surgery on this toe but would like to hold off on revisional surgery at this time.  Night splint fitted and dispensed today.  This is a static AFO device with soft interface material to be worn when sleeping or nonweightbearing.  Plantar fascia brace fitted and dispensed today.  The proof of delivery and insurance waiver were signed via motion MD today.  Stretching exercises printed and dispensed at checkout  Prescription for Medrol  Dosepak was sent to the patient's pharmacy for pain and inflammation management.  Powerstep inserts recommended, but declined by patient  Patient declined a cortisone injection today.  Return in about 4 weeks (around 08/05/2023) for f/u plantar fasciitis.   Joe Murders, DPM, FACFAS [facility]     [address] [place], [place] [address]                Office [phone]  Fax [phone]

## 2023-07-30 ENCOUNTER — Ambulatory Visit: Admitting: Podiatry

## 2023-08-13 ENCOUNTER — Ambulatory Visit: Admitting: Podiatry

## 2023-08-13 DIAGNOSIS — M722 Plantar fascial fibromatosis: Secondary | ICD-10-CM

## 2023-08-13 NOTE — Progress Notes (Signed)
 Chief Complaint  Patient presents with   Plantar Fasciitis    Right foot is about the same as before. Pain is primarily located in the right heel. She is following all discharge instructions with little to no relief. Completed dosepak. Left foot is not giving her any issues currently.    HPI: 63 y.o. female presenting today for follow-up of plantar fasciitis.  Not having any pain in the left heel today.  Her left fifth toe corn looks good.   Right plantar heel has been killing her.  States been throbbing.  States that stretching exercises have been significantly uncomfortable.  Informed her that she most likely has bursitis in the heel.  She was very hesitant about a cortisone injection but did give permission for one today.  She cannot do oral prednisone due to side effects.   Past Medical History:  Diagnosis Date   Abnormal stress test anterior wall ischemia 12/23/2021   Acquired hypothyroidism 08/04/2019   Allergic rhinitis    Ankle contracture, left 04/15/2017   Anxiety    Arthritis 04/28/2013   Arthritis of left acromioclavicular joint 08/11/2018   Asthma    Bilateral foot pain 04/15/2017   CAD (coronary artery disease)    a. 11/2021 MV: Ant ischemia. EF 52%; b. 11/2021 PCI: LAD 22m (2.25x20 Synergy XD DES), D2 60 (PTCA), LCX 30p/m, 25m/d, OM2 30, RCA large, 20p/d.   Chronic lower back pain    Chronic pain syndrome    followed by pain clinic [facility] in [place]   Complete tear of left rotator cuff 08/11/2018   COPD (chronic obstructive pulmonary disease) (HCC)    Coronary artery disease 12/26/2021   DDD (degenerative disc disease), cervical    DDD (degenerative disc disease), lumbar    Depression    Diabetes (HCC) 04/28/2013   Diabetic peripheral neuropathy (HCC)    hands and feet   Diabetic polyneuropathy associated with type 2 diabetes mellitus (HCC) 05/06/2016   Last Assessment & Plan:   Formatting of this note might be different from the original.  Wound  inspected still having pain but soft tissues do appear to be improving no gross purulent drainage noted continue IV antibiotics and adjust as per cultures  +     Dizziness 04/13/2020   Dyslipidemia 04/03/2022   Elevated coronary artery calcium  score 12/13/2021   Essential (primary) hypertension    Essential hypertension 08/04/2019   Last Assessment & Plan:   Formatting of this note might be different from the original.  Patient's the stable okay for discharge on Monday on oral antibiotics if okay with Infectious Disease     Fibromyalgia    Flexor tenosynovitis of finger 08/04/2019   Last Assessment & Plan:   Formatting of this note might be different from the original.  She had developed a complicated soft tissue infection and tendon sheath infection in the region of the right index finger after sustaining an injury while handling catfish. The patient had surgical debridement on 7/8 and initial intraoperative cultures positive for Edwardsiella, Aeromonas, morganella, staph au   Full dentures    only wears upper   GERD without esophagitis    Glenoid labral tear, left, initial encounter 08/11/2018   Hammer toe of left foot 05/06/2016   History of 2019 novel coronavirus disease (COVID-19)    08-10-2018  per pt in May 2020,  recovered at home without supplemental oxygen, never admitted to hospital,  residual nasal congestion and loss of taste is coming back ,  no cough sore throat sob or difficultly breathing,  Negative covid test done 08-07-2018   History of pyelonephritis 09/2017   Hyperlipidemia    Hypothyroidism    IBS (irritable bowel syndrome)    no current med.   Impingement syndrome of left shoulder 08/11/2018   Left shoulder pain    Lumbar nerve root impingement    Lumbar post-laminectomy syndrome    Major depressive disorder    Metatarsalgia, right foot 05/06/2016   Obesity    Obstructive sleep apnea 04/02/2011   OSA (obstructive sleep apnea)    08-10-2018  per pt has used in 8  months   Osteoarthritis    Other insomnia 04/13/2020   Polyneuropathy    Postlaminectomy syndrome, lumbar region 04/28/2013   Last Assessment & Plan:   Formatting of this note might be different from the original.  Wound is much less angry finger still swollen no ascending evidence of cellulitis had digital motion limited but not a.  Clinically I think there is good progress on the IV antibiotics.  Will consider possible I and D but continue to monitor progress and if improvement continues would hold off on any immediate   Preop cardiovascular exam 05/02/2019   Primary osteoarthritis of left hip 07/10/2014   Primary osteoarthritis of right hip 10/20/2022   PTSD (post-traumatic stress disorder)    Pyelonephritis 10/07/2017   Right arm pain 04/12/2015   RLS (restless legs syndrome) 04/02/2016   Tinnitus of both ears 04/13/2020   Type 2 diabetes mellitus with obesity (HCC) 08/04/2019   Last Assessment & Plan:   Formatting of this note might be different from the original.  Agree with current plan currently awaiting cultures  IMO SNOMED Dx Update Oct 2024     Type II diabetes mellitus (HCC)    followed by pcp   Unstable angina (HCC) 12/26/2021   Vitamin D deficiency    Wears glasses    Past Surgical History:  Procedure Laterality Date   BLADDER SUSPENSION  2006   CARPAL TUNNEL RELEASE Bilateral right 07/22/2001;  left 01-05-2003  dr beverley @[facility]    CHOLECYSTECTOMY     COLONOSCOPY W/ POLYPECTOMY     CORONARY PRESSURE/FFR STUDY N/A 12/26/2021   Procedure: INTRAVASCULAR PRESSURE WIRE/FFR STUDY;  Surgeon: Verlin Lonni BIRCH, MD;  [facility];  Service: Cardiovascular;  Laterality: N/A;   CORONARY STENT INTERVENTION N/A 12/26/2021   Procedure: CORONARY STENT INTERVENTION;  Surgeon: Verlin Lonni BIRCH, MD;  [facility];  Service: Cardiovascular;  Laterality: N/A;   DILATION AND CURETTAGE OF UTERUS     LAPAROSCOPIC CHOLECYSTECTOMY  1992   LEFT HEART  CATH AND CORONARY ANGIOGRAPHY N/A 12/26/2021   Procedure: LEFT HEART CATH AND CORONARY ANGIOGRAPHY;  Surgeon: Verlin Lonni BIRCH, MD;  [facility];  Service: Cardiovascular;  Laterality: N/A;   LUMBAR LAMINECTOMY  1997   L4 --5   MULTIPLE EXTRACTIONS WITH ALVEOLOPLASTY N/A 11/26/2012   Procedure: MULTIPLE EXTRACTION WITH ALVEOLOPLASTY;  Surgeon: Glendia CHRISTELLA Primrose, DDS;  Location: [facility];  Service: Oral Surgery;  Laterality: N/A;   SHOULDER ARTHROSCOPY WITH ROTATOR CUFF REPAIR AND SUBACROMIAL DECOMPRESSION Left 08/11/2018   Procedure: SHOULDER ARTHROSCOPY WITH ROTATOR CUFF REPAIR AND SUBACROMIAL DECOMPRESSION and DISTAL CLAVICLE ,DEBRIDEMENT ANTERIOR LABRIL TEAR;  Surgeon: Yvone Rush, MD;  Location[facility];  Service: Orthopedics;  Laterality: Left;   TONSILLECTOMY  age 72   TOTAL HIP ARTHROPLASTY Left 07/10/2014   Procedure: TOTAL HIP ARTHROPLASTY ANTERIOR APPROACH;  Surgeon: Norleen Gavel, MD;  Location: [facility];  Service: Orthopedics;  Laterality: Left;   TOTAL HIP ARTHROPLASTY Right 10/20/2022   Procedure: TOTAL HIP ARTHROPLASTY ANTERIOR APPROACH;  Surgeon: Gavel Norleen, MD;  Location: [facility];  Service: Orthopedics;  Laterality: Right;   TRIGGER FINGER RELEASE Right 2018   thumb   TRIGGER FINGER RELEASE Left 10/22/2015   Procedure: LEFT TRIGGER THUMB RELEASE;  Surgeon: Alm Hummer, MD;  Location: [facility];  Service: Orthopedics;  Laterality: Left;   TUBAL LIGATION  yrs ago   ULNAR NERVE TRANSPOSITION Right 03/27/2014   Procedure: RIGHT ULNAR NEUROPLASTY AT THE ELBOW;  Surgeon: Alm DELENA Hummer, MD;  Location: [facility];  Service: Orthopedics;  Laterality: Right;   ULNAR NERVE TRANSPOSITION Left 12/08/2016   Procedure: LEFT ULNAR NEUROPLASTY AT THE ELBOW;  Surgeon: Hummer Alm, MD;  Location: [facility];  Service: Orthopedics;  Laterality: Left;   UPPER GASTROINTESTINAL ENDOSCOPY     VAGINAL HYSTERECTOMY   2006   w/  ANTERIOR AND POSTERIOR REPAIR WITH SLING SUSPENSION   Allergies  Allergen Reactions   Adhesive [Tape] Hives   Latex Hives   Dilaudid  [Hydromorphone ] Hives   Oxycodone  Itching     Physical Exam: General: The patient is alert and oriented x3 in no acute distress.  Dermatology:  No ecchymosis, erythema, or edema bilateral.  No open lesions.    Vascular: Palpable pedal pulses bilaterally. Capillary refill within normal limits.  No appreciable edema.    Neurological: Epicritic sensation is intact  Musculoskeletal Exam:  There is pain on palpation of the plantarmedial & plantarcentral aspect of right heel.  No gaps or nodules within the plantar fascia.  Positive Windlass mechanism bilateral.  Antalgic gait noted with first few steps upon standing.  No pain on palpation of achilles tendon bilateral.  Ankle df less than 10 degrees with knee extended b/l.  Assessment/Plan of Care: 1. Plantar fasciitis of left foot   2. Plantar fasciitis of right foot     -Reviewed etiology of plantar fasciitis with patient.  Discussed treatment options with patient today, including cortisone injection, NSAID course of treatment, stretching exercises, physical therapy, use of night splint, rest, icing the heel, arch supports/orthotics, and supportive shoe gear.    With the patient's verbal consent, a corticosteroid injection was administered to the right heel, consisting of a mixture of 1% lidocaine  plain, 0.5% Sensorcaine  plain, and Kenalog-10 for a total of 1.5cc administered.  A Band-aid was applied. Pain level post-injection is 5/10.  Night splint fitted and dispensed today.  This is a static AFO device with soft interface material to be worn when sleeping or nonweightbearing.  She was post have been given 1 on her last visit but states she never was.  She was fitted for this today.  Stretching exercises to be continued in 5 days  Return in about 4 weeks (around 09/10/2023) for f/u plantar  fasciitis.   Awanda CHARM Imperial, DPM, FACFAS [facility]     [address] [place], [place] [address]                Office [phone]  Fax [phone]

## 2023-09-09 ENCOUNTER — Ambulatory Visit: Admitting: Podiatry

## 2023-09-09 DIAGNOSIS — M722 Plantar fascial fibromatosis: Secondary | ICD-10-CM

## 2023-09-09 NOTE — Progress Notes (Signed)
 Chief Complaint  Patient presents with   Plantar Fasciitis    Right foot  PF. Not really any better, inj no help at all. She has been using the night splint some, and the PF brace some but not all the time. The lateral side and the dead center are still really painful. She is up for trying another inj, she just hurt more after the last one and had no relief from it. A1c was 8.5 last Sept. Takes ASA   HPI: 63 y.o. female presents today for follow-up of right plantar fasciitis.  She states that she is not doing much better.  She states that she had some improvement for 1 to 2 days after the injection but then it started feeling worse.  Most of her pain is noted on the plantar central and plantar lateral portions of the heel today.  She has been occasionally wearing the night splint and the plantar fascial brace.  She notes she will try to to wear this more.  Pain today is 8/10.  Past Medical History:  Diagnosis Date   Abnormal stress test anterior wall ischemia 12/23/2021   Acquired hypothyroidism 08/04/2019   Allergic rhinitis    Ankle contracture, left 04/15/2017   Anxiety    Arthritis 04/28/2013   Arthritis of left acromioclavicular joint 08/11/2018   Asthma    Bilateral foot pain 04/15/2017   CAD (coronary artery disease)    a. 11/2021 MV: Ant ischemia. EF 52%; b. 11/2021 PCI: LAD 34m (2.25x20 Synergy XD DES), D2 60 (PTCA), LCX 30p/m, 75m/d, OM2 30, RCA large, 20p/d.   Chronic lower back pain    Chronic pain syndrome    followed by pain clinic [facility] in [place]   Complete tear of left rotator cuff 08/11/2018   COPD (chronic obstructive pulmonary disease) (HCC)    Coronary artery disease 12/26/2021   DDD (degenerative disc disease), cervical    DDD (degenerative disc disease), lumbar    Depression    Diabetes (HCC) 04/28/2013   Diabetic peripheral neuropathy (HCC)    hands and feet   Diabetic polyneuropathy associated with type 2 diabetes mellitus (HCC) 05/06/2016    Last Assessment & Plan:   Formatting of this note might be different from the original.  Wound inspected still having pain but soft tissues do appear to be improving no gross purulent drainage noted continue IV antibiotics and adjust as per cultures  +     Dizziness 04/13/2020   Dyslipidemia 04/03/2022   Elevated coronary artery calcium  score 12/13/2021   Essential (primary) hypertension    Essential hypertension 08/04/2019   Last Assessment & Plan:   Formatting of this note might be different from the original.  Patient's the stable okay for discharge on Monday on oral antibiotics if okay with Infectious Disease     Fibromyalgia    Flexor tenosynovitis of finger 08/04/2019   Last Assessment & Plan:   Formatting of this note might be different from the original.  She had developed a complicated soft tissue infection and tendon sheath infection in the region of the right index finger after sustaining an injury while handling catfish. The patient had surgical debridement on 7/8 and initial intraoperative cultures positive for Edwardsiella, Aeromonas, morganella, staph au   Full dentures    only wears upper   GERD without esophagitis    Glenoid labral tear, left, initial encounter 08/11/2018   Hammer toe of left foot 05/06/2016   History of 2019 novel coronavirus  disease (COVID-19)    08-10-2018  per pt in May 2020,  recovered at home without supplemental oxygen, never admitted to hospital,  residual nasal congestion and loss of taste is coming back , no cough sore throat sob or difficultly breathing,  Negative covid test done 08-07-2018   History of pyelonephritis 09/2017   Hyperlipidemia    Hypothyroidism    IBS (irritable bowel syndrome)    no current med.   Impingement syndrome of left shoulder 08/11/2018   Left shoulder pain    Lumbar nerve root impingement    Lumbar post-laminectomy syndrome    Major depressive disorder    Metatarsalgia, right foot 05/06/2016   Obesity    Obstructive  sleep apnea 04/02/2011   OSA (obstructive sleep apnea)    08-10-2018  per pt has used in 8 months   Osteoarthritis    Other insomnia 04/13/2020   Polyneuropathy    Postlaminectomy syndrome, lumbar region 04/28/2013   Last Assessment & Plan:   Formatting of this note might be different from the original.  Wound is much less angry finger still swollen no ascending evidence of cellulitis had digital motion limited but not a.  Clinically I think there is good progress on the IV antibiotics.  Will consider possible I and D but continue to monitor progress and if improvement continues would hold off on any immediate   Preop cardiovascular exam 05/02/2019   Primary osteoarthritis of left hip 07/10/2014   Primary osteoarthritis of right hip 10/20/2022   PTSD (post-traumatic stress disorder)    Pyelonephritis 10/07/2017   Right arm pain 04/12/2015   RLS (restless legs syndrome) 04/02/2016   Tinnitus of both ears 04/13/2020   Type 2 diabetes mellitus with obesity (HCC) 08/04/2019   Last Assessment & Plan:   Formatting of this note might be different from the original.  Agree with current plan currently awaiting cultures  IMO SNOMED Dx Update Oct 2024     Type II diabetes mellitus (HCC)    followed by pcp   Unstable angina (HCC) 12/26/2021   Vitamin D deficiency    Wears glasses    Past Surgical History:  Procedure Laterality Date   BLADDER SUSPENSION  2006   CARPAL TUNNEL RELEASE Bilateral right 07/22/2001;  left 01-05-2003  dr beverley @[facility]    CHOLECYSTECTOMY     COLONOSCOPY W/ POLYPECTOMY     CORONARY PRESSURE/FFR STUDY N/A 12/26/2021   Procedure: INTRAVASCULAR PRESSURE WIRE/FFR STUDY;  Surgeon: Verlin Lonni BIRCH, MD;  Location: [facility];  Service: Cardiovascular;  Laterality: N/A;   CORONARY STENT INTERVENTION N/A 12/26/2021   Procedure: CORONARY STENT INTERVENTION;  Surgeon: Verlin Lonni BIRCH, MD;  Location: [facility];  Service: Cardiovascular;  Laterality:  N/A;   DILATION AND CURETTAGE OF UTERUS     LAPAROSCOPIC CHOLECYSTECTOMY  1992   LEFT HEART CATH AND CORONARY ANGIOGRAPHY N/A 12/26/2021   Procedure: LEFT HEART CATH AND CORONARY ANGIOGRAPHY;  Surgeon: Verlin Lonni BIRCH, MD;  Location: [facility];  Service: Cardiovascular;  Laterality: N/A;   LUMBAR LAMINECTOMY  1997   L4 --5   MULTIPLE EXTRACTIONS WITH ALVEOLOPLASTY N/A 11/26/2012   Procedure: MULTIPLE EXTRACTION WITH ALVEOLOPLASTY;  Surgeon: Glendia CHRISTELLA Primrose, DDS;  Location: [facility];  Service: Oral Surgery;  Laterality: N/A;   SHOULDER ARTHROSCOPY WITH ROTATOR CUFF REPAIR AND SUBACROMIAL DECOMPRESSION Left 08/11/2018   Procedure: SHOULDER ARTHROSCOPY WITH ROTATOR CUFF REPAIR AND SUBACROMIAL DECOMPRESSION and DISTAL CLAVICLE ,DEBRIDEMENT ANTERIOR LABRIL TEAR;  Surgeon: Yvone Rush,  MD;  Location: [facility];  Service: Orthopedics;  Laterality: Left;   TONSILLECTOMY  age 6   TOTAL HIP ARTHROPLASTY Left 07/10/2014   Procedure: TOTAL HIP ARTHROPLASTY ANTERIOR APPROACH;  Surgeon: Norleen Gavel, MD;  Location: [facility];  Service: Orthopedics;  Laterality: Left;   TOTAL HIP ARTHROPLASTY Right 10/20/2022   Procedure: TOTAL HIP ARTHROPLASTY ANTERIOR APPROACH;  Surgeon: Gavel Norleen, MD;  Location: [facility];  Service: Orthopedics;  Laterality: Right;   TRIGGER FINGER RELEASE Right 2018   thumb   TRIGGER FINGER RELEASE Left 10/22/2015   Procedure: LEFT TRIGGER THUMB RELEASE;  Surgeon: Alm Hummer, MD;  Location: [facility];  Service: Orthopedics;  Laterality: Left;   TUBAL LIGATION  yrs ago   ULNAR NERVE TRANSPOSITION Right 03/27/2014   Procedure: RIGHT ULNAR NEUROPLASTY AT THE ELBOW;  Surgeon: Alm DELENA Hummer, MD;  Location: [facility];  Service: Orthopedics;  Laterality: Right;   ULNAR NERVE TRANSPOSITION Left 12/08/2016   Procedure: LEFT ULNAR NEUROPLASTY AT THE ELBOW;  Surgeon: Hummer Alm, MD;  Location: [facility];  Service:  Orthopedics;  Laterality: Left;   UPPER GASTROINTESTINAL ENDOSCOPY     VAGINAL HYSTERECTOMY  2006   w/  ANTERIOR AND POSTERIOR REPAIR WITH SLING SUSPENSION   Allergies  Allergen Reactions   Adhesive [Tape] Hives   Latex Hives   Dilaudid  [Hydromorphone ] Hives   Oxycodone  Itching   PHYSICAL EXAM: General: The patient is alert and oriented x3 in no acute distress.  Dermatology: Skin is warm, dry and supple bilateral lower extremities. Interspaces are clear of maceration and debris.  No rashes noted.   Vascular: Palpable pedal pulses bilaterally. Capillary refill within normal limits.  No appreciable edema.  No erythema or calor.  Neurological: Light touch sensation grossly intact bilateral feet.   Musculoskeletal Exam: Pain on palpation to the plantar-lateral and plantar-central aspect of the right heel.  No ecchymosis or erythema is noted.  Ankle dorsiflexion is less than 10 degrees with the knee extended.  No palpable gaps or nodules within the plantar fascia.  No Achilles tendon pain.  Negative Tinel's sign of the posterior tibial nerve.  ASSESSMENT/PLAN OF CARE: 1. Plantar fasciitis of right foot    Discussed patient's condition and possible etiologies today.  Discussed conservative treatment options with patient today, including shoe modification / arch supports, aperture pads, cortisone injection, NSAID topical / oral therapy, and toe splints and shields.  Briefly discussed surgical intervention if conservative options are not successful.    A second corticosteroid injection was administered to the right heel today, using a lateral approach.  This consisted of a mixture of 1% lidocaine  plain, 0.5% Marcaine  plain and Kenalog 10 for total of 1.25 cc administered.  She tolerated this well and a Band-Aid was applied.  She will remove the Band-Aid later tonight.  Pain postinjection was 5/10.  Offered a referral to physical therapy -> patient declined.  Patient states that she will try  to wear the night splint and a plantar fascia brace more often.  Continue with stretching exercises  Patient to call in about 2 weeks for a progress report.  If she still having no improvement, or worsening of pain, we will order an MRI of the right foot.  Awanda CHARM Imperial, DPM, FACFAS [facility]     [address]  [place], [place] [address]

## 2023-12-01 ENCOUNTER — Telehealth: Payer: Self-pay | Admitting: Cardiology

## 2023-12-01 NOTE — Telephone Encounter (Signed)
 Pt states she has been having nose bleeds all week and would like a c/b from a nurse please advise

## 2023-12-01 NOTE — Telephone Encounter (Signed)
 Pt reported having increased nose bleeds. Advised to check with PCP for further assistance and referral to ENT if needed.

## 2023-12-14 NOTE — Progress Notes (Signed)
 " Cardiology Office Note:  .   Date:  12/16/2023  ID:  [name], DOB 1960-03-01, MRN [id number] PCP: Orlando Dwayne NOVAK, FNP  [facility] Providers Cardiologist:  Lamar Fitch, MD    History of Present Illness: [name] is a 63 y.o. female with a past medical history of hypertension, CAD s/p PTCA and DES to mLAD 2023, OSA, COPD, GERD, IBS, DM2, hypothyroidism, fibromyalgia, dyslipidemia, former tobacco abuse.  12/27/2021 severe calcified M LAD stenosis PTCA/DES x 1 to M LAD, PTCA with balloon angioplasty to the ostium of the diagonal branch. 12/17/2021 Lexiscan  findings consistent with ischemia, intermediate risk 09/21/2021 echo EF 55 to 60%, impaired relaxation, trivial MR, mild aortic sclerosis without stenosis  She initially established care with Dr. Fitch in 2023 at the behest of her PCP for preop evaluation.  An echocardiogram was arranged revealing a normal EF, impaired relaxation, mild aortic sclerosis without stenosis.  She underwent a Lexiscan  which was consistent with ischemia, intermediate risk >> she underwent left heart cath on 12/27/2021 revealing severe calcified mid LAD stenosis s/p PTCA and DES x 1 and PTCA to the ostium of the diagonal branch.  Evaluated by Dr. Fitch in March 2024, stable from a cardiac perspective advised follow-up in 6 months.  Most recently she was evaluated by Dr. Fitch on 06/17/2023, optimized from a cardiac perspective, some shortness of breath at time but this was overall unchanged for her, Zetia  was added for optimal lipid support and she was advised to follow-up in 6 months.  She presents today for follow-up.  Is doing well overall, no formal complaints from a cardiac perspective.  She did have a few episodes recently of a nosebleed, she held her aspirin  for a few days but has subsequently resumed.  She continues to work on weight loss, and is down to 175 pounds, at her highest she was over 240  pounds.  Her A1c is very well-controlled at 6.0%, lipids are well-controlled. She denies chest pain, palpitations, dyspnea, pnd, orthopnea, n, v, dizziness, syncope, edema, weight gain, or early satiety.   ROS: Review of Systems  All other systems reviewed and are negative.    Studies Reviewed: SABRA   EKG Interpretation Date/Time:  Wednesday December 16 2023 10:40:18 EST Ventricular Rate:  85 PR Interval:  128 QRS Duration:  80 QT Interval:  394 QTC Calculation: 468 R Axis:   102  Text Interpretation: Normal sinus rhythm Rightward axis When compared with ECG of 26-Dec-2021 14:30, No significant change was found Confirmed by Carlin Nest [phone]) on 12/16/2023 10:54:37 AM    Cardiac Studies & Procedures   ______________________________________________________________________________________________ CARDIAC CATHETERIZATION  CARDIAC CATHETERIZATION 12/26/2021  Conclusion   Prox RCA to Dist RCA lesion is 20% stenosed.   Prox Cx to Mid Cx lesion is 30% stenosed.   2nd Mrg lesion is 30% stenosed.   Mid Cx to Dist Cx lesion is 40% stenosed.   Mid LAD lesion is 90% stenosed.   2nd Diag lesion is 60% stenosed.   A drug-eluting stent was successfully placed using a SYNERGY XD 2.25X20.   Balloon angioplasty was performed using a BALLN EMERGE MR 2.0X12.   Post intervention, there is a 50% residual stenosis.   Post intervention, there is a 0% residual stenosis.  Severe, calcified mid LAD stenosis. Moderate ostial diagonal stenosis arising just above the LAD lesion. Successful PTCA/DES x 1 mid LAD Successful PTCA with balloon angioplasty ostium of the Diagonal branch Mild to moderate Circumflex/OM disease The  RCA is heavily calcified throughout with minimal obstructive disease. Normal LV filling pressures  Recommendations: Same day post PCI discharge. DAPT with ASA and Plavix  for at least six months. Continue statin. Smoking cessation is advised.  Findings Coronary Findings Diagnostic   Dominance: Right  Left Anterior Descending Vessel is large. Mid LAD lesion is 90% stenosed. The lesion is calcified.  Second Diagonal Branch Vessel is moderate in size. 2nd Diag lesion is 60% stenosed.  Left Circumflex Vessel is large. Prox Cx to Mid Cx lesion is 30% stenosed. The lesion is calcified. Mid Cx to Dist Cx lesion is 40% stenosed.  First Obtuse Marginal Branch Vessel is small in size.  Second Obtuse Marginal Branch Vessel is moderate in size. 2nd Mrg lesion is 30% stenosed.  Right Coronary Artery Vessel is large. Prox RCA to Dist RCA lesion is 20% stenosed. The lesion is calcified.  Intervention  Mid LAD lesion Stent CATH VISTA GUIDE 6FR XBLAD3.5 guide catheter was inserted. Lesion crossed with guidewire using a GUIDEWIRE PRESSURE X 175. Pre-stent angioplasty was performed using a BALLN SAPPHIRE 2.0X15. A drug-eluting stent was successfully placed using a SYNERGY XD 2.25X20. Stent strut is well apposed. Post-stent angioplasty was performed using a BALL SAPPHIRE NC24 2.50X15. Post-Intervention Lesion Assessment The intervention was successful. Pre-interventional TIMI flow is 3. Post-intervention TIMI flow is 3. No complications occurred at this lesion. There is a 0% residual stenosis post intervention.  2nd Diag lesion Angioplasty Balloon angioplasty was performed using a BALLN EMERGE MR 2.0X12. Post-Intervention Lesion Assessment The intervention was successful. Pre-interventional TIMI flow is 3. Post-intervention TIMI flow is 3. No complications occurred at this lesion. There is a 50% residual stenosis post intervention.   STRESS TESTS  MYOCARDIAL PERFUSION IMAGING 12/17/2021  Interpretation Summary   Findings are consistent with ischemia. The study is intermediate risk.   LV perfusion is abnormal. Defect 1: There is a medium defect with moderate reduction in uptake present in the anterior location(s) that is reversible.   Left ventricular function is  normal. Nuclear stress EF: 52 %. End diastolic cavity size is normal.   Prior study not available for comparison.            ______________________________________________________________________________________________      Risk Assessment/Calculations:             Physical Exam:   VS:  BP (!) 100/52   Pulse 85   Ht 5' 6 (1.676 m)   Wt 175 lb 6.4 oz (79.6 kg)   SpO2 99%   BMI 28.31 kg/m    Wt Readings from Last 3 Encounters:  12/16/23 175 lb 6.4 oz (79.6 kg)  06/17/23 205 lb (93 kg)  02/12/23 209 lb (94.8 kg)    GEN: Well nourished, well developed in no acute distress NECK: No JVD; No carotid bruits CARDIAC: RRR, no murmurs, rubs, gallops RESPIRATORY:  Clear to auscultation without rales, wheezing or rhonchi  ABDOMEN: Soft, non-tender, non-distended EXTREMITIES:  No edema; No deformity   ASSESSMENT AND PLAN: .   Coronary artery disease-s/p left heart cath in December 2023 severe calcified M LAD stenosis PTCA/DES x 1 to M LAD, PTCA with balloon angioplasty to the ostium of the diagonal branch.  No indication for ischemic evaluation.  Continue aspirin  81 mg daily, continue Lipitor 80 mg daily, continue nitroglycerin  as needed- has not needed. Heart healthy diet and regular cardiovascular exercise encouraged which she is adhering to and continues to lose weight!    COPD/OSA-follows with pulmonology.  Dyslipidemia-most recent LDL  was controlled at 60 on 11/26/23, this appears to be monitored by her PCP and is excellently controlled, continue Lipitor 80 mg daily.  We did discuss checking LP(a) however she politely declines at this time.  Former tobacco abuse-complete cessation for many years.  DM2 - most recent A1C excellently controlled at 6%.         Dispo:  1 year Dr. Krasowski.    Signed, Delon JAYSON Hoover, NP  "

## 2023-12-16 ENCOUNTER — Encounter: Payer: Self-pay | Admitting: Cardiology

## 2023-12-16 ENCOUNTER — Ambulatory Visit: Attending: Cardiology | Admitting: Cardiology

## 2023-12-16 VITALS — BP 100/52 | HR 85 | Ht 66.0 in | Wt 175.4 lb

## 2023-12-16 DIAGNOSIS — I2510 Atherosclerotic heart disease of native coronary artery without angina pectoris: Secondary | ICD-10-CM | POA: Diagnosis present

## 2023-12-16 DIAGNOSIS — Z955 Presence of coronary angioplasty implant and graft: Secondary | ICD-10-CM

## 2023-12-16 DIAGNOSIS — J449 Chronic obstructive pulmonary disease, unspecified: Secondary | ICD-10-CM

## 2023-12-16 DIAGNOSIS — I10 Essential (primary) hypertension: Secondary | ICD-10-CM

## 2023-12-16 DIAGNOSIS — G4733 Obstructive sleep apnea (adult) (pediatric): Secondary | ICD-10-CM | POA: Diagnosis present

## 2023-12-16 NOTE — Patient Instructions (Signed)
 Medication Instructions:   No changes   *If you need a refill on your cardiac medications before your next appointment, please call your pharmacy*  Lab Work:  None   If you have labs (blood work) drawn today and your tests are completely normal, you will receive your results only by: MyChart Message (if you have MyChart) OR A paper copy in the mail If you have any lab test that is abnormal or we need to change your treatment, we will call you to review the results.  Testing/Procedures:  None  Follow-Up: At [facility], you and your health needs are our priority.  As part of our continuing mission to provide you with exceptional heart care, our providers are all part of one team.  This team includes your primary Cardiologist (physician) and Advanced Practice Providers or APPs (Physician Assistants and Nurse Practitioners) who all work together to provide you with the care you need, when you need it.  Your next appointment:   1 year(s)  Provider:   Lamar Fitch, MD    We recommend signing up for the patient portal called MyChart.  Sign up information is provided on this After Visit Summary.  MyChart is used to connect with patients for Virtual Visits (Telemedicine).  Patients are able to view lab/test results, encounter notes, upcoming appointments, etc.  Non-urgent messages can be sent to your provider as well.   To learn more about what you can do with MyChart, go to [email or web address].   Other Instructions Keep up the excellent work!!!  All of your numbers are looking great. Drink more water  and stay active!! Have a blessed year.

## 2024-03-02 ENCOUNTER — Ambulatory Visit: Admitting: Podiatry

## 2024-03-04 ENCOUNTER — Ambulatory Visit: Admitting: Podiatry

## 2024-03-18 ENCOUNTER — Ambulatory Visit: Admitting: Podiatry
